# Patient Record
Sex: Male | Born: 1995 | Race: White | Hispanic: No | Marital: Single | Smoking: Never smoker
Health system: Southern US, Community
[De-identification: ages and names within clinical notes are randomized; demographics above are authoritative.]

## PROBLEM LIST (undated history)

## (undated) DIAGNOSIS — F419 Anxiety disorder, unspecified: Secondary | ICD-10-CM

## (undated) DIAGNOSIS — F209 Schizophrenia, unspecified: Secondary | ICD-10-CM

## (undated) DIAGNOSIS — R7989 Other specified abnormal findings of blood chemistry: Secondary | ICD-10-CM

## (undated) DIAGNOSIS — F200 Paranoid schizophrenia: Secondary | ICD-10-CM

## (undated) HISTORY — DX: Schizophrenia, unspecified: F20.9

---

## 2016-10-05 HISTORY — PX: LASIK: SHX215

## 2020-08-22 ENCOUNTER — Ambulatory Visit: Payer: 59 | Admitting: Adult Health

## 2021-01-14 ENCOUNTER — Other Ambulatory Visit: Payer: Self-pay

## 2021-01-14 ENCOUNTER — Encounter: Payer: Self-pay | Admitting: Internal Medicine

## 2021-01-14 ENCOUNTER — Ambulatory Visit (INDEPENDENT_AMBULATORY_CARE_PROVIDER_SITE_OTHER): Payer: 59 | Admitting: Internal Medicine

## 2021-01-14 VITALS — BP 144/94 | HR 91 | Ht 75.0 in | Wt 362.1 lb

## 2021-01-14 DIAGNOSIS — E291 Testicular hypofunction: Secondary | ICD-10-CM

## 2021-01-14 NOTE — Progress Notes (Signed)
Name: Matthew Padilla  MRN/ DOB: [id number], January 09, 1996    Age/ Sex: 25 y.o., male    PCP: Pcp, No   Reason for Endocrinology Evaluation: Hypogonadism      Date of Initial Endocrinology Evaluation: 01/14/2021     HPI: [name] is a 25 y.o. male with a past medical history of Depression . The patient presented for initial endocrinology clinic visit on 01/14/2021 for consultative assistance with his hypogonadism .    He presented to his PCP in 10/2020  For evaluation of severe depression, lack of motivation and mood changes. Mother asked for evaluation for klinefelter syndrome  Which prompted lab work showing normal TSH 1.98 uIU/mL , low total testosterone at 204 ng/dL ( reference  [id number])  but normal free testosterone 14.4 pg/dL ( reference 9.4-70.9) normal LH and FSH at 6.7 and 1.5 respectively.    Of note the pt was first noted with depression when he entered [place] [employer] at age 51 which resulted in expelling him due to the fact that he stopped going to classes. He was not treated for depression until a couple years later when he was admitted for in-patient facility, pt was non compliant with anti depressants after discharge.     Moved from [place] ~ 6 months ago  No learning difficulties  He went through puberty at age 67  He has never been sexually Denies erections No masturbation in 6 months, due to low libido    He does not grow as much facial hair , stable  Has a questionable decrease in testicular size   Weight gain has been consistent through childhood around 4-5th grade  No illicit drugs  Has tobacco use or marijuana use    Has noted decrease in eye sight and headaches   Mother had to go through infertility   Has two sisters   HISTORY:  Past Medical History: No past medical history on file. Past Surgical History:   Social History:  reports that he has never smoked. He has never used smokeless tobacco. He reports previous alcohol use. Family  History: family history is not on file.   HOME MEDICATIONS: Allergies as of 01/14/2021   No Known Allergies     Medication List    as of January 14, 2021  2:20 PM   You have not been prescribed any medications.       REVIEW OF SYSTEMS: A comprehensive ROS was conducted with the patient and is negative except as per HPI     OBJECTIVE:  VS: BP (!) 144/94   Pulse 91   Ht 6\' 3"  (1.905 m)   Wt (!) 362 lb 2 oz (164.3 kg)   SpO2 98%   BMI 45.26 kg/m     Wt Readings from Last 3 Encounters:  01/14/21 (!) 362 lb 2 oz (164.3 kg)     EXAM: General: Pt appears well and is in NAD  Neck: General: Supple without adenopathy. Thyroid: Thyroid size normal.  No goiter or nodules appreciated.  Chest :  B/L glandular breast tissue ~ 2 cm in diameter   Lungs: Clear with good BS bilat with no rales, rhonchi, or wheezes  Heart: Auscultation: RRR.  Abdomen: Normoactive bowel sounds, soft, nontender, without masses or organomegaly palpable  Genital : Testicular size 20 mL bilaterall  Extremities:  BL LE: No pretibial edema normal ROM and strength.  Skin: Hair: Texture and amount normal with gender appropriate distribution Skin Inspection: No rashes,but has pale violet lateral abdominal  striae  Skin Palpation: Skin temperature, texture, and thickness normal to palpation  Neuro:  DTRs: 2+ and symmetric in UE without delay in relaxation phase  Mental Status: Judgment, insight: Intact Orientation: Oriented to time, place, and person Memory: Intact for recent and remote events Mood and affect: No depression, anxiety, or agitation     DATA REVIEWED: Labs pending    ASSESSMENT/PLAN/RECOMMENDATIONS:   Low total serum testosterone:  -Patient with low total serum testosterone at PCPs office but with normal free testosterone.  One of the explanations of low total testosterone is obesity, that would cause low sex hormone binding globulin which ultimately causes a low total serum  testosterone. -I will proceed with checking free testosterone as this is the best way of testing testosterone levels and obesity -I have encouraged the patient to lose weight as that by itself will improve  hormonal imbalances -I am also going to check prolactin level as well as TFTs, LH -They had a concern about Klinefelter syndrome, I did explain to them that the patient does not meet criteria for Klinefelter syndrome as his labs do not show primary hypogonadism and his testicular exam is normal without evidence of fibrosis. -He will come back for fasting 8 AM lab     2. Depression :   -This is a major issue that has been ongoing for years, I have strongly encouraged the patient and mother to seek professional help with depression.   Follow-up in 3 months  Signed electronically by: Lyndle Herrlich, MD  [facility] Endocrinology  [facility] [address] [place], [place] [address] Phone: [phone] FAX: [phone]   CC: Pcp, No No address on file Phone: None Fax: None   Return to Endocrinology clinic as below: No future appointments.

## 2021-01-14 NOTE — Patient Instructions (Signed)
-   Please stop by the lab at 8 AM ( fasting )

## 2021-01-15 DIAGNOSIS — E291 Testicular hypofunction: Secondary | ICD-10-CM | POA: Insufficient documentation

## 2021-01-16 ENCOUNTER — Other Ambulatory Visit: Payer: 59

## 2021-04-22 ENCOUNTER — Ambulatory Visit: Payer: 59 | Admitting: Internal Medicine

## 2021-04-22 NOTE — Progress Notes (Deleted)
   Name: Matthew Padilla  MRN/ DOB: [id number], 31-May-1996    Age/ Sex: 25 y.o., male     PCP: Pcp, No   Reason for Endocrinology Evaluation: ***     Initial Endocrinology Clinic Visit: ***    PATIENT IDENTIFIER: [name] is a 25 y.o., male with a past medical history of ***. He has followed with [facility] since *** for consultative assistance with management of his ***.   HISTORICAL SUMMARY: The patient was first diagnosed with *** at ***, in the setting of ***. Since that time, ***.    SUBJECTIVE:   During last visit (***): ***  Today (04/22/2021):  Mr. Hopes is here for ****   ROS:  As per HPI.   HISTORY:  Past Medical History: No past medical history on file. Past Surgical History: *** The histories are not reviewed yet. Please review them in the "History" navigator section and refresh this SmartLink. Social History:  reports that he has never smoked. He has never used smokeless tobacco. He reports previous alcohol use. No history on file for drug use. Family History: No family history on file.   HOME MEDICATIONS: Allergies as of 04/22/2021   No Known Allergies      Medication List    as of April 22, 2021 11:33 AM   You have not been prescribed any medications.       OBJECTIVE:   PHYSICAL EXAM: VS: There were no vitals taken for this visit.   EXAM: General: Pt appears well and is in NAD  Hydration: Well-hydrated with moist mucous membranes and good skin turgor  Eyes: External eye exam normal without stare, lid lag or exophthalmos.  EOM intact.  PERRL.  Ears, Nose, Throat: Hearing: Grossly intact bilaterally Dental: Good dentition  Throat: Clear without mass, erythema or exudate  Neck: General: Supple without adenopathy. Thyroid: Thyroid size normal.  No goiter or nodules appreciated. No thyroid bruit.  Lungs: Clear with good BS bilat with no rales, rhonchi, or wheezes  Heart: Auscultation: RRR.  Abdomen: Normoactive  bowel sounds, soft, nontender, without masses or organomegaly palpable  Extremities: Gait and station: Normal gait  Digits and nails: No clubbing, cyanosis, petechiae, or nodes Head and neck: Normal alignment and mobility BL UE: Normal ROM and strength. BL LE: No pretibial edema normal ROM and strength.  Skin: Hair: Texture and amount normal with gender appropriate distribution Skin Inspection: No rashes, acanthosis nigricans/skin tags. No lipohypertrophy Skin Palpation: Skin temperature, texture, and thickness normal to palpation  Neuro: Cranial nerves: II - XII grossly intact  Cerebellar: Normal coordination and movement; no tremor Motor: Normal strength throughout DTRs: 2+ and symmetric in UE without delay in relaxation phase  Mental Status: Judgment, insight: Intact Orientation: Oriented to time, place, and person Memory: Intact for recent and remote events Mood and affect: No depression, anxiety, or agitation     DATA REVIEWED: ***    ASSESSMENT / PLAN / RECOMMENDATIONS:   ***  Plan: ***    Medications   ***   Signed electronically by: Lyndle Herrlich, MD  [facility] Endocrinology  [facility] [address] [place] [place], [place] [address] Phone: [phone] FAX: [phone]      CC: Pcp, No No address on file Phone: None  Fax: None   Return to Endocrinology clinic as below: Future Appointments  Date Time Provider Department Center  04/22/2021  1:40 PM Aevah Stansbery, Konrad Dolores, MD [facility]

## 2021-07-08 ENCOUNTER — Emergency Department (HOSPITAL_COMMUNITY): Payer: 59

## 2021-07-08 ENCOUNTER — Emergency Department (HOSPITAL_COMMUNITY)
Admission: EM | Admit: 2021-07-08 | Discharge: 2021-07-09 | Disposition: A | Discharge status: Other Acute Inpatient Hospital | Payer: 59 | Attending: Emergency Medicine | Admitting: Emergency Medicine

## 2021-07-08 ENCOUNTER — Other Ambulatory Visit: Payer: Self-pay

## 2021-07-08 DIAGNOSIS — F209 Schizophrenia, unspecified: Secondary | ICD-10-CM | POA: Diagnosis not present

## 2021-07-08 DIAGNOSIS — R4182 Altered mental status, unspecified: Secondary | ICD-10-CM | POA: Insufficient documentation

## 2021-07-08 DIAGNOSIS — Z046 Encounter for general psychiatric examination, requested by authority: Secondary | ICD-10-CM | POA: Diagnosis present

## 2021-07-08 DIAGNOSIS — Y900 Blood alcohol level of less than 20 mg/100 ml: Secondary | ICD-10-CM | POA: Insufficient documentation

## 2021-07-08 DIAGNOSIS — Z20822 Contact with and (suspected) exposure to covid-19: Secondary | ICD-10-CM | POA: Insufficient documentation

## 2021-07-08 DIAGNOSIS — Z008 Encounter for other general examination: Secondary | ICD-10-CM

## 2021-07-08 LAB — RESP PANEL BY RT-PCR (FLU A&B, COVID) ARPGX2
Influenza A by PCR: NEGATIVE
Influenza B by PCR: NEGATIVE
SARS Coronavirus 2 by RT PCR: NEGATIVE

## 2021-07-08 LAB — COMPREHENSIVE METABOLIC PANEL
ALT: 54 U/L — ABNORMAL HIGH (ref 0–44)
AST: 50 U/L — ABNORMAL HIGH (ref 15–41)
Albumin: 4.8 g/dL (ref 3.5–5.0)
Alkaline Phosphatase: 53 U/L (ref 38–126)
Anion gap: 11 (ref 5–15)
BUN: 14 mg/dL (ref 6–20)
CO2: 25 mmol/L (ref 22–32)
Calcium: 9.5 mg/dL (ref 8.9–10.3)
Chloride: 105 mmol/L (ref 98–111)
Creatinine, Ser: 0.87 mg/dL (ref 0.61–1.24)
GFR, Estimated: >60 mL/min (ref >60)
Glucose, Bld: 80 mg/dL (ref 70–99)
Potassium: 3.6 mmol/L (ref 3.5–5.1)
Sodium: 141 mmol/L (ref 135–145)
Total Bilirubin: 1.7 mg/dL — ABNORMAL HIGH (ref 0.3–1.2)
Total Protein: 7.7 g/dL (ref 6.5–8.1)

## 2021-07-08 LAB — CBC WITH DIFFERENTIAL/PLATELET
Abs Immature Granulocytes: 0.04 10*3/uL (ref 0.00–0.07)
Basophils Absolute: 0.1 10*3/uL (ref 0.0–0.1)
Basophils Relative: 0 %
Eosinophils Absolute: 0 10*3/uL (ref 0.0–0.5)
Eosinophils Relative: 0 %
HCT: 44.9 % (ref 39.0–52.0)
Hemoglobin: 14.3 g/dL (ref 13.0–17.0)
Immature Granulocytes: 0 %
Lymphocytes Relative: 18 %
Lymphs Abs: 2 10*3/uL (ref 0.7–4.0)
MCH: 24.9 pg — ABNORMAL LOW (ref 26.0–34.0)
MCHC: 31.8 g/dL (ref 30.0–36.0)
MCV: 78.1 fL — ABNORMAL LOW (ref 80.0–100.0)
Monocytes Absolute: 0.7 10*3/uL (ref 0.1–1.0)
Monocytes Relative: 6 %
Neutro Abs: 8.6 10*3/uL — ABNORMAL HIGH (ref 1.7–7.7)
Neutrophils Relative %: 76 %
Platelets: 280 10*3/uL (ref 150–400)
RBC: 5.75 MIL/uL (ref 4.22–5.81)
RDW: 15.4 % (ref 11.5–15.5)
WBC: 11.4 10*3/uL — ABNORMAL HIGH (ref 4.0–10.5)
nRBC: 0 % (ref 0.0–0.2)

## 2021-07-08 LAB — ETHANOL: Alcohol, Ethyl (B): <10 mg/dL (ref <10)

## 2021-07-08 MED ORDER — LORAZEPAM 2 MG/ML IJ SOLN
2.0000 mg | Freq: Once | INTRAMUSCULAR | Status: AC
  Administered 2021-07-08: 2 mg via INTRAMUSCULAR
  Filled 2021-07-08: qty 1

## 2021-07-08 MED ORDER — OLANZAPINE 10 MG IM SOLR
10.0000 mg | Freq: Once | INTRAMUSCULAR | Status: AC
  Administered 2021-07-08: 10 mg via INTRAMUSCULAR
  Filled 2021-07-08: qty 10

## 2021-07-08 NOTE — BH Assessment (Addendum)
Comprehensive Clinical Assessment (CCA) Note  07/08/2021 Matthew Padilla [id number]  Disposition: TTS completed. Per Pecolia Ades, NP, patient meets criteria for inpatient psychiatric treatment. Disposition Counselor/LCSW will seek appropriate bed placement. [place]-SUICIDE SEVERITY RATING SCALE (C-SSRS) completed and patient scored, "No Risk".   [facility] ED from 07/08/2021 in [facility] DEPT  C-SSRS RISK CATEGORY No Risk      The patient demonstrates the following risk factors for suicide: Chronic risk factors for suicide include: psychiatric disorder of Schizophrenia . Acute risk factors for suicide include:  unknown due to mental state; patient unable to provide appropriate answers . Protective factors for this patient include:  none reported . Considering these factors, the overall suicide risk at this point appears to be "No Risk". Patient is not appropriate for outpatient follow up.   Chief Complaint:  Chief Complaint  Patient presents with   Medical Clearance   Psychiatric Evaluation   Visit Diagnosis: Schizophrenia    Matthew Padilla is a 25 y/o male that presents to [facility]. Tele assessment order was placed. However, prior to assessing the patient clinician reviewed ED notes which indicated:   "Matthew Padilla is a 25 y.o. male.  With previous diagnosis of schizophrenia in 2019 who presents to the emergency department for medical clearance. Unable to obtain any information from the patient.  Per officers at bedside patient was found roaming around the neighborhood and pulling on neighbors doors.  Apparently a neighbor came home with her daughter and found the patient inside of the house standing in the living room.  She called 911.  Apparently the patient lives down the street and the mother was home at the time. Per Felicita Gage, please officer, he spoke with the mother who states that he was diagnosed with schizophrenia in 2019.  He is not  currently on any medication.  He apparently has not been aggressive recently, but has been aggressive once in [place] where they are from.  He is only ever taken antidepressants before previously.  Officer states that while they were on scene he continue to try and open other police car doors and saying "voices were talking to him". On my exam the patient is pacing in the room and is laughing.  He does not answer most of my questions.  He only shakes his head at me.  He does state that he is not in any pain at this time.  He states that he is not suicidal or homicidal.  He does not answer when I ask him about auditory or visual hallucinations however he does appear to be responding to internal stimuli in the room.  He continues to open and close the door throughout the exam.  He is otherwise not in any acute distress."  Clinician met with patient via tele assessment. Initially patient refused to speak only responding to this Clinician with shoulder shrugs. He was encouraged to participate in the assessment several times and eventually engaged in conversation. Patient responded to questions with inappropriate responses, "Yes", and "No's". His presentation is bizzare. He appears restless as he observed pacing the room.   Patient asked about his current living arrangements and states: "I would never believe but me 2 sisters".  Also, that he has a "Fake orange doctor I never trusted". Patient asked about current suicidal ideations and indicates, "If I did I would have failed".  Current stress, "My train leaving [place] and ending up in [place]". Denies self-mutilating behaviors. He says that his auditory hallucinations sound like  a "motel". Clinician was unable to make sense of patients responses in relation to the questions asked.    Patient did not confirm SI, HI, and/or AVH's. Also, did not confirm any related alcohol and/or drug use.   CCA Screening, Triage and Referral (STR)  Patient Reported  Information How did you hear about [place]? Family/Friend  What Is the Reason for Your Visit/Call Today? Matthew Padilla is a 25 y.o. male.  With previous diagnosis of schizophrenia in 2019 who presents to the emergency department for medical clearance.   Unable to obtain any information from the patient.  Per officers at bedside patient was found roaming around the neighborhood and pulling on neighbors' doors.  Apparently, a neighbor came home with her daughter and found the patient inside of the house standing in the living room.  She called 911.  Apparently, the patient lives down the street and the mother were home at the time.  Per Felicita Gage, please officer, he spoke with the mother who states that he was diagnosed with schizophrenia in 2019.  He is not currently on any medication.  He apparently has not been aggressive recently, but has been aggressive once in [place] where they are from.  He is only ever taken antidepressants before previously.  Officer states that while they were on scene he continues to try and open other police car doors and saying "voices were talking to him".   On my exam the patient is pacing in the room and is laughing.  He does not answer any my questions.  He only shakes his head at me.  He does state that he is not in any pain currently.  He states that he is not suicidal or homicidal.  He does not answer when I ask him about auditory or visual hallucinations however he does appear to be responding to internal stimuli in the room.  He continues to open and close the door throughout the exam.  He is otherwise not in any acute distress.  I would never believe it and 2 sisters.  Fake orange doctor I never trusted. If I did I would have failed. Stress: train leaving [place] and ending up in [place]. Denies self-mutilating behaviors. I send them to [place] to know they were to early when asked about family hx. Auditory- the motel. Alcohol and drug use only when the elevation is to high.  Hobby drive down to [place], [place] not when I'm there.   Highest level of education-just walking up the stairs; support system - left side.   The [place], [place]. August in [place], wedding, progressed worse. Not communicating, Not answering questions, looking for work, "rewire his brain". 2019, more depression and suicidal. No hx of any suicide attempts and/gestures.    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 Ordered gummies last week from the internet.  Moved September 11.  How Long Has This Been Causing You Problems? > than 6 months  What Do You Feel Would Help You the Most Today? Treatment for Depression or other mood problem   Have You Recently Had Any Thoughts About Hurting Yourself? No  Are You Planning to Commit Suicide/Harm Yourself At This time? No   Have you Recently Had Thoughts About [place]? No  Are You Planning to Harm Someone at This Time? No  Explanation: No data recorded  Have You Used Any Alcohol or Drugs in the Past 24 Hours? No  How Long Ago Did You Use Drugs or Alcohol? No data recorded  What Did You Use and How Much? No data recorded  Do You Currently Have a Therapist/Psychiatrist? No  Name of Therapist/Psychiatrist: No data recorded  Have You Been Recently Discharged From Any Office Practice or Programs? No  Explanation of Discharge From Practice/Program: No data recorded    CCA Screening Triage Referral Assessment Type of Contact: Tele-Assessment  Telemedicine Service Delivery:   Is this Initial or Reassessment? Initial Assessment  Date Telepsych consult ordered in [facility]:  07/08/21  Time Telepsych consult ordered in [facility]:  No data recorded Location of Assessment: [facility] ED  Provider Location: [facility] Assessment Services   Collateral Involvement: No data recorded  Does Patient Have a Court Appointed Legal Guardian? No data recorded Name and Contact of Legal Guardian: No data recorded If Minor and Not Living with Parent(s), Who has Custody? No  data recorded Is CPS involved or ever been involved? Never  Is APS involved or ever been involved? Never   Patient Determined To Be At Risk for Harm To Self or Others Based on Review of Patient Reported Information or Presenting Complaint? No  Method: No data recorded Availability of Means: No data recorded Intent: No data recorded Notification Required: No data recorded Additional Information for Danger to Others Potential: No data recorded Additional Comments for Danger to Others Potential: No data recorded Are There Guns or Other Weapons in Your Home? No data recorded Types of Guns/Weapons: No data recorded Are These Weapons Safely Secured?                            No data recorded Who Could Verify You Are Able To Have These Secured: No data recorded Do You Have any Outstanding Charges, Pending Court Dates, Parole/Probation? No data recorded Contacted To Inform of Risk of Harm To Self or Others: No data recorded   Does Patient Present under Involuntary Commitment? No  IVC Papers Initial File Date: No data recorded  [place] of Residence: [place]   Patient Currently Receiving the Following Services: -- (Patient has no services in place at this time.)   Determination of Need: Emergent (2 hours)   Options For Referral: Medication Management; Inpatient Hospitalization; Other: Comment (ACTT services)     CCA Biopsychosocial Patient Reported Schizophrenia/Schizoaffective Diagnosis in Past: No   Strengths: unknown   Mental Health Symptoms Depression:   Change in energy/activity; Irritability   Duration of Depressive symptoms:  Duration of Depressive Symptoms: Greater than two weeks   Mania:   None   Anxiety:    Difficulty concentrating; Worrying; Irritability   Psychosis:   Grossly disorganized or catatonic behavior; Delusions; Affective flattening/alogia/avolition; Grossly disorganized speech; Hallucinations; Other negative symptoms   Duration of Psychotic  symptoms:  Duration of Psychotic Symptoms: N/A   Trauma:   N/A   Obsessions:   N/A   Compulsions:   N/A   Inattention:   N/A   Hyperactivity/Impulsivity:   N/A   Oppositional/Defiant Behaviors:   N/A   Emotional Irregularity:   N/A   Other Mood/Personality Symptoms:  No data recorded   Mental Status Exam Appearance and self-care  Stature:   Average   Weight:   Average weight   Clothing:   Neat/clean   Grooming:   Normal   Cosmetic use:   None   Posture/gait:   Normal   Motor activity:   Not Remarkable   Sensorium  Attention:   Normal   Concentration:   Normal   Orientation:  Time; Situation; Place; Person; Object   Recall/memory:   Normal   Affect and Mood  Affect:   Depressed; Flat   Mood:   Depressed   Relating  Eye contact:   None   Facial expression:   Depressed   Attitude toward examiner:   Cooperative   Thought and Language  Speech flow:  Clear and Coherent   Thought content:   Appropriate to Mood and Circumstances   Preoccupation:   None   Hallucinations:   None   Organization:  No data recorded  [employer] of Knowledge:  No data recorded  Intelligence:   Average   Abstraction:   Normal   Judgement:   Normal   Reality Testing:   Adequate   Insight:   Poor; Lacking   Decision Making:   Impulsive; Paralyzed; Confused   Social Functioning  Social Maturity:   Impulsive   Social Judgement:   Heedless   Stress  Stressors:   Other (Comment) (Stress: train leaving [place] and ending up in [place].)   Coping Ability:   Normal; Overwhelmed; Exhausted   Skill Deficits:   Communication; Decision making   Supports:   Church     Religion: Religion/Spirituality Are You A Religious Person?: No  Leisure/Recreation: Leisure / Recreation Do You Have Hobbies?: Yes Leisure and Hobbies: "Going  Exercise/Diet: Exercise/Diet Do You Exercise?: No Have You Gained or Lost A  Significant Amount of Weight in the Past Six Months?: No Do You Follow a Special Diet?: No (unknown) Do You Have Any Trouble Sleeping?:  (unknown)   CCA Employment/Education Employment/Work Situation: Employment / Work Situation Employment Situation: Unemployed Patient's Job has Been Impacted by Current Illness: No Has Patient ever Been in Passenger transport manager?: No  Education: Education Is Patient Currently Attending School?: No Did Physicist, medical?: No Did You Have An Individualized Education Program (IIEP): No Did You Have Any Difficulty At [employer]?: No Patient's Education Has Been Impacted by Current Illness: No   CCA Family/Childhood History Family and Relationship History: Family history Marital status: Single Does patient have children?: No  Childhood History:  Childhood History By whom was/is the patient raised?: Both parents Did patient suffer any verbal/emotional/physical/sexual abuse as a child?: No Did patient suffer from severe childhood neglect?: No Has patient ever been sexually abused/assaulted/raped as an adolescent or adult?: No Was the patient ever a victim of a crime or a disaster?: No Witnessed domestic violence?: No Has patient been affected by domestic violence as an adult?: No  Child/Adolescent Assessment:     CCA Substance Use Alcohol/Drug Use: Alcohol / Drug Use Pain Medications: SEE MAR Prescriptions: SEE MAR Over the Counter: SEE MAR History of alcohol / drug use?: Yes Substance #1 Name of Substance 1: Per collateral atient has a hx of THC use 1 - Age of First Use: unk 1 - Amount (size/oz): unk 1 - Frequency: unk 1 - Duration: unk 1 - Last Use / Amount: unk 1 - Method of Aquiring: unk 1- Route of Use: unk                       ASAM's:  Six Dimensions of Multidimensional Assessment  Dimension 1:  Acute Intoxication and/or Withdrawal Potential:      Dimension 2:  Biomedical Conditions and Complications:      Dimension 3:   Emotional, Behavioral, or Cognitive Conditions and Complications:     Dimension 4:  Readiness to Change:     Dimension  5:  Relapse, Continued use, or Continued Problem Potential:     Dimension 6:  Recovery/Living Environment:     ASAM Severity Score:    ASAM Recommended Level of Treatment:     Substance use Disorder (SUD) Substance Use Disorder (SUD)  Checklist Symptoms of Substance Use:  (none reported)  Recommendations for Services/Supports/Treatments: Recommendations for Services/Supports/Treatments Recommendations For Services/Supports/Treatments: ACCTT (Assertive Community Treatment), Inpatient Hospitalization, Medication Management  Discharge Disposition:    DSM5 Diagnoses: Patient Active Problem List   Diagnosis Date Noted   Hypogonadism in male 01/15/2021     Referrals to Alternative Service(s): Referred to Alternative Service(s):   Place:   Date:   Time:    Referred to Alternative Service(s):   Place:   Date:   Time:    Referred to Alternative Service(s):   Place:   Date:   Time:    Referred to Alternative Service(s):   Place:   Date:   Time:     Waldon Merl, Counselor

## 2021-07-08 NOTE — Consult Note (Signed)
Matthew Padilla is 26 year old male seen by TTS counselor for psychiatric evaluation. Counselor unable to obtain meaningful information from patient, and with patient's consent spoke with patients mother, Matthew Padilla.   This provider was present during this conversation (with mother;s knowledge) to ask about patient's symptoms.  Mother reports this is the first episode that patient has behaved in this manner. Explained to mother, it is protocol to order CT Head to rule out physical cause for new onset psychosis.  Mother agrees with plan.    CT Head ordered - new onset psychosis

## 2021-07-08 NOTE — ED Notes (Signed)
Unable to do patient EKG RN aware pt refused

## 2021-07-08 NOTE — ED Triage Notes (Signed)
Patient not answering questions 

## 2021-07-08 NOTE — BH Assessment (Addendum)
@  1425, requested Addison Naegeli, RN) to place the TTS machine in patient's room.

## 2021-07-08 NOTE — ED Provider Notes (Signed)
[facility]-EMERGENCY DEPT Provider Note   CSN: [id number] Arrival date & time: 07/08/21  1322     History Chief Complaint  Patient presents with   Medical Clearance   Level 5 caveat: Altered mental status Matthew Padilla is a 25 y.o. male.  With previous diagnosis of schizophrenia in 2019 who presents to the emergency department for medical clearance.  Unable to obtain any information from the patient.  Per officers at bedside patient was found roaming around the neighborhood and pulling on neighbors doors.  Apparently a neighbor came home with her daughter and found the patient inside of the house standing in the living room.  She called 911.  Apparently the patient lives down the street and the mother was home at the time.  Per Donney Dice, please officer, he spoke with the mother who states that he was diagnosed with schizophrenia in 2019.  He is not currently on any medication.  He apparently has not been aggressive recently, but has been aggressive once in [place] where they are from.  He is only ever taken antidepressants before previously.  Officer states that while they were on scene he continue to try and open other police car doors and saying "voices were talking to him".  On my exam the patient is pacing in the room and is laughing.  He does not answer most of my questions.  He only shakes his head at me.  He does state that he is not in any pain at this time.  He states that he is not suicidal or homicidal.  He does not answer when I ask him about auditory or visual hallucinations however he does appear to be responding to internal stimuli in the room.  He continues to open and close the door throughout the exam.  He is otherwise not in any acute distress.  HPI     No past medical history on file.  Patient Active Problem List   Diagnosis Date Noted   Hypogonadism in male 01/15/2021   No family history on file.  Social History   Tobacco Use    Smoking status: Never   Smokeless tobacco: Never  Substance Use Topics   Alcohol use: Not Currently    Home Medications Prior to Admission medications   Not on File    Allergies    Patient has no known allergies.  Review of Systems   Review of Systems  Psychiatric/Behavioral:  Positive for behavioral problems.   All other systems reviewed and are negative.  Physical Exam Updated Vital Signs BP (!) 145/70   Pulse (!) 131   Temp 98.8 F (37.1 C) (Oral)   Resp 20   Ht 6\' 3"  (1.905 m)   Wt (!) 164.2 kg   SpO2 95%   BMI 45.25 kg/m   Physical Exam Vitals and nursing note reviewed. Exam conducted with a chaperone present.  Constitutional:      General: He is not in acute distress. HENT:     Head: Normocephalic and atraumatic.  Eyes:     General: No scleral icterus. Cardiovascular:     Pulses: Normal pulses.  Pulmonary:     Effort: Pulmonary effort is normal. No respiratory distress.  Musculoskeletal:        General: Normal range of motion.     Cervical back: Normal range of motion.  Skin:    Capillary Refill: Capillary refill takes less than 2 seconds.     Findings: No rash.  Neurological:  General: No focal deficit present.     Mental Status: He is alert. He is disoriented.  Psychiatric:        Attention and Perception: He is inattentive.        Mood and Affect: Mood is elated. Affect is inappropriate.        Speech: Speech is tangential.        Behavior: Behavior is hyperactive. Behavior is not aggressive. Behavior is cooperative.        Thought Content: Thought content normal. Thought content does not include homicidal or suicidal ideation.        Cognition and Memory: Cognition is impaired.        Judgment: Judgment is impulsive and inappropriate.    ED Results / Procedures / Treatments   Labs (all labs ordered are listed, but only abnormal results are displayed) Labs Reviewed  RESP PANEL BY RT-PCR (FLU A&B, COVID) ARPGX2  COMPREHENSIVE METABOLIC  PANEL  ETHANOL  RAPID URINE DRUG SCREEN, HOSP PERFORMED  CBC WITH DIFFERENTIAL/PLATELET   EKG None  Radiology No results found.  Procedures Procedures   Medications Ordered in ED Medications  LORazepam (ATIVAN) injection 2 mg (2 mg Intramuscular Given 07/08/21 1452)    ED Course  I have reviewed the triage vital signs and the nursing notes.  Pertinent labs & imaging results that were available during my care of the patient were reviewed by me and considered in my medical decision making (see chart for details).  1500: IVC first exam filled out and signed by Dr. Rhunette Croft.  She arrived via [place] [employer] under IVC petition. MDM Rules/Calculators/A&P At time of handoff patient is still pending medical clearance. His IVC first exam has been completed and signed by Dr. Rhunette Croft. He does have some psychotic behavior when trying to assess him.  He became agitated with staff when they attempted to draw lab work.  He was given 2 mg IM Ativan to attempt to calm him down in order to finish his medical clearance work-up.  Handed off to [place], PA-C Final Clinical Impression(s) / ED Diagnoses Final diagnoses:  Medical clearance for psychiatric admission   Rx / DC Orders ED Discharge Orders     None        Cristopher Peru, PA-C 07/08/21 1541    Derwood Kaplan, MD 07/18/21 0715

## 2021-07-08 NOTE — ED Notes (Signed)
Due to previous behaviors vitals will be taken when pt wakes up. Pt is resting with eyes closed. Respirations even and unlabored.

## 2021-07-08 NOTE — ED Provider Notes (Signed)
Patient taken at shift handoff.  He has a history of schizophrenia, exhibiting bizarre behavior today, IVC by his mother.  Patient is currently responding to internal stimuli.  Mild agitation earlier treated with Ativan.  Currently awaiting medical clearance.   Patient labs reviewed, Medically clear.   Arthor Captain, PA-C 07/09/21 1609    Milagros Loll, MD 07/10/21 1145

## 2021-07-09 LAB — HIV ANTIBODY (ROUTINE TESTING W REFLEX): HIV Screen 4th Generation wRfx: NONREACTIVE

## 2021-07-09 LAB — RAPID URINE DRUG SCREEN, HOSP PERFORMED
Amphetamines: NOT DETECTED
Barbiturates: NOT DETECTED
Benzodiazepines: POSITIVE — AB
Cocaine: NOT DETECTED
Opiates: NOT DETECTED
Tetrahydrocannabinol: POSITIVE — AB

## 2021-07-09 LAB — FOLATE: Folate: 17.6 ng/mL (ref >5.9)

## 2021-07-09 LAB — VITAMIN B12: Vitamin B-12: 741 pg/mL (ref 180–914)

## 2021-07-09 LAB — TSH: TSH: 2.016 u[IU]/mL (ref 0.350–4.500)

## 2021-07-09 MED ORDER — DIPHENHYDRAMINE HCL 25 MG PO CAPS
50.0000 mg | ORAL_CAPSULE | Freq: Four times a day (QID) | ORAL | Status: DC | PRN
  Administered 2021-07-09: 50 mg via ORAL
  Filled 2021-07-09: qty 2

## 2021-07-09 MED ORDER — HALOPERIDOL 5 MG PO TABS
10.0000 mg | ORAL_TABLET | Freq: Two times a day (BID) | ORAL | Status: DC
  Administered 2021-07-09: 10 mg via ORAL
  Filled 2021-07-09: qty 2

## 2021-07-09 MED ORDER — ZIPRASIDONE MESYLATE 20 MG IM SOLR
20.0000 mg | Freq: Once | INTRAMUSCULAR | Status: AC
  Administered 2021-07-09: 20 mg via INTRAMUSCULAR
  Filled 2021-07-09: qty 20

## 2021-07-09 MED ORDER — LORAZEPAM 1 MG PO TABS
2.0000 mg | ORAL_TABLET | Freq: Two times a day (BID) | ORAL | Status: DC
  Administered 2021-07-09: 2 mg via ORAL
  Filled 2021-07-09: qty 2

## 2021-07-09 MED ORDER — LORAZEPAM 2 MG/ML IJ SOLN: 2.0000 mg | Freq: Two times a day (BID) | INTRAMUSCULAR | Status: DC

## 2021-07-09 MED ORDER — DIPHENHYDRAMINE HCL 50 MG/ML IJ SOLN: 50.0000 mg | Freq: Four times a day (QID) | INTRAMUSCULAR | Status: DC | PRN

## 2021-07-09 MED ORDER — HALOPERIDOL LACTATE 5 MG/ML IJ SOLN: 10.0000 mg | Freq: Two times a day (BID) | INTRAMUSCULAR | Status: DC

## 2021-07-09 NOTE — ED Notes (Signed)
Pt continues to walk around room. Pt rambling, nonsensical at times. Limited insight.  Continues to ask about leaving. Pt redirected and reassured.

## 2021-07-09 NOTE — ED Notes (Signed)
Pt off unit to facility per provider. Pt alert, cooperative, no s/s of distress. DC d information and belongings given to sheriff for transport. . Pt ambulatory off unit , escorted and transported by sheriff.
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
## 2021-07-09 NOTE — ED Notes (Signed)
Pt is continuously pacing room and trying to come out of his room agitated holding his head.

## 2021-07-09 NOTE — Progress Notes (Signed)
Inpatient Behavioral Health Placement  Pt meets inpatient criteria per Dorena Bodo, NP.  Per [facility] Fransico Michael, RN there are no appropriate beds. Referral was sent to the following out of network facilities:   [facility] Provider Address Phone Fax  [facility]  [address] [place] [address] [phone]  [facility]  [address] [place] [address] [phone]  [facility]  [address] [place] [address] [phone]  [facility]  [address] [place] [address] [phone]  [facility]  [address] [place] [address] [phone]  [facility]  [address] [place] [address] [phone]  [facility]  [address], [place] [address] [phone]  [facility]  [address] [place] [address] [phone]  [facility]  [address], [place] [address] [phone]  [facility]  [address] [place] [address] [phone]  [facility]  [address], [place] [address] [phone]  [facility]  [address], [place] [address] [phone]  [facility]  [address] [place] [address] [phone]  [facility]  [phone] [place] [address] [phone]      Situation ongoing,  CSW will follow up.   Maryjean Ka, MSW, LCSWA 07/09/2021  @ 12:24 AM

## 2021-07-09 NOTE — ED Provider Notes (Signed)
Emergency Medicine Observation Re-evaluation Note  Matthew Padilla is a 25 y.o. male, seen on rounds today.  Pt initially presented to the ED for complaints of Medical Clearance and Psychiatric Evaluation Currently, the patient is ivc  Physical Exam  BP 122/72 (BP Location: Right Arm)   Pulse 85   Temp (!) 97.3 F (36.3 C) (Oral)   Resp 20   Ht 6\' 3"  (1.905 m)   Wt (!) 164.2 kg   SpO2 98%   BMI 45.25 kg/m  Physical Exam Awake alert  ED Course / MDM  EKG:   I have reviewed the labs performed to date as well as medications administered while in observation.  Plan  Current plan is for placement [name] is under involuntary commitment.      Matthew Belling, MD 07/09/21 [id number]

## 2021-07-09 NOTE — ED Notes (Signed)
PRN meds given per [facility] for agitation

## 2021-07-09 NOTE — ED Notes (Signed)
Pt passing in the room . Trying to walk out.  Pt needs redirection. Pt disorganized.

## 2021-07-09 NOTE — BH Assessment (Signed)
[facility] Assessment Progress Note   Per Caryn Bee, NP, this pt requires psychiatric hospitalization at this time.  Pt presents under IVC initiated by law enforcement and upheld by EDP Marianna Fuss, MD.  At 14:55 Gaynelle Adu calls from [facility] to report that pt has been accepted to their facility by Dr Forrestine Him to the [facility] unit.  EDP Bethann Berkshire, MD and pt's nurse, Waynetta Sandy, have been notified, and Whitney Post agrees to call report to [phone].  Pt is to be transported via [facility].   Doylene Canning Behavioral Health Coordinator [phone]

## 2021-07-10 LAB — GC/CHLAMYDIA PROBE AMP (~~LOC~~) NOT AT ARMC
Chlamydia: NEGATIVE
Comment: NEGATIVE
Comment: NORMAL
Neisseria Gonorrhea: NEGATIVE

## 2021-07-10 LAB — RPR: RPR Ser Ql: NONREACTIVE

## 2021-07-23 ENCOUNTER — Ambulatory Visit (HOSPITAL_COMMUNITY)
Admission: EM | Admit: 2021-07-23 | Discharge: 2021-07-23 | Disposition: A | Discharge status: Home / Self Care | Payer: 59 | Attending: Family | Admitting: Family

## 2021-07-23 DIAGNOSIS — F209 Schizophrenia, unspecified: Secondary | ICD-10-CM | POA: Diagnosis not present

## 2021-07-23 MED ORDER — OLANZAPINE 5 MG PO TABS: 5.0000 mg | ORAL_TABLET | Freq: Two times a day (BID) | ORAL | 0 refills | Status: DC

## 2021-07-23 NOTE — ED Provider Notes (Signed)
Behavioral Health Urgent Care Medical Screening Exam  Patient Name: Matthew Padilla MRN: [id number] Date of Evaluation: 07/23/21 Chief Complaint:   Diagnosis:  Final diagnoses:  Schizophrenia, unspecified type (HCC)    History of Present illness: Matthew Padilla is a 25 y.o. male.  Patient presents voluntarily to [facility] behavioral health for walk-in assessment.  He is accompanied by his mother, Raynelle Fanning, who remains present during assessment with permission of patient.  Patient reports he is currently seeking to establish outpatient psychiatric care [place] area.  He was discharged from old [facility] on yesterday after a 1 week admission.  During this admission he has been diagnosed with schizophrenia and prescribed Haldol 10 mg twice daily and sertraline 50 mg nightly.  He reports he began taking Haldol about 10 days ago.  Patient noted to be actively drooling, reports drooling began 4 days ago. Traveon also has been diagnosed with mood disorder in the distant past.  He reports 1 previous inpatient psychiatric hospitalization in 2019.  This hospitalization stems from suicidal ideations.  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  He has not been linked with outpatient psychiatry since relocating to [place] 1 year ago.  Patient denies shortness of breath, denies chest pain, denies dizziness.  Heart rate elevated at 102.  EKG completed, QTC measures 449.  Patient is assessed face-to-face by nurse practitioner.  He is seated in assessment area, no acute distress.  He is alert and oriented, pleasant and cooperative during assessment.  He presents with euthymic mood. He denies suicidal and homicidal ideations.  He denies history of self-harm. He contracts verbally for safety with this Clinical research associate.   He has slowed speech and normal behavior.  He denies both auditory and visual hallucinations.  Patient is able to converse coherently with  goal-directed thoughts and no distractibility or preoccupation.  He denies paranoia.  Objectively there is no evidence of psychosis/mania or delusional thinking.  Patient resides in [place] with his mother, denies access to weapons.  He endorses average sleep and decreased appetite.  He endorses history of CBD gummy use, last use approximately 10 days ago.  He is currently not employed but looking forward to attending [employer] beginning in January 2023.  Patient offered support and encouragement.  Patient and his mother agree with plan to discontinue haloperidol and begin using olanzapine 5 mg twice daily.  Patient and mother aware of side effects and directed to return to emergent care area immediately for continued concerns or symptoms of psychosis.   Psychiatric Specialty Exam  Presentation  General Appearance:Appropriate for Environment; Casual  Eye Contact:Good  Speech:Clear and Coherent; Normal Rate  Speech Volume:Normal  Handedness:Right   Mood and Affect  Mood:Euthymic  Affect:Appropriate; Congruent   Thought Process  Thought Processes:Coherent; Goal Directed; Linear  Descriptions of Associations:Intact  Orientation:Full (Time, Place and Person)  Thought Content:Logical; WDL  Diagnosis of Schizophrenia or Schizoaffective disorder in past: Yes  Duration of Psychotic Symptoms: Less than six months  Hallucinations:None  Ideas of Reference:None  Suicidal Thoughts:No  Homicidal Thoughts:No   Sensorium  Memory:Immediate Good; Recent Good; Remote Good  Judgment:Good  Insight:Fair   Executive Functions  Concentration:Good  Attention Span:Good  Recall:Good  Fund of Knowledge:Good  Language:Good   Psychomotor Activity  Psychomotor Activity:Decreased   Assets  Assets:Communication Skills; Desire for Improvement; Financial Resources/Insurance; Housing; Intimacy; Leisure Time; Physical Health; Resilience; Transportation; Talents/Skills; Social  Support   Sleep  Sleep:Good  Number of hours:  No data recorded  No data recorded  Physical Exam: Physical Exam Vitals and nursing note reviewed.  Constitutional:      Appearance: Normal appearance. He is well-developed. He is obese.  HENT:     Head: Normocephalic and atraumatic.     Nose: Nose normal.     Mouth/Throat:     Comments: Drooling noted, patient aware- began approx 4 days ago Cardiovascular:     Rate and Rhythm: Normal rate.  Pulmonary:     Effort: Pulmonary effort is normal.  Musculoskeletal:        General: Normal range of motion.     Cervical back: Normal range of motion.  Skin:    General: Skin is warm and dry.  Neurological:     Mental Status: He is alert and oriented to person, place, and time.  Psychiatric:        Attention and Perception: Attention and perception normal.        Mood and Affect: Mood normal. Affect is flat.        Speech: Speech normal.        Behavior: Behavior normal. Behavior is cooperative.        Thought Content: Thought content normal.        Cognition and Memory: Cognition and memory normal.        Judgment: Judgment normal.   Review of Systems  Constitutional: Negative.   HENT: Negative.    Eyes: Negative.   Respiratory: Negative.    Cardiovascular: Negative.   Gastrointestinal: Negative.   Genitourinary: Negative.   Musculoskeletal: Negative.   Skin: Negative.   Neurological: Negative.   Endo/Heme/Allergies: Negative.   Psychiatric/Behavioral: Negative.    Blood pressure (!) 135/96, pulse (!) 102, temperature 98.3 F (36.8 C), temperature source Oral, resp. rate 16, SpO2 98 %. There is no height or weight on file to calculate BMI.  Musculoskeletal: Strength & Muscle Tone: within normal limits Gait & Station: normal Patient leans: N/A   [facility] MSE Discharge Disposition for Follow up and Recommendations: Based on my evaluation the patient does not appear to have an emergency medical condition and can be  discharged with resources and follow up care in outpatient services for Medication Management and Individual Therapy Patient reviewed with Dr. Bronwen Betters. Haloperidol discontinued. Follow-up with outpatient psychiatry at [facility] behavioral health.  Current medications: -Olanzapine 5 mg twice daily -Sertraline 50 mg nightly   Lenard Lance, FNP 07/23/2021, 9:44 AM

## 2021-07-23 NOTE — ED Notes (Signed)
Pt discharged with resources in hand to include follow up recommendations and RX's. Safety maintained.

## 2021-07-23 NOTE — Discharge Instructions (Signed)

## 2021-07-23 NOTE — Progress Notes (Signed)
   07/23/21 0841  [facility] Triage Screening (Walk-ins at [facility] - In[facility] only)  How Did You Hear About [place]? Family/Friend  What Is the Reason for Your Visit/Call Today? Pt presented voluntarily and accompanied by his mother, Daegen Berrocal, due to unwanted side effects of medications pt was prescribed at [facility] recently. Pt was at [facility] from 10/4-10/18/22 having been discharged yesterday.Pt gave verbal permission fhis mother to participate in this session. Per mother, pt is not at his baseline as he is having disorganized thought and speech, slower speech and movement, excessive swaeting and drooling. Pt denied SI, HI, AVH, NSSH and any drug/alcohol use. Pt stated he does at times believe he is dicating some paranoia. Pt and mom are requesting a medication adjustment. Pt with mother's assistance are initiaing services to see providers at [facility] OP. Mother and pt stated that he has been taking his prescribed medications as they are prescribed.  How Long Has This Been Causing You Problems? 1 wk - 1 month  Have You Recently Had Any Thoughts About Hurting Yourself? No  Are You Planning to Commit Suicide/Harm Yourself At This time? No  Have you Recently Had Thoughts About Hurting Someone Karolee Ohs? No  Are You Planning To Harm Someone At This Time? No  Are you currently experiencing any auditory, visual or other hallucinations? No (Denies)  Have You Used Any Alcohol or Drugs in the Past 24 Hours? No  Do you have any current medical co-morbidities that require immediate attention? No  Clinician description of patient physical appearance/behavior: Pt was dressed casually and appeared adequately groomed. Pt's speech much slower than his baseline and mother stated that his speech and thought content were disorganized. Pt was very slow to answer questions and his movements, although few, were very slow and pronounced. Mother stated that his behavior was well off his baseline.  What Do You Feel Would Help You the Most Today?  Medication(s)  If access to [facility] was not available, would you have sought care in the Emergency Department? Yes  Determination of Need Routine (7 days) (Requesting medication adjustment.)  Options For Referral Outpatient Therapy;Medication Management  Alvan Culpepper T. Jimmye Norman, MS, [facility], [facility] Triage Specialist [facility]

## 2021-07-24 ENCOUNTER — Telehealth (HOSPITAL_COMMUNITY): Payer: Self-pay | Admitting: Internal Medicine

## 2021-07-24 NOTE — BH Assessment (Signed)
Care Management - Follow Up [facility] Discharges   Writer attempted to make contact with patient today and was unsuccessful.  Writer left a HIPPA compliant voice message.   Per chart review, Follow-up with outpatient psychiatry at [facility] was given upon discharge.

## 2021-07-26 ENCOUNTER — Ambulatory Visit (HOSPITAL_COMMUNITY): Payer: 59 | Admitting: Psychiatry

## 2021-07-29 ENCOUNTER — Other Ambulatory Visit: Payer: Self-pay

## 2021-07-29 ENCOUNTER — Telehealth (HOSPITAL_COMMUNITY): Payer: Self-pay | Admitting: *Deleted

## 2021-07-29 ENCOUNTER — Encounter (HOSPITAL_COMMUNITY): Payer: Self-pay | Admitting: Psychiatry

## 2021-07-29 ENCOUNTER — Telehealth (HOSPITAL_BASED_OUTPATIENT_CLINIC_OR_DEPARTMENT_OTHER): Payer: 59 | Admitting: Psychiatry

## 2021-07-29 VITALS — Wt 333.0 lb

## 2021-07-29 DIAGNOSIS — F419 Anxiety disorder, unspecified: Secondary | ICD-10-CM | POA: Diagnosis not present

## 2021-07-29 DIAGNOSIS — F1210 Cannabis abuse, uncomplicated: Secondary | ICD-10-CM

## 2021-07-29 DIAGNOSIS — F200 Paranoid schizophrenia: Secondary | ICD-10-CM

## 2021-07-29 MED ORDER — OLANZAPINE 10 MG PO TABS: 10.0000 mg | ORAL_TABLET | Freq: Every day | ORAL | 0 refills | Status: DC

## 2021-07-29 MED ORDER — SERTRALINE HCL 50 MG PO TABS: 50.0000 mg | ORAL_TABLET | Freq: Every day | ORAL | 0 refills | Status: DC

## 2021-07-29 NOTE — Telephone Encounter (Signed)
Referral to R/O, R/I, Asperger's faxed to [facility]. Writer confirmed that practice is accepting referrals.

## 2021-07-29 NOTE — Progress Notes (Signed)
Virtual Visit via Video Note  I connected with Matthew Padilla on 07/29/21 at 11:00 AM EDT by a video enabled telemedicine application and verified that I am speaking with the correct person using two identifiers.  Location: Patient: Home Provider: Home Office   I discussed the limitations of evaluation and management by telemedicine and the availability of in person appointments. The patient expressed understanding and agreed to proceed.     [facility] Initial Assessment Note  Matthew Padilla [id number] 25 y.o.  07/29/2021 11:06 AM  Chief Complaint:  I need medications.  History of Present Illness:  Patient is a 25 year old Caucasian, unemployed single man who is referred from old [facility] for the management of his psychiatric illness.  Patient was admitted from October 5 to October 18 at old [facility] due to psychosis.  He was initially seen in the behavioral health emergency room because of bizarre behavior.  He was walking in the neighborhood and talking to himself.  He was not aggressive but at that time reported having hallucinations, self talking and self laughing.  He was not making eye contact and appeared confused.  He was started on Haldol which was continued at [facility] and discharged on 10 mg twice a day but started to have a lot of drooling, weight loss, poor appetite and [facility] where medicines were changed to olanzapine.  Currently he is taking olanzapine 5 mg twice a day and Zoloft 50 mg daily.  Most of the information was obtained from her mother who was also present in the session.  Patient has been doing better with the olanzapine and mother reported he is sleeping good and close to reality.  He denies any recent hallucination, confusion, paranoia or any suicidal thoughts.  He had lost more than 20 pounds in few weeks when he was on Haldol.  Patient has been overweight and last year he had  gained more than 50 pounds.  Patient denies any nightmares, flashback, panic attack, OCD symptoms, suicidal thoughts, mania.  He admitted history of impulsive eating and low testosterone.  He is seeing endocrinologist however have not seen in more than 6 months.  Patient feels olanzapine helping and he does not have any issues taking the medication.  He sleeps good.  He lives with his parents and he has no friends, social network.  He is hoping to start [facility] [employer] in January to start in a plane manufacturing program which he is interested.  Patient is single and currently not in any relationship.  He admitted smoking marijuana to calm himself but stopped since he discharged from the hospital.  He has no tremors, shakes or any EPS.    Past Psychiatric History: History of schizophrenia.  Admitted in 2019 at [facility] after having suicidal thoughts to crash his car but no intent.  He was discharged on REXULTI and Wellbutrin which he took for 4 weeks but stopped after not feeling any change.  History of inpatient in October 2022 at [facility] due to bizarre behavior and psychosis.  Given Haldol but stopped due to excessive drooling, weight loss.  Family History: Aunt and uncle has bipolar disorder.  No past medical history on file.   Traumatic brain injury: Denies any history of traumatic brain injury.  Work History; Patient worked in [employer] when he was living in [place] but currently unemployed.  Psychosocial History; Patient born and raised in [place].  He finished high school  but never went to college.  He admitted he has limited friends and social network in the school.  He recalled being isolated withdrawn and started to have depressive symptoms in the school.  He never had any relationship.  Currently he is living with his parents and siblings.  Family moved from [place] after father got the job in [place].  Family is living for more than a year in [place].  Legal History; Patient has outstanding speeding tickets in [place].  No history of arrest or jail time.  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  History of abuse; Denies any history of physical sexual or verbal abuse.  Neurologic: Headache: No Seizure: No Paresthesias: No   Outpatient Encounter Medications as of 07/29/2021  Medication Sig   OLANZapine (ZYPREXA) 5 MG tablet Take 1 tablet (5 mg total) by mouth 2 (two) times daily.   No facility-administered encounter medications on file as of 07/29/2021.    Recent Results (from the past 2160 hour(s))  Resp Panel by RT-PCR (Flu A&B, Covid) Nasopharyngeal Swab     Status: None   Collection Time: 07/08/21  3:15 PM   Specimen: Nasopharyngeal Swab; Nasopharyngeal(NP) swabs in vial transport medium  Result Value Ref Range   SARS Coronavirus 2 by RT PCR NEGATIVE NEGATIVE    Comment: (NOTE) SARS-CoV-2 target nucleic acids are NOT DETECTED.  The SARS-CoV-2 RNA is generally detectable in upper respiratory specimens during the acute phase of infection. The lowest concentration of SARS-CoV-2 viral copies this assay can detect is 138 copies/mL. A negative result does not preclude SARS-Cov-2 infection and should not be used as the sole basis for treatment or other patient management decisions. A negative result may occur with  improper specimen collection/handling, submission of specimen other than nasopharyngeal swab, presence of viral mutation(s) within the areas targeted by this assay, and inadequate number of viral copies(<138 copies/mL). A negative result must be combined with clinical observations, patient history, and epidemiological information. The expected result is Negative.  Fact Sheet for Patients:  BloggerCourse.com  Fact Sheet for Healthcare Providers:  SeriousBroker.it  This test is  no t yet approved or cleared by the [place] FDA and  has been authorized for detection and/or diagnosis of SARS-CoV-2 by FDA under an Emergency Use Authorization (EUA). This EUA will remain  in effect (meaning this test can be used) for the duration of the COVID-19 declaration under Section 564(b)(1) of the Act, 21 U.S.C.section 360bbb-3(b)(1), unless the authorization is terminated  or revoked sooner.       Influenza A by PCR NEGATIVE NEGATIVE   Influenza B by PCR NEGATIVE NEGATIVE    Comment: (NOTE) The Xpert Xpress SARS-CoV-2/FLU/RSV plus assay is intended as an aid in the diagnosis of influenza from Nasopharyngeal swab specimens and should not be used as a sole basis for treatment. Nasal washings and aspirates are unacceptable for Xpert Xpress SARS-CoV-2/FLU/RSV testing.  Fact Sheet for Patients: BloggerCourse.com  Fact Sheet for Healthcare Providers: SeriousBroker.it  This test is not yet approved or cleared by the [place] FDA and has been authorized for detection and/or diagnosis of SARS-CoV-2 by FDA under an Emergency Use Authorization (EUA). This EUA will remain in effect (meaning this test can be used) for the duration of the COVID-19 declaration under Section 564(b)(1) of the Act, 21 U.S.C. section 360bbb-3(b)(1), unless the authorization is terminated or revoked.  Performed at [facility], [address] [place], [place] [address]   Comprehensive metabolic panel     Status: Abnormal   Collection Time: 07/08/21  5:24 PM  Result Value Ref Range   Sodium 141 135 - 145 mmol/L   Potassium 3.6 3.5 - 5.1 mmol/L   Chloride 105 98 - 111 mmol/L   CO2 25 22 - 32 mmol/L   Glucose, Bld 80 70 - 99 mg/dL    Comment: Glucose reference range applies only to samples taken after fasting for at least 8 hours.   BUN 14 6 - 20 mg/dL   Creatinine, Ser 5.63 0.61 - 1.24 mg/dL   Calcium 9.5 8.9 - 87.5  mg/dL   Total Protein 7.7 6.5 - 8.1 g/dL   Albumin 4.8 3.5 - 5.0 g/dL   AST 50 (H) 15 - 41 U/L   ALT 54 (H) 0 - 44 U/L   Alkaline Phosphatase 53 38 - 126 U/L   Total Bilirubin 1.7 (H) 0.3 - 1.2 mg/dL   GFR, Estimated >64 >33 mL/min    Comment: (NOTE) Calculated using the CKD-EPI Creatinine Equation (2021)    Anion gap 11 5 - 15    Comment: Performed at [facility], [address], [place], [place] [address]  Ethanol     Status: None   Collection Time: 07/08/21  5:24 PM  Result Value Ref Range   Alcohol, Ethyl (B) <10 <10 mg/dL    Comment: (NOTE) Lowest detectable limit for serum alcohol is 10 mg/dL.  For medical purposes only. Performed at [facility], [address] [place], [place] [address]   CBC with Diff     Status: Abnormal   Collection Time: 07/08/21  5:24 PM  Result Value Ref Range   WBC 11.4 (H) 4.0 - 10.5 K/uL   RBC 5.75 4.22 - 5.81 MIL/uL   Hemoglobin 14.3 13.0 - 17.0 g/dL   HCT 06.3 01.6 - 01.0 %   MCV 78.1 (L) 80.0 - 100.0 fL   MCH 24.9 (L) 26.0 - 34.0 pg   MCHC 31.8 30.0 - 36.0 g/dL   RDW 93.2 35.5 - 73.2 %   Platelets 280 150 - 400 K/uL   nRBC 0.0 0.0 - 0.2 %   Neutrophils Relative % 76 %   Neutro Abs 8.6 (H) 1.7 - 7.7 K/uL   Lymphocytes Relative 18 %   Lymphs Abs 2.0 0.7 - 4.0 K/uL   Monocytes Relative 6 %   Monocytes Absolute 0.7 0.1 - 1.0 K/uL   Eosinophils Relative 0 %   Eosinophils Absolute 0.0 0.0 - 0.5 K/uL   Basophils Relative 0 %   Basophils Absolute 0.1 0.0 - 0.1 K/uL   Immature Granulocytes 0 %   Abs Immature Granulocytes 0.04 0.00 - 0.07 K/uL    Comment: Performed at [facility], [address] [place], [place] [address]  GC/Chlamydia probe amp ([place])not at [facility]     Status: None   Collection Time: 07/09/21 11:55 AM  Result Value Ref Range   Neisseria Gonorrhea Negative    Chlamydia Negative    Comment Normal Reference Ranger Chlamydia - Negative    Comment       Normal Reference Range Neisseria Gonorrhea - Negative  Urine rapid drug screen (hosp performed)     Status: Abnormal   Collection Time: 07/09/21 12:02 PM  Result Value Ref Range   Opiates NONE DETECTED NONE DETECTED   Cocaine NONE DETECTED NONE DETECTED   Benzodiazepines POSITIVE (A)  NONE DETECTED   Amphetamines NONE DETECTED NONE DETECTED   Tetrahydrocannabinol POSITIVE (A) NONE DETECTED   Barbiturates NONE DETECTED NONE DETECTED    Comment: (NOTE) DRUG SCREEN FOR MEDICAL PURPOSES ONLY.  IF CONFIRMATION IS NEEDED FOR ANY PURPOSE, NOTIFY LAB WITHIN 5 DAYS.  LOWEST DETECTABLE LIMITS FOR URINE DRUG SCREEN Drug Class                     Cutoff (ng/mL) Amphetamine and metabolites    1000 Barbiturate and metabolites    200 Benzodiazepine                 200 Tricyclics and metabolites     300 Opiates and metabolites        300 Cocaine and metabolites        300 THC                            50 Performed at [facility], [address], [place], [place] [address]   TSH     Status: None   Collection Time: 07/09/21 12:30 PM  Result Value Ref Range   TSH 2.016 0.350 - 4.500 uIU/mL    Comment: Performed by a 3rd Generation assay with a functional sensitivity of <=0.01 uIU/mL. Performed at [facility], [address] [place], [place] [address]   Vitamin B12     Status: None   Collection Time: 07/09/21 12:30 PM  Result Value Ref Range   Vitamin B-12 741 180 - 914 pg/mL    Comment: (NOTE) This assay is not validated for testing neonatal or myeloproliferative syndrome specimens for Vitamin B12 levels. Performed at [facility], [address], [place], [place] [address]   Folate, serum, performed at [facility] lab     Status: None   Collection Time: 07/09/21 12:30 PM  Result Value Ref Range   Folate 17.6 >5.9 ng/mL    Comment: Performed at [facility], [address] [place], [place] [address]  RPR      Status: None   Collection Time: 07/09/21 12:30 PM  Result Value Ref Range   RPR Ser Ql NON REACTIVE NON REACTIVE    Comment: Performed at [facility], [address] [place], [place] [address]  HIV Antibody (routine testing w rflx)     Status: None   Collection Time: 07/09/21 12:30 PM  Result Value Ref Range   HIV Screen 4th Generation wRfx Non Reactive Non Reactive    Comment: Performed at [facility], [address] [place], [place] [address]      Constitutional:  There were no vitals taken for this visit.   Musculoskeletal: Strength & Muscle Tone: within normal limits Gait & Station: normal Patient leans: N/A  Psychiatric Specialty Exam: Physical Exam  ROS  Weight (!) 333 lb (151 kg).There is no height or weight on file to calculate BMI.  General Appearance: Fairly Groomed  Eye Contact:  Fair  Speech:  Slow  Volume:  Decreased  Mood:  Anxious  Affect:  Flat  Thought Process:  Descriptions of Associations: Intact  Orientation:  Full (Time, Place, and Person)  Thought Content:  Rumination  Suicidal Thoughts:  No  Homicidal Thoughts:  No  Memory:  Immediate;   Fair Recent;   Fair Remote;   Fair  Judgement:  Intact  Insight:  Fair  Psychomotor Activity:  Decreased  Concentration:  Concentration: Fair and Attention Span: Fair  Recall:  [employer] of Knowledge:  Fair  Language:  Fair  Akathisia:  No  Handed:  Right  AIMS (if indicated):     Assets:  Communication Skills Desire for Improvement Housing Social Support  ADL's:  Intact  Cognition:  WNL  Sleep:   better     Assessment/Plan:  Patient is 25 year old with history of psychosis with recent inpatient treatment at [facility].  Initially discharged from Haldol but due to side effects switched to olanzapine.  Patient doing better on current dose of olanzapine 5 mg twice a day and Zoloft 50 mg daily.  I review history, current medication, blood work results.  Patient never had  psychological testing and there is a concern he may have underlying Asperger as patient do not have any social skills and network.  Currently he is not seeing any therapist.  We talked about switching olanzapine to a total dose of 10 mg at bedtime rather than 5 mg twice a day.  Continue Zoloft 50 mg daily.  We also talked about sending a referral for psychological testing and to see a therapist for coping skills.  He is overweight and also he had a history of low testosterone.  I encouraged to see endocrinologist follow-up.  We talk about medication side effects that olanzapine can cause metabolic syndrome and increased appetite and they need to watch carefully his weight.  Patient and his mother agreed.  Discussed safety concerns and anytime having active suicidal thoughts or homicidal thought and he need to call 911 or go to local emergency room.  Follow-up in 4 weeks.    Follow Up Instructions:    I discussed the assessment and treatment plan with the patient. The patient was provided an opportunity to ask questions and all were answered. The patient agreed with the plan and demonstrated an understanding of the instructions.  Cleotis Nipper, MD 07/29/2021

## 2021-08-04 ENCOUNTER — Telehealth (HOSPITAL_COMMUNITY): Payer: Self-pay | Admitting: *Deleted

## 2021-08-04 NOTE — Telephone Encounter (Signed)
Referral for psychological testing faxed to Dr.Steven Altabet @ [facility]

## 2021-08-20 ENCOUNTER — Encounter: Payer: Self-pay | Admitting: Psychology

## 2021-08-21 ENCOUNTER — Ambulatory Visit (HOSPITAL_COMMUNITY): Payer: 59 | Admitting: Psychiatry

## 2021-08-21 ENCOUNTER — Other Ambulatory Visit (HOSPITAL_COMMUNITY): Payer: Self-pay | Admitting: Psychiatry

## 2021-08-21 DIAGNOSIS — F200 Paranoid schizophrenia: Secondary | ICD-10-CM

## 2021-08-21 DIAGNOSIS — F419 Anxiety disorder, unspecified: Secondary | ICD-10-CM

## 2021-09-02 ENCOUNTER — Encounter (HOSPITAL_COMMUNITY): Payer: Self-pay | Admitting: Psychiatry

## 2021-09-02 ENCOUNTER — Other Ambulatory Visit: Payer: Self-pay

## 2021-09-02 ENCOUNTER — Telehealth (HOSPITAL_BASED_OUTPATIENT_CLINIC_OR_DEPARTMENT_OTHER): Payer: 59 | Admitting: Psychiatry

## 2021-09-02 DIAGNOSIS — F419 Anxiety disorder, unspecified: Secondary | ICD-10-CM

## 2021-09-02 DIAGNOSIS — F200 Paranoid schizophrenia: Secondary | ICD-10-CM

## 2021-09-02 MED ORDER — SERTRALINE HCL 50 MG PO TABS: ORAL_TABLET | ORAL | 1 refills | Status: DC

## 2021-09-02 MED ORDER — OLANZAPINE 7.5 MG PO TABS: ORAL_TABLET | ORAL | 1 refills | Status: DC

## 2021-09-02 NOTE — Progress Notes (Signed)
Virtual Visit via Video Note  I connected with Matthew Padilla on 09/02/21 at  2:20 PM EST by a video enabled telemedicine application and verified that I am speaking with the correct person using two identifiers.  Location: Patient: Home Provider: Home Office   I discussed the limitations of evaluation and management by telemedicine and the availability of in person appointments. The patient expressed understanding and agreed to proceed.  History of Present Illness: Patient is evaluated by video session.  His mother was also present.  Patient is a 25 year old Caucasian unemployed single man who was seen first time after he discharged from old [facility] month ago.  He is taking olanzapine 10 mg and Zoloft 50 mg daily.  Patient told he is sleeping too much and sometime he feels fatigued but denies any paranoia, hallucination or any suicidal thoughts.  His mother endorsed that his symptoms are stable and recently he had a visit to [place] to visit his father's side and that was a big accomplishment.  However he still stays to himself and withdrawn.  He requires a lot of encouragement to go outside.  His weight is unchanged from the past.  He has no tremor or shakes or any EPS.  He has not seen endocrinologist and he has appointment to see a psychologist on 21 December for psychological testing to rule out Asperger.  He is hoping to start [facility] in January as he is interested in Primary school teacher.  Patient has limited social network.  He is not smoking marijuana which he used to smoke in the past.  Past Psychiatric History: H/O schizophrenia.  Admitted in 2019 at [facility] after having suicidal thoughts to crash his car but no intent.  D/C on REXULTI and Wellbutrin but stopped after 4 weeks as no changed. H/O inpatient in October 2022 at [facility] for bizarre behavior and psychosis.  Given Haldol but stopped due to excessive drooling and sedation.   Psychiatric  Specialty Exam: Physical Exam  Review of Systems  Weight (!) 330 lb (149.7 kg).There is no height or weight on file to calculate BMI.  General Appearance: Fairly Groomed  Eye Contact:  Fair  Speech:  Slow  Volume:  Decreased  Mood:  Euthymic  Affect:  Congruent  Thought Process:  Goal Directed  Orientation:  Full (Time, Place, and Person)  Thought Content:  WDL  Suicidal Thoughts:  No  Homicidal Thoughts:  No  Memory:  Immediate;   Fair Recent;   Fair Remote;   Fair  Judgement:  Fair  Insight:  Present  Psychomotor Activity:  Decreased  Concentration:  Concentration: Fair and Attention Span: Fair  Recall:  [employer] of Knowledge:  Fair  Language:  Fair  Akathisia:  No  Handed:  Right  AIMS (if indicated):     Assets:  Communication Skills Desire for Improvement Housing Social Support  ADL's:  Intact  Cognition:  WNL  Sleep:   too much      Assessment and Plan: Schizophrenia chronic paranoid type.  Anxiety.  Rule out Asperger syndrome.  Discuss current medication.  Patient is still feels sedated with fatigue and lack of energy.  Recommended to cut down olanzapine from 10mg  to 7.5 mg to help his energy level.  However patient and his mother understand if symptoms started to come back and he will take 10 mg at bedtime.  Continue Zoloft 50 mg daily.  We talk about therapy to help his coping skills and  patient agree to give her a try.  He also like to keep his appointment for psychological testing to rule out Asperger syndrome.  Recommended to call us back if is any question or any concern.  Follow-up in 2 months.  Follow Up Instructions:    I discussed the assessment and treatment plan with the patient. The patient was provided an opportunity to ask questions and all were answered. The patient agreed with the plan and demonstrated an understanding of the instructions.   The patient was advised to call back or seek an in-person evaluation if the symptoms worsen or if the  condition fails to improve as anticipated.  I provided 23 minutes of non-face-to-face time during this encounter.   Kathlee Nations, MD

## 2021-09-08 ENCOUNTER — Ambulatory Visit (INDEPENDENT_AMBULATORY_CARE_PROVIDER_SITE_OTHER): Payer: 59 | Admitting: Clinical

## 2021-09-08 ENCOUNTER — Other Ambulatory Visit: Payer: Self-pay

## 2021-09-08 DIAGNOSIS — F200 Paranoid schizophrenia: Secondary | ICD-10-CM | POA: Diagnosis not present

## 2021-09-08 DIAGNOSIS — F419 Anxiety disorder, unspecified: Secondary | ICD-10-CM

## 2021-09-08 NOTE — Progress Notes (Signed)
Comprehensive Clinical Assessment (CCA) Note  09/08/2021 Matthew Padilla [id number] Virtual Visit via Video Note  I connected with Matthew Padilla on 09/08/21 at  1:00 PM EST by a video enabled telemedicine application and verified that I am speaking with the correct person using two identifiers.  Location: Patient: home Provider: office   I discussed the limitations of evaluation and management by telemedicine and the availability of in person appointments. The patient expressed understanding and agreed to proceed.   I discussed the assessment and treatment plan with the patient. The patient was provided an opportunity to ask questions and all were answered. The patient agreed with the plan and demonstrated an understanding of the instructions.   The patient was advised to call back or seek an in-person evaluation if the symptoms worsen or if the condition fails to improve as anticipated.  I provided 45 minutes of non-face-to-face time during this encounter.  Chief Complaint: Schizophrenia Visit Diagnosis: Schizophrenia, paranoid    Anxiety   CCA Screening, Triage and Referral (STR)  Patient Reported Information How did you hear about [place]?  Referral name: No data recorded Referral phone number: No data recorded  Whom do you see for routine medical problems? No data recorded Practice/Facility Name: No data recorded Practice/Facility Phone Number: No data recorded Name of Contact: No data recorded Contact Number: No data recorded Contact Fax Number: No data recorded Prescriber Name: No data recorded Prescriber Address (if known): No data recorded  What Is the Reason for Your Visit/Call Today? Per Chart Review-"Pt presented voluntarily and accompanied by his mother, Aydien Sudbeck, due to unwanted side effects of medications pt was prescribed at [facility] recently. Pt was at [place] from 10/4-10/18/22 having been discharged yesterday.Pt gave verbal permission for his mother to  participate in this session. Per mother, pt is not at his baseline as he is having disorganized thought and speech, slower speech and movement, excessive swaeting and drooling. Pt denied SI, HI, AVH, NSSH and any drug/alcohol use. Pt stated he does at times believe he is dicating some paranoia. Pt and mom are requesting a medication adjustment. Pt with mother's assistance are initiaing services to see providers at [facility]. Mother and pt stated that he has been taking his prescribed medications as they are prescribed."  How Long Has This Been Causing You Problems? 1 wk - 1 month  What Do You Feel Would Help You the Most Today? Individual therapy   Have You Recently Been in Any Inpatient Treatment (Hospital/Detox/Crisis Center/28-Day Program)? Yes, pt reports inpatient hospitalization in October for 2 weeks due to experiencing bizarre behavior. Pt states "I went into someone's home unannounced" Per pt report, he was also hospitalized in [place] in October 2019 due to depression and SI. Name/Location of Program/Hospital:No data recorded How Long Were You There? 2 weeks When Were You Discharged? No data recorded  Have You Ever Received Services From [facility] Before? No data recorded Who Do You See at [facility]? No data recorded  Have You Recently Had Any Thoughts About Hurting Yourself? No  Are You Planning to Commit Suicide/Harm Yourself At This time? No   Have you Recently Had Thoughts About [place]? No  Explanation: No data recorded  Have You Used Any Alcohol or Drugs in the Past 24 Hours? No  How Long Ago Did You Use Drugs or Alcohol? No data recorded What Did You Use and How Much? No data recorded  Do You Currently Have a Therapist/Psychiatrist? Yes, but  unable to recall name.  Name of Therapist/Psychiatrist: No data recorded  Have You Been Recently Discharged From Any Office Practice or Programs? No  Explanation of Discharge From Practice/Program: No  data recorded    CCA Screening Triage Referral Assessment Type of Contact:  Is this Initial or Reassessment?  Date Telepsych consult ordered in [facility]:   Time Telepsych consult ordered in [facility]:  No data recorded  Patient Reported Information Reviewed? No data recorded Patient Left Without Being Seen? No data recorded Reason for Not Completing Assessment: No data recorded  Collateral Involvement: No data recorded  Does Patient Have a Traer? No data recorded Name and Contact of Legal Guardian: No data recorded If Minor and Not Living with Parent(s), Who has Custody? No data recorded Is CPS involved or ever been involved? Never  Is APS involved or ever been involved? Never   Patient Determined To Be At Risk for Harm To Self or Others Based on Review of Patient Reported Information or Presenting Complaint? No  Method: No data recorded Availability of Means: No data recorded Intent: No data recorded Notification Required: No data recorded Additional Information for Danger to Others Potential: No data recorded Additional Comments for Danger to Others Potential: No data recorded Are There Guns or Other Weapons in Your Home? No, pt denies access Types of Guns/Weapons: No data recorded Are These Weapons Safely Secured?                            No data recorded Who Could Verify You Are Able To Have These Secured: No data recorded Do You Have any Outstanding Charges, Pending Court Dates, Parole/Probation? No data recorded Contacted To Inform of Risk of Harm To Self or Others: No data recorded  Location of Assessment:    Does Patient Present under Involuntary Commitment? No  IVC Papers Initial File Date: No data recorded  [place] of Residence: [place]   Patient Currently Receiving the Following Services: Medication management  Determination of Need: Routine (7 days)   Options For Referral: Outpatient Therapy; Medication Management   CCA  Biopsychosocial Intake/Chief Complaint:  No data recorded Current Symptoms/Problems: Pt reports "difficulty processing informaiton and communicating with others."   Patient Reported Schizophrenia/Schizoaffective Diagnosis in Past: Yes   Strengths: Pt unable to recall  Preferences: No data recorded Abilities: No data recorded  Type of Services Patient Feels are Needed: individual therapy  Initial Clinical Notes/Concerns: No data recorded  Mental Health Symptoms Depression:   Change in energy/activity; Difficulty Concentrating   Duration of Depressive symptoms:  Greater than two weeks   Mania:   None   Anxiety:    Difficulty concentrating; Worrying (Pt says he has concerns about his future)   Psychosis:    disorganized ; avolition  Duration of Psychotic symptoms:  Less than six months   Trauma:   N/A   Obsessions:   N/A   Compulsions:   N/A   Inattention:   N/A   Hyperactivity/Impulsivity:   N/A   Oppositional/Defiant Behaviors:   N/A   Emotional Irregularity:   N/A   Other Mood/Personality Symptoms:  No data recorded   Mental Status Exam Appearance and self-care  Stature:   Average   Weight:   Average weight   Clothing:   Neat/clean   Grooming:   Normal   Cosmetic use:   None   Posture/gait:   Normal   Motor activity:   Not Remarkable  Sensorium  Attention:   Normal   Concentration:   Normal   Orientation:   Time; Situation; Place; Person; Object   Recall/memory:   Normal   Affect and Mood  Affect:   normal   Mood:   Other (Comment) ("Tense but pretty good")   Relating  Eye contact:   None   Facial expression:   Responsive   Attitude toward examiner:   Cooperative   Thought and Language  Speech flow:  Clear and Coherent   Thought content:   Appropriate to Mood and Circumstances   Preoccupation:   None   Hallucinations:   None   Organization:  No data recorded  [employer] of  Knowledge:  No data recorded  Intelligence:   Average   Abstraction:   Normal   Judgement:   Normal   Reality Testing:   Adequate   Insight:   fair   Decision Making:   Vacilates   Social Functioning  Social Maturity:   Impulsive   Social Judgement:   isolates   Stress  Stressors:   Other (Comment) ("My future")   Coping Ability:   Overwhelmed; Exhausted   Skill Deficits:   Communication; Decision making; Activities of daily living   Supports:   Family     Religion: Religion/Spirituality Are You A Religious Person?: Yes What is Your Religious Affiliation?: Catholic  Leisure/Recreation: Leisure / Recreation Do You Have Hobbies?: No  Exercise/Diet: Exercise/Diet Do You Exercise?: Yes What Type of Exercise Do You Do?: Run/Walk How Many Times a Week Do You Exercise?: Daily Have You Gained or Lost A Significant Amount of Weight in the Past Six Months?: No (Pt reports fluctuating weight) Do You Follow a Special Diet?: Yes (unknown) Do You Have Any Trouble Sleeping?: No (unknown)   CCA Employment/Education Employment/Work Situation: Employment / Work Situation Employment Situation: Unemployed Has Patient ever Been in Passenger transport manager?: No  Education: Education Is Patient Currently Attending School?: No Last Grade Completed: 12 Did Teacher, adult education From [employer]?: Yes Did Physicist, medical?: Yes What Type of College Degree Do you Have?: Did not complete program, one semester at [employer]. Pt says he became "reclusive" and stopped taking care of ADL's Did You Have An Individualized Education Program (IIEP): No Did You Have Any Difficulty At School?: No Patient's Education Has Been Impacted by Current Illness: Yes How Does Current Illness Impact Education?: Pt says he has been accepted at [employer] but unsure if he will take classes   CCA Family/Childhood History Family and Relationship History: Family history Marital status: Single Does  patient have children?: No  Childhood History:  Childhood History By whom was/is the patient raised?: Both parents Additional childhood history information: Pt lives with parents. Moved to [place] from [place] in 2021. Description of patient's relationship with caregiver when they were a child: Pretty good Patient's description of current relationship with people who raised him/her: Pt reports relationship has improved Does patient have siblings?: Yes Number of Siblings: 2 Description of patient's current relationship with siblings: 2 sisters; Close relationship with older sister Did patient suffer any verbal/emotional/physical/sexual abuse as a child?: No Did patient suffer from severe childhood neglect?: No Has patient ever been sexually abused/assaulted/raped as an adolescent or adult?: No Witnessed domestic violence?: No Has patient been affected by domestic violence as an adult?: No  Child/Adolescent Assessment:     CCA Substance Use Alcohol/Drug Use: Alcohol / Drug Use Pain Medications: SEE MAR Prescriptions: SEE MAR Over the Counter:  SEE MAR History of alcohol / drug use?: No history of alcohol / drug abuse                         ASAM's:  Six Dimensions of Multidimensional Assessment  Dimension 1:  Acute Intoxication and/or Withdrawal Potential:      Dimension 2:  Biomedical Conditions and Complications:      Dimension 3:  Emotional, Behavioral, or Cognitive Conditions and Complications:     Dimension 4:  Readiness to Change:     Dimension 5:  Relapse, Continued use, or Continued Problem Potential:     Dimension 6:  Recovery/Living Environment:     ASAM Severity Score:    ASAM Recommended Level of Treatment:     Substance use Disorder (SUD) Substance Use Disorder (SUD)  Checklist Symptoms of Substance Use:  (none reported)  Recommendations for Services/Supports/Treatments: Recommendations for Services/Supports/Treatments Recommendations For  Services/Supports/Treatments: Medication Management, Individual Therapy  DSM5 Diagnoses: Patient Active Problem List   Diagnosis Date Noted   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

## 2021-09-08 NOTE — Plan of Care (Signed)
Pt will work toward improving ADL's as evidenced by making his bed and showering 5 out of 7 days per week. Pt participated in completion of treatment plan.

## 2021-09-19 ENCOUNTER — Other Ambulatory Visit (HOSPITAL_COMMUNITY): Payer: Self-pay | Admitting: Psychiatry

## 2021-09-19 DIAGNOSIS — F200 Paranoid schizophrenia: Secondary | ICD-10-CM

## 2021-09-22 ENCOUNTER — Ambulatory Visit (INDEPENDENT_AMBULATORY_CARE_PROVIDER_SITE_OTHER): Payer: 59 | Admitting: Clinical

## 2021-09-22 DIAGNOSIS — F419 Anxiety disorder, unspecified: Secondary | ICD-10-CM

## 2021-09-22 DIAGNOSIS — F200 Paranoid schizophrenia: Secondary | ICD-10-CM

## 2021-09-22 NOTE — Progress Notes (Signed)
° °  THERAPIST PROGRESS NOTE  Session Time: 2pm  Participation Level: Active  Behavioral Response: CasualAlertEuthymic  Type of Therapy: Individual Therapy  Treatment Goals addressed: Coping  Interventions: Supportive Virtual Visit via Video Note  I connected with Maryjane Hurter on 09/22/21 at  2:00 PM EST by a video enabled telemedicine application and verified that I am speaking with the correct person using two identifiers.  Location: Patient: home Provider: office   I discussed the limitations of evaluation and management by telemedicine and the availability of in person appointments. The patient expressed understanding and agreed to proceed.   I discussed the assessment and treatment plan with the patient. The patient was provided an opportunity to ask questions and all were answered. The patient agreed with the plan and demonstrated an understanding of the instructions.   The patient was advised to call back or seek an in-person evaluation if the symptoms worsen or if the condition fails to improve as anticipated.  I provided 40 minutes of non-face-to-face time during this encounter.  Summary: Pt presents in a euthymic manner. Pt describes his mood as "pretty good" Pt reports he  showered 5/7 days each week since last session. Pt also states he was able to make his bed 3-4 days since last session. In the past, pt states he would take showers sporadically. Per pt report, he has found it helpful to set daily reminders and alarms on his phone each to prompt him to complete task. According to pt, showering each night at 9pm is more convenient for him. Pt says he would like to work on completing his laundry more routinely and has designated Monday and Thursday as laundry days.  Suicidal/Homicidal: Pt denies SI/HI, no reports of AH/VH.  Therapist Response: Todays session consisted of assisting pt with creating a daily/weekly schedule of ADL's that he would like to complete on a  consistent basis. Processed with pt ways in which to avoid distractions that may deter him from achieving his goals.  Plan: Return again in 2 weeks.  Diagnosis: Axis I: Schizophrenia, paranoid    Anxiety    Axis II: No diagnosis    Suzan Slick, LCSW 09/22/2021

## 2021-09-24 ENCOUNTER — Encounter: Payer: 59 | Attending: Psychology | Admitting: Psychology

## 2021-10-07 ENCOUNTER — Ambulatory Visit (INDEPENDENT_AMBULATORY_CARE_PROVIDER_SITE_OTHER): Payer: 59 | Admitting: Clinical

## 2021-10-07 ENCOUNTER — Other Ambulatory Visit: Payer: Self-pay

## 2021-10-07 DIAGNOSIS — F200 Paranoid schizophrenia: Secondary | ICD-10-CM

## 2021-10-07 NOTE — Progress Notes (Signed)
° °  THERAPIST PROGRESS NOTE  Session Time: 3pm  Participation Level: Active  Behavioral Response: CasualAlert"neutral"  Type of Therapy: Individual Therapy  Treatment Goals addressed: Coping  Interventions: Supportive Virtual Visit via Video Note  I connected with Matthew Padilla on 10/07/21 at  3:00 PM EST by a video enabled telemedicine application and verified that I am speaking with the correct person using two identifiers.  Location: Patient: home Provider: office   I discussed the limitations of evaluation and management by telemedicine and the availability of in person appointments. The patient expressed understanding and agreed to proceed.   I discussed the assessment and treatment plan with the patient. The patient was provided an opportunity to ask questions and all were answered. The patient agreed with the plan and demonstrated an understanding of the instructions.   The patient was advised to call back or seek an in-person evaluation if the symptoms worsen or if the condition fails to improve as anticipated.  I provided 35 minutes of non-face-to-face time during this encounter.  Summary: Pt describes his mood as "neutral" but says he has been experiencing some anxiety. Per pt report he is doing well with completing daily hygiene and is making his bed 3/5 days. Pt states he has been unmotivated to do his laundry but says he plans to do so this week. Pt reports his parents would like for him to get active and states he is walking 3x per week. According to pt setting reminders and alarms in his phone has been helpful with creating a schedule. Pt reports decrease in agitation.  Suicidal/Homicidal: Pt denies SI/HI, no reports of AH/VH.  Therapist Response: CSW assessed for changes in mood, behavior and daily functioning. CSW probed for feedback on pt daily routine(progress and improvement) CSW discussed with pt ways to create healthy habits and assisted him in identifying  things he can incorporate into creating self care routine.  Plan: Return again in 2 weeks.  Diagnosis: Axis I: Schizophrenia, paranoid    Axis II: No diagnosis    Suzan Slick, LCSW 10/07/2021

## 2021-10-20 ENCOUNTER — Other Ambulatory Visit: Payer: Self-pay

## 2021-10-20 ENCOUNTER — Ambulatory Visit (INDEPENDENT_AMBULATORY_CARE_PROVIDER_SITE_OTHER): Payer: 59 | Admitting: Clinical

## 2021-10-20 DIAGNOSIS — F200 Paranoid schizophrenia: Secondary | ICD-10-CM | POA: Diagnosis not present

## 2021-10-20 NOTE — Progress Notes (Signed)
° °  THERAPIST PROGRESS NOTE  Session Time: 2pm  Participation Level: Active  Behavioral Response: CasualAlertpleasant  Type of Therapy: Individual Therapy  Treatment Goals addressed: Coping  Interventions: CBT  Virtual Visit via Video Note  I connected with Matthew Padilla on 10/20/21 at  2:00 PM EST by a video enabled telemedicine application and verified that I am speaking with the correct person using two identifiers.  Location: Patient: home Provider: office   I discussed the limitations of evaluation and management by telemedicine and the availability of in person appointments. The patient expressed understanding and agreed to proceed.   I discussed the assessment and treatment plan with the patient. The patient was provided an opportunity to ask questions and all were answered. The patient agreed with the plan and demonstrated an understanding of the instructions.   The patient was advised to call back or seek an in-person evaluation if the symptoms worsen or if the condition fails to improve as anticipated.  I provided 38 minutes of non-face-to-face time during this encounter.  Summary: Pt presents in pleasant mood. Pt reports he is walking 1.5 mi per day and is finding it helpful to have his father as accountability partner. Pt reports improvement with ADL's and says having a scheduled helps him stay on track. Pt discussed how stress causes increase in paranoia and irritability. Pt identifies paranoia when in public settings as his source of stress. Pt says he feels more comfortable when his father is present. According to pt, before he reacts he waits at least 30 seconds to see if the thought passes and to challenge irrational thought.  Suicidal/Homicidal: Pt denies SI/HI, no reports of AH/VH.  Therapist Response: Reviewed and provided pt with information on stress management techniques. Assisted pt in identifying examples of facts vs feelings. Prompted pt to identify  responses to stress(restlessness, increased heart rate, sweating, etc). Processed using social support, emotional management and life balance to deal with emotions in healthy way.  Plan: Return again in 2 weeks.  Diagnosis: Axis I: Schizophrenia, paranoid type    Axis II: No diagnosis    Suzan Slick, LCSW 10/20/2021

## 2021-11-03 ENCOUNTER — Encounter (HOSPITAL_COMMUNITY): Payer: 59 | Admitting: Psychiatry

## 2021-11-03 ENCOUNTER — Other Ambulatory Visit: Payer: Self-pay

## 2021-11-03 ENCOUNTER — Encounter (HOSPITAL_COMMUNITY): Payer: Self-pay

## 2021-11-04 ENCOUNTER — Telehealth (HOSPITAL_COMMUNITY): Payer: 59 | Admitting: Psychiatry

## 2021-11-04 ENCOUNTER — Telehealth (HOSPITAL_COMMUNITY): Payer: Self-pay | Admitting: Clinical

## 2021-11-04 ENCOUNTER — Ambulatory Visit (HOSPITAL_COMMUNITY): Payer: 59 | Admitting: Clinical

## 2021-11-04 NOTE — Progress Notes (Signed)
No show

## 2021-11-06 ENCOUNTER — Other Ambulatory Visit (HOSPITAL_COMMUNITY): Payer: Self-pay | Admitting: Psychiatry

## 2021-11-06 DIAGNOSIS — F200 Paranoid schizophrenia: Secondary | ICD-10-CM

## 2021-11-28 ENCOUNTER — Other Ambulatory Visit (HOSPITAL_COMMUNITY): Payer: Self-pay | Admitting: *Deleted

## 2021-11-28 ENCOUNTER — Telehealth (HOSPITAL_COMMUNITY): Payer: Self-pay | Admitting: *Deleted

## 2021-11-28 DIAGNOSIS — F200 Paranoid schizophrenia: Secondary | ICD-10-CM

## 2021-11-28 MED ORDER — OLANZAPINE 7.5 MG PO TABS: ORAL_TABLET | ORAL | 0 refills | Status: DC

## 2021-11-28 NOTE — Telephone Encounter (Signed)
Pt's mother, [name], called requesting refill of the Zyprexa 7.5 mg. Pt only completed appointment with you was on 09/02/21. Rx sent at that time #30 with one fill. Pt no showed 11/03/21 and then had appointment with Dr. Donell Beers on 11/04/21 which was cancelled. Pt also has multiple no shows and cancellations with [facility]. Also pt was referred to Dr. Kieth Brightly and was no show. FYI. Do you want to make appointment, Or bridge and letter? Please review and advise. Thanks.

## 2021-11-28 NOTE — Telephone Encounter (Signed)
Please give him 30 days supply with recommendation to see provider. If another no show or cancellation than will send letter to close him out.

## 2021-12-04 ENCOUNTER — Encounter (HOSPITAL_COMMUNITY): Payer: Self-pay | Admitting: Psychiatry

## 2021-12-04 ENCOUNTER — Other Ambulatory Visit: Payer: Self-pay

## 2021-12-04 ENCOUNTER — Other Ambulatory Visit (HOSPITAL_COMMUNITY): Payer: Self-pay | Admitting: *Deleted

## 2021-12-04 ENCOUNTER — Telehealth (HOSPITAL_BASED_OUTPATIENT_CLINIC_OR_DEPARTMENT_OTHER): Payer: 59 | Admitting: Psychiatry

## 2021-12-04 DIAGNOSIS — F419 Anxiety disorder, unspecified: Secondary | ICD-10-CM

## 2021-12-04 DIAGNOSIS — F200 Paranoid schizophrenia: Secondary | ICD-10-CM

## 2021-12-04 DIAGNOSIS — Z79899 Other long term (current) drug therapy: Secondary | ICD-10-CM

## 2021-12-04 MED ORDER — SERTRALINE HCL 50 MG PO TABS: ORAL_TABLET | ORAL | 1 refills | Status: DC

## 2021-12-04 MED ORDER — OLANZAPINE 7.5 MG PO TABS: ORAL_TABLET | ORAL | 1 refills | Status: DC

## 2021-12-04 NOTE — Progress Notes (Signed)
Virtual Visit via Video Note ? ?I connected with Matthew Padilla on 12/04/21 at 11:20 AM EST by a video enabled telemedicine application and verified that I am speaking with the correct person using two identifiers. ? ?Location: ?Patient: Home ?Provider: Home Office ?  ?I discussed the limitations of evaluation and management by telemedicine and the availability of in person appointments. The patient expressed understanding and agreed to proceed. ? ?History of Present Illness: ?Patient is evaluated by video session.  He had missed last appointment but able to get the refills.  We have cut down his olanzapine and now he is taking 7.5 mg.  He admitted not as sedated and able to get sleep 8 to 9 hours.  He had a visit with Lowella Dandy but he need to reschedule more visits and he has not done yet.  Patient also reschedule his appointment with the psychologist and appointment is in June.  He is supposed to start [employer] in January but he admitted he had a cold feet and did not enroll but now he has plan to enroll and summer.  He reported his symptoms are stable and denies any recent paranoia, hallucination, anger, irritability.  He does go outside for a walk and sometimes with his mother.  He admitted few pounds weight gain but otherwise no major concern.  He is supposed to see PCP but he has not made an appointment.  He is not smoking marijuana.  He denies any tremors, shakes or any EPS.  Denies any panic attack.  He like to have a therapist appointment to be scheduled.   ? ?Past Psychiatric History: ?H/O schizophrenia.  Admitted in 2019 at [facility] after having suicidal thoughts to crash his car but no intent.  D/C on REXULTI and Wellbutrin but stopped after 4 weeks as no changed. H/O inpatient in October 2022 at [facility] for bizarre behavior and psychosis.  Given Haldol but stopped due to excessive drooling and sedation.  ? ?Psychiatric Specialty Exam: ?Physical  Exam  ?Review of Systems  ?Weight (!) 335 lb (152 kg).There is no height or weight on file to calculate BMI.  ?General Appearance: Fairly Groomed  ?Eye Contact:  Fair  ?Speech:  Slow  ?Volume:  Decreased  ?Mood:  Euthymic  ?Affect:  Congruent  ?Thought Process:  Descriptions of Associations: Intact  ?Orientation:  Full (Time, Place, and Person)  ?Thought Content:  WDL  ?Suicidal Thoughts:  No  ?Homicidal Thoughts:  No  ?Memory:  Immediate;   Fair ?Recent;   Fair ?Remote;   Fair  ?Judgement:  Fair  ?Insight:  Shallow  ?Psychomotor Activity:  Decreased  ?Concentration:  Concentration: Fair and Attention Span: Fair  ?Recall:  Fair  ?Fund of Knowledge:  Fair  ?Language:  Fair  ?Akathisia:  No  ?Handed:  Right  ?AIMS (if indicated):     ?Assets:  Communication Skills ?Housing ?Social Support  ?ADL's:  Intact  ?Cognition:  WNL  ?Sleep:   8-9 hrs  ? ? ? ? ?Assessment and Plan: ?Schizophrenia chronic paranoid type.  Anxiety.  Rule out Asperger syndrome. ? ?Patient requires a lot of encouragement to keep appointments.  He gets very anxious.  He did not started [employer], he did not keep the appointment with psychologist and PCP.  However he promised he is going to do it all these things very soon.  He feels better with decreased olanzapine dose.  We discussed to have a blood work specially hemoglobin  A1c since olanzapine can cause metabolic syndrome.  We will do hemoglobin A1c level.  Encourage regular walking exercise and watch his calorie intake.  Patient like to schedule follow-up appointment with his therapist Lowella Dandy.  I also reminded the keep the appointment with Dr. Kieth Brightly for psychological testing to rule out Asperger syndrome.  Patient has appointment in June.  Recommended to call us back with any question or any concern.  Follow-up in 2 months. ? ?Follow Up Instructions: ? ?  ?I discussed the assessment and treatment plan with the patient. The patient was provided an  opportunity to ask questions and all were answered. The patient agreed with the plan and demonstrated an understanding of the instructions. ?  ?The patient was advised to call back or seek an in-person evaluation if the symptoms worsen or if the condition fails to improve as anticipated. ? ?I provided 22 minutes of non-face-to-face time during this encounter. ? ? ?Cleotis Nipper, MD  ?

## 2021-12-25 ENCOUNTER — Other Ambulatory Visit (HOSPITAL_COMMUNITY): Payer: Self-pay | Admitting: Psychiatry

## 2021-12-25 DIAGNOSIS — F200 Paranoid schizophrenia: Secondary | ICD-10-CM

## 2021-12-30 ENCOUNTER — Ambulatory Visit (INDEPENDENT_AMBULATORY_CARE_PROVIDER_SITE_OTHER): Payer: 59 | Admitting: Clinical

## 2021-12-30 ENCOUNTER — Other Ambulatory Visit: Payer: Self-pay

## 2021-12-30 DIAGNOSIS — F200 Paranoid schizophrenia: Secondary | ICD-10-CM | POA: Diagnosis not present

## 2021-12-30 NOTE — Progress Notes (Signed)
? ?  THERAPIST PROGRESS NOTE ? ?Session Time: 1pm ? ?Participation Level: Active ? ?Behavioral Response: AlertEuthymic ? ?Type of Therapy: Individual Therapy ? ?ProgressTowards Goals: Progressing Pt says him and his family walk 2 miles per day and is making his bed daily and doing laundry on assigned days. ? ?Interventions: Supportive ?Virtual Visit via Telephone Note ? ?I connected with Rhea Bleacher on 12/30/21 at  1:00 PM EDT by telephone and verified that I am speaking with the correct person using two identifiers. ? ?Location: ?Patient: home ?Provider: office ?  ?I discussed the limitations, risks, security and privacy concerns of performing an evaluation and management service by telephone and the availability of in person appointments. I also discussed with the patient that there may be a patient responsible charge related to this service. The patient expressed understanding and agreed to proceed. ?  ?I discussed the assessment and treatment plan with the patient. The patient was provided an opportunity to ask questions and all were answered. The patient agreed with the plan and demonstrated an understanding of the instructions. ?  ?The patient was advised to call back or seek an in-person evaluation if the symptoms worsen or if the condition fails to improve as anticipated. ? ?I provided 35 minutes of non-face-to-face time during this encounter.  ?Summary: Writer informed pt last day of employment will be 4/21. Pt says he will let writer know during follow up session if he would like to be referred to a new provider. Pt presents in euthymic manner. Pt had connection issue so phone session completed. Pt says he is looking forward to attending his cousins wedding in [place] in May. Pt says he will also be attending a baseball game and voice concern. Pt says being around a large group of people is concerning for him. CSW explained grounding techniques and provided him with examples to assist him with staying  present in the moment. Discussed the counting and categories methods to divert his mind away from uncomfortable thoughts and feelings. ?Suicidal/Homicidal: Pt denies SI/HI no plan intent or attempt to harm self or others reported. No psychotic sxs reported. ? ?Plan: Return again in 2 weeks. ? ?Diagnosis: Schizophrenia, paranoid ? ?Collaboration of Care: Other none requested ? ?Patient/Guardian was advised Release of Information must be obtained prior to any record release in order to collaborate their care with an outside provider. Patient/Guardian was advised if they have not already done so to contact the registration department to sign all necessary forms in order for [place] to release information regarding their care.  ? ?Consent: Patient/Guardian gives verbal consent for treatment and assignment of benefits for services provided during this visit. Patient/Guardian expressed understanding and agreed to proceed.  ? ?Maxum Cassarino Lynelle Smoke, LCSW ?12/30/2021 ? ?

## 2022-01-22 ENCOUNTER — Telehealth (HOSPITAL_COMMUNITY): Payer: Self-pay | Admitting: Clinical

## 2022-01-22 ENCOUNTER — Ambulatory Visit (HOSPITAL_COMMUNITY): Payer: 59 | Admitting: Clinical

## 2022-02-04 ENCOUNTER — Encounter (HOSPITAL_COMMUNITY): Payer: Self-pay | Admitting: Psychiatry

## 2022-02-04 ENCOUNTER — Telehealth (HOSPITAL_BASED_OUTPATIENT_CLINIC_OR_DEPARTMENT_OTHER): Payer: 59 | Admitting: Psychiatry

## 2022-02-04 DIAGNOSIS — F200 Paranoid schizophrenia: Secondary | ICD-10-CM | POA: Diagnosis not present

## 2022-02-04 DIAGNOSIS — F419 Anxiety disorder, unspecified: Secondary | ICD-10-CM | POA: Diagnosis not present

## 2022-02-04 MED ORDER — OLANZAPINE 7.5 MG PO TABS: ORAL_TABLET | ORAL | 1 refills | Status: DC

## 2022-02-04 MED ORDER — SERTRALINE HCL 50 MG PO TABS: ORAL_TABLET | ORAL | 1 refills | Status: DC

## 2022-02-04 NOTE — Progress Notes (Signed)
Virtual Visit via Video Note ? ?I connected with Matthew Padilla on 02/04/22 at 10:40 AM EDT by a video enabled telemedicine application and verified that I am speaking with the correct person using two identifiers. ? ?Location: ?Patient: Home ?Provider: Home Office ?  ?I discussed the limitations of evaluation and management by telemedicine and the availability of in person appointments. The patient expressed understanding and agreed to proceed. ? ?History of Present Illness: ?Patient is evaluated by video session.  He is taking olanzapine and Zoloft.  He was seeing Darren however since she left he has not scheduled appointment with a new provider.  He also has not done blood work which was recommended on the last visit.  He is taking olanzapine and I emphasized the need to do hemoglobin A1c.  He is hoping to start somewhat classes at [employer].  He is not sure about his major but like to take general classes.  He started walking every day around the neighborhood.  He is also had a plan to attend block party coming up this weekend.  He feels his anxiety is not as bad and he denies any recent paranoia, agitation, hallucination, voices.  He sleeps good.  Despite walking every day he does not see his weight has changed.  He denies any tremor or shakes or any EPS.  He lives with his mother.  Patient does not drive so he is dependent on his mother for a doctor's appointment and blood work.  He has not established care with his PCP.   ? ? ?Past Psychiatric History: ?H/O schizophrenia.  Admitted in 2019 at [facility] after having suicidal thoughts to crash his car but no intent.  D/C on REXULTI and Wellbutrin but stopped after 4 weeks as no changed. H/O inpatient in October 2022 at [facility] for bizarre behavior and psychosis.  Given Haldol but stopped due to excessive drooling and sedation.  ? ?Psychiatric Specialty Exam: ?Physical Exam  ?Review of Systems  ?Weight (!) 340 lb  (154.2 kg).There is no height or weight on file to calculate BMI.  ?General Appearance: Fairly Groomed  ?Eye Contact:  Fair  ?Speech:  Slow  ?Volume:  Decreased  ?Mood:  Euthymic  ?Affect:  Constricted  ?Thought Process:  Goal Directed  ?Orientation:  Full (Time, Place, and Person)  ?Thought Content:  Rumination  ?Suicidal Thoughts:  No  ?Homicidal Thoughts:  No  ?Memory:  Immediate;   Fair ?Recent;   Fair ?Remote;   Fair  ?Judgement:  Fair  ?Insight:  Shallow  ?Psychomotor Activity:  Decreased  ?Concentration:  Concentration: Fair and Attention Span: Fair  ?Recall:  Fair  ?Fund of Knowledge:  Fair  ?Language:  Fair  ?Akathisia:  No  ?Handed:  Right  ?AIMS (if indicated):     ?Assets:  Communication Skills ?Desire for Improvement ?Housing ?Social Support  ?ADL's:  Intact  ?Cognition:  WNL  ?Sleep:   ok  ? ? ? ? ?Assessment and Plan: ?Schizophrenia chronic paranoid type.  Anxiety.  Rule out Asperger syndrome. ? ?Patient is stable on his current medication but need a lot of reminders for his blood work and to establish care with his PCP.  Reemphasize for blood work which was recommended hemoglobin A1c as patient taking olanzapine that cause metabolic syndrome.  I also emphasized the need to call therapist to schedule appointment.  Discussed exercise, watching his calorie intake.  Encouraged to establish care with a PCP.  Patient has hypogonadism.  Recommended  to call us back if there is any question or any concern.  He has appointment on June 19 with Dr. Kieth Brightly to rule out Asperger syndrome.  I encourage to keep that appointment.  Follow-up in 3 months. ? ? ? ?Follow Up Instructions: ? ?  ?I discussed the assessment and treatment plan with the patient. The patient was provided an opportunity to ask questions and all were answered. The patient agreed with the plan and demonstrated an understanding of the instructions. ?  ?The patient was advised to call back or seek an in-person evaluation if the symptoms worsen or  if the condition fails to improve as anticipated. ? ?Collaboration of Care: Other provider involved in patient's care AEB notes are available in epic to review. ? ?Patient/Guardian was advised Release of Information must be obtained prior to any record release in order to collaborate their care with an outside provider. Patient/Guardian was advised if they have not already done so to contact the registration department to sign all necessary forms in order for [place] to release information regarding their care.  ? ?Consent: Patient/Guardian gives verbal consent for treatment and assignment of benefits for services provided during this visit. Patient/Guardian expressed understanding and agreed to proceed.   ? ?I provided 21.  Minutes of non-face-to-face time during this encounter. ? ? ?Cleotis Nipper, MD  ?

## 2022-03-23 ENCOUNTER — Encounter: Payer: 59 | Attending: Psychology | Admitting: Psychology

## 2022-03-23 DIAGNOSIS — F200 Paranoid schizophrenia: Secondary | ICD-10-CM | POA: Diagnosis present

## 2022-03-23 DIAGNOSIS — F411 Generalized anxiety disorder: Secondary | ICD-10-CM | POA: Diagnosis present

## 2022-03-23 NOTE — Progress Notes (Signed)
Neuropsychological Consultation   Patient:   Matthew Padilla   DOB:   11-30-1995  MR Number:  [id number]  Location:  [facility] FOR PAIN AND [facility] MEDICINE [facility] PHYSICAL MEDICINE AND REHABILITATION [address] [place], [address] [id number] [facility] [place] [address] Dept: [phone]           Date of Service:   03/23/2022  Start Time:   3 PM End Time:   5 PM  Today's visit was an in person visit that was conducted in my outpatient clinic office.  The patient myself were present for this interview.  1 hour and 15 minutes was spent in formal face-to-face clinical interview and the other 45 minutes was spent with records review, report writing and setting up testing protocols.  Provider/Observer:  Arley Phenix, Psy.D.       Clinical Neuropsychologist       Billing Code/Service: [phone]  Chief Complaint:    Matthew Padilla is a 26 year old male referred for psychological/neuropsychological evaluation to facilitate with differential diagnosis.  Considerations with differential diagnosis include paranoid schizophrenia and anxiety versus Asperger's/mild autistic spectrum disorder.  He was referred by his treating psychiatrist Kathryne Sharper, MD.  The patient has also been working with a therapist with behavioral health as well.  The patient does have a history of previous diagnosis of schizophrenia.  The patient was admitted for inpatient hospitalization in 2019 in [place] after developing suicidal thoughts related to crashing his car.  There was never any attempt to harm self.  Patient had a second inpatient hospitalization in October 2022 here in [place] at [facility] because of development of bizarre behavior and psychosis.  Patient was initially put on Haldol but stopped due to excessive drooling and sedation.  Current medications include Zyprexa and Zoloft and the patient reports that he is having a good response to this medication strategy and feels like he is  making progress.  The patient is planning to return back to college soon and is hoping to maintain some stability.  Reason for Service:  Matthew Padilla is a 26 year old male referred for psychological/neuropsychological evaluation to facilitate with differential diagnosis.  Considerations with differential diagnosis include paranoid schizophrenia and anxiety versus Asperger's/mild autistic spectrum disorder.  He was referred by his treating psychiatrist Kathryne Sharper, MD.  The patient has also been working with a therapist with behavioral health as well.  The patient does have a history of previous diagnosis of schizophrenia.  The patient was admitted for inpatient hospitalization in 2019 in [place] after developing suicidal thoughts related to crashing his car.  There was never any attempt to harm self.  Patient had a second inpatient hospitalization in October 2022 here in [place] at [facility] because of development of bizarre behavior and psychosis.  Patient was initially put on Haldol but stopped due to excessive drooling and sedation.  Current medications include Zyprexa and Zoloft and the patient reports that he is having a good response to this medication strategy and feels like he is making progress.  The patient is planning to return back to college soon and is hoping to maintain some stability.  The patient provides some detail around the development of his difficulties.  The patient reports that they really began to manifest in ways that caused significant impact on his life around the age of [age].  The patient had completed high school and started classes at the [employer] [place].  He is also participating in marching band.  While the patient struggled  with getting all of his class work done in high school did not make a particularly good grades he did make a 30 on the ACT is clearly a very bright and intelligent young man.  The patient continued to struggle with getting his work done.   In October 1 year he really started noticing changes and it became increasingly hard to navigate his [place] experience.  The patient reports that he was in the dorms and he did not get along very well with his roommate and they regularly clash.  He reports that he could not get any piece.  He left school halfway through the semester and did not pass any of his classes.  He was not allowed to return to school but was given some options for how to get his return.  He and his parents decided not to pursue his return to the [place] contact he at that time.  After leaving school he has worked several jobs since then.  He worked at a [employer] for a while and then [employer].  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  The patient reports that he did much better there due to not having to interact with many people.  The patient's family recently moved to [facility] this past September.  The patient is not gotten a job but is working on returning to school.  The patient reports that with medication changes that he is doing much better.  The patient reports that when he has had his difficulties that he had significant difficulty keeping his thoughts organized and clear.  He would misinterpret things and become very focused on certain aspects of situations and had difficulty with reality checking.  The patient would make connections and feels certain things were very important that others would not do is important.  He reports that he would get in an increasingly stressed state and have negative feelings.  The patient reports that as far as he knows there is no family history of schizophrenia or autism but has been told that his great uncle was diagnosed with schizophrenia.  The patient reports that he is sleeping well now and is doing better.  He has also recently started  becoming more physically active.  Behavioral Observation: Matthew Padilla  presents as a 26 y.o.-year-old Left handed Caucasian Male who appeared his stated age. his dress was Appropriate and he was Well Groomed and his manners were Appropriate to the situation.  his participation was indicative of Appropriate behaviors.  There were not physical disabilities noted.  he displayed an appropriate level of cooperation and motivation.     Interactions:    Active Appropriate  Attention:   within normal limits and attention span and concentration were age appropriate  Memory:   within normal limits; recent and remote memory intact  Visuo-spatial:  not examined  Speech (Volume):  normal  Speech:   normal; normal  Thought Process:  Coherent and Relevant  Though Content:  WNL; not suicidal and not homicidal  Orientation:   person, place, time/date, and situation  Judgment:   Good  Planning:   Fair  Affect:    Anxious  Mood:    Anxious  Insight:   Good  Intelligence:   high  Marital Status/Living: The patient was born in [place] along with 2 siblings.  He  is the middle child.  Developmental milestones were achieved at the appropriate time.  The patient continues to live with his parents and has lived with them his entire time except for a brief period of time where he lived in on campus housing at the [employer].  Current Employment: The patient is not currently working.  Past Employment:  The patient is worked various jobs since Designer, jewellery.  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  With at least the pizza delivery job he had difficulty being on time for work.  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  He had his best job performance that this last job.  However, that ended when he and his family moved to [place].  Substance Use:  No concerns of substance abuse are  reported.  Education:   The patient graduated from high school and was admitted into the [employer] [place] and had a ACT of 30.  He did not complete his first semester of college.  Medical History:  No past medical history on file.       Patient Active Problem List   Diagnosis Date Noted   Hypogonadism in male 01/15/2021     Psychiatric History:  The patient reports that while he had difficulty getting all of his work done through high school that he did fairly well although he may have had some attentional issues and difficulties with writing.  However, he did well enough to complete high school and get admitted to college.  His difficulties from a psychiatric perspective really started in his first semester of college between 44 and 45 years of age.  Family Med/Psych History: No family history on file.   Impression/DX:  Matthew Padilla is a 26 year old male referred for psychological/neuropsychological evaluation to facilitate with differential diagnosis.  Considerations with differential diagnosis include paranoid schizophrenia and anxiety versus Asperger's/mild autistic spectrum disorder.  He was referred by his treating psychiatrist Kathryne Sharper, MD.  The patient has also been working with a therapist with behavioral health as well.  The patient does have a history of previous diagnosis of schizophrenia.  The patient was admitted for inpatient hospitalization in 2019 in [place] after developing suicidal thoughts related to crashing his car.  There was never any attempt to harm self.  Patient had a second inpatient hospitalization in October 2022 here in [place] at [facility] because of development of bizarre behavior and psychosis.  Patient was initially put on Haldol but stopped due to excessive drooling and sedation.  Current medications include Zyprexa and Zoloft and the patient reports that he is having a good response to this medication strategy and feels like he is making  progress.  The patient is planning to return back to college soon and is hoping to maintain some stability.  Disposition/Plan:  The patient will complete the [place] multiphasic personality inventory and we also went through symptom checklist and behavioral patterns related to possible Asperger's/mild autism spectrum disorder type symptoms.  Once we get the results from the MMPI completed a formal report will be produced and made available to his referring psychiatrist Kathryne Sharper, MD.  We will also reserve a time to sit down with the patient and go over these results and talk about treatment recommendations but the diagnostic considerations highlight.  Diagnosis:    Schizophrenia, paranoid (HCC)  Generalized anxiety disorder         Electronically  Signed   _______________________ Arley Phenix, Psy.D. Clinical Neuropsychologist

## 2022-04-08 ENCOUNTER — Telehealth (HOSPITAL_BASED_OUTPATIENT_CLINIC_OR_DEPARTMENT_OTHER): Payer: 59 | Admitting: Psychiatry

## 2022-04-08 ENCOUNTER — Encounter (HOSPITAL_COMMUNITY): Payer: Self-pay | Admitting: Psychiatry

## 2022-04-08 ENCOUNTER — Telehealth (HOSPITAL_COMMUNITY): Payer: 59 | Admitting: Psychiatry

## 2022-04-08 DIAGNOSIS — F419 Anxiety disorder, unspecified: Secondary | ICD-10-CM

## 2022-04-08 DIAGNOSIS — F200 Paranoid schizophrenia: Secondary | ICD-10-CM | POA: Diagnosis not present

## 2022-04-08 MED ORDER — OLANZAPINE 7.5 MG PO TABS: ORAL_TABLET | ORAL | 0 refills | Status: DC

## 2022-04-08 MED ORDER — SERTRALINE HCL 50 MG PO TABS: ORAL_TABLET | ORAL | 1 refills | Status: DC

## 2022-04-08 NOTE — Progress Notes (Signed)
Virtual Visit via Telephone Note  I connected with Matthew Padilla on 04/08/22 at  8:20 AM EDT by telephone and verified that I am speaking with the correct person using two identifiers.  Location: Patient: Home Provider: Home Office   I discussed the limitations, risks, security and privacy concerns of performing an evaluation and management service by telephone and the availability of in person appointments. I also discussed with the patient that there may be a patient responsible charge related to this service. The patient expressed understanding and agreed to proceed.   History of Present Illness: Patient is evaluated by telephone session.  His MyChart video is not working.  He reported his paranoia, anger and anxiety is much better since taking the medication.  He is now thinking joining the [employer] in the fall.  He is considering either Administrator to take as a major.  He apologized not having blood work because he is dependent on his mother as he does not have a car.  Recently he had a visit with Dr. Kieth Brightly for psychological testing and he has assessment that he need to complete however he has not done it yet.  He is sleeping okay.  He started to walk at least twice a week by himself and also goes to grocery stores with his mother.  Denies any suicidal thoughts.  He lives with his mother.  Denies any tremors, shakes or any EPS.  Since Darren left he has not able to see a therapist but like to schedule and get appointment in our office.  He reported his weight is unchanged from the past.  He denies drinking or using any illegal substances.  He has a primary care physician [place] however has not seen in a while and has no blood work done recently.  Past Psychiatric History: H/O schizophrenia.  Admitted in 2019 at [facility] after having suicidal thoughts to crash his car but no intent.  D/C on REXULTI and Wellbutrin but stopped  after 4 weeks as no changed. H/O inpatient in October 2022 at [facility] for bizarre behavior and psychosis.  Given Haldol but stopped due to excessive drooling and sedation.   Psychiatric Specialty Exam: Physical Exam  Review of Systems  Weight (!) 340 lb (154.2 kg).Body mass index is 42.5 kg/m.  General Appearance: NA  Eye Contact:  NA  Speech:  Slow  Volume:  Decreased  Mood:  Euthymic  Affect:  NA  Thought Process:  Descriptions of Associations: Intact  Orientation:  Full (Time, Place, and Person)  Thought Content:  WDL  Suicidal Thoughts:  No  Homicidal Thoughts:  No  Memory:  Immediate;   Fair Recent;   Fair Remote;   Fair  Judgement:  Fair  Insight:  Present  Psychomotor Activity:  NA  Concentration:  Concentration: Fair and Attention Span: Fair  Recall:  [employer] of Knowledge:  Fair  Language:  Good  Akathisia:  No  Handed:  Right  AIMS (if indicated):     Assets:  Communication Skills Desire for Improvement Housing Social Support  ADL's:  Intact  Cognition:  WNL  Sleep:   ok      Assessment and Plan: Schizophrenia chronic paranoid type.  Anxiety.  Rule out Asperger syndrome.  I reviewed notes from Dr. Kieth Brightly.  His initial assessment more towards the diagnosis of schizophrenia paranoid but he will need more testing to rule out Asperger.  Patient is working on his assessment.  We talk again about his blood work which is very important since he is taking olanzapine and that required blood work especially hemoglobin A1c.  Patient promised to have it done very soon.  He has no concerns from the medication.  Patient also reminded him to schedule appointment with his PCP.  He has a history of hypogonadism.  Encouraged to continue to work with Dr. Kieth Brightly to establish the diagnosis.  We will refer him to see a therapist in our office.  We will continue olanzapine 7.5 mg at bedtime however we will provide only 30 days until we receive the blood work results.   Continue Zoloft 50 mg daily.  Recommended to call us back if is any question or any concern.  Follow-up in 2 months.    Follow Up Instructions:    I discussed the assessment and treatment plan with the patient. The patient was provided an opportunity to ask questions and all were answered. The patient agreed with the plan and demonstrated an understanding of the instructions.   The patient was advised to call back or seek an in-person evaluation if the symptoms worsen or if the condition fails to improve as anticipated.  Collaboration of Care: Primary Care Provider AEB recommend to establish care with his primary care physician for annual physical checkup.  Patient/Guardian was advised Release of Information must be obtained prior to any record release in order to collaborate their care with an outside provider. Patient/Guardian was advised if they have not already done so to contact the registration department to sign all necessary forms in order for [place] to release information regarding their care.   Consent: Patient/Guardian gives verbal consent for treatment and assignment of benefits for services provided during this visit. Patient/Guardian expressed understanding and agreed to proceed.    I provided 25 minutes of non-face-to-face time during this encounter.   Cleotis Nipper, MD

## 2022-05-20 ENCOUNTER — Telehealth (HOSPITAL_COMMUNITY): Payer: Self-pay | Admitting: Licensed Clinical Social Worker

## 2022-05-20 ENCOUNTER — Ambulatory Visit (INDEPENDENT_AMBULATORY_CARE_PROVIDER_SITE_OTHER): Payer: Self-pay | Admitting: Licensed Clinical Social Worker

## 2022-05-20 DIAGNOSIS — Z91199 Patient's noncompliance with other medical treatment and regimen due to unspecified reason: Secondary | ICD-10-CM

## 2022-05-20 NOTE — Telephone Encounter (Signed)
LCSW counselor attempted to connect with patient for scheduled appointment via MyChart video text request x 2 and email request with no response; also attempted to connect via phone without success. LCSW counselor left message for patient to call office number to reschedule OPT appointment.   Attempt 1: Text and email:2:03p  Attempt 2: Text and email:2:06p  Attempt 3: phone call: 2:07pm LMVM on mother's phone--listed as primary contact number

## 2022-05-20 NOTE — Progress Notes (Signed)
LCSW counselor attempted to connect with patient for scheduled appointment via MyChart video text request x 2 and email request with no response; also attempted to connect via phone without success. LCSW counselor left message for patient to call office number to reschedule OPT appointment.   Attempt 1: Text and email:2:03p  Attempt 2: Text and email:2:06p  Attempt 3: phone call: 2:07pm LMVM on mother's phone--listed as primary contact number 

## 2022-06-04 ENCOUNTER — Encounter: Payer: 59 | Attending: Psychology | Admitting: Psychology

## 2022-06-04 DIAGNOSIS — F200 Paranoid schizophrenia: Secondary | ICD-10-CM | POA: Diagnosis present

## 2022-06-04 DIAGNOSIS — F411 Generalized anxiety disorder: Secondary | ICD-10-CM | POA: Diagnosis not present

## 2022-06-04 NOTE — Progress Notes (Signed)
Neuropsychological Evaluation   Patient:  Matthew Padilla   DOB: 1996/08/16  MR Number: [id number]  Location: [facility]IC[facility] REHABILITATION [address] [place], [address] [id number] [facility] [place] [address] Dept: [phone]  Start: 3 PM End: 4 PM  Provider/Observer:     Hershal Coria PsyD  Chief Complaint:      Chief Complaint  Patient presents with   Agitation   Paranoid   Other    Attentional issues and coping difficulties    Reason For Service:     Matthew Padilla is a 26 year old male referred for psychological/neuropsychological evaluation to facilitate with differential diagnosis.  Considerations with differential diagnosis include paranoid schizophrenia and anxiety versus Asperger's/mild autistic spectrum disorder.  He was referred by his treating psychiatrist Kathryne Sharper, MD.  The patient has also been working with a therapist with behavioral health as well.  The patient does have a history of previous diagnosis of schizophrenia.  The patient was admitted for inpatient hospitalization in 2019 in [place] after developing suicidal thoughts related to crashing his car.  There was never any attempt to harm self.  Patient had a second inpatient hospitalization in October 2022 here in [place] at [facility] because of development of bizarre behavior and psychosis.  Patient was initially put on Haldol but stopped due to excessive drooling and sedation.  Current medications include Zyprexa and Zoloft and the patient reports that he is having a good response to this medication strategy and feels like he is making progress.  The patient is planning to return back to college soon and is hoping to maintain some stability.  The patient provides some detail around the development of his difficulties.  The patient reports that they really began to manifest in ways that caused significant impact on his life  around the age of [age].  The patient had completed high school and started classes at the [employer] [place].  He was also participating in marching band.  While the patient struggled with getting all of his class work done in high school and did not make a particularly good grades he did make a 30 on the ACT.  He is clearly a very bright and intelligent young man.  The patient continued to struggle with getting his work done.  In October of his first year of college he really started noticing changes and it became increasingly hard to navigate his [place] experience.  The patient reports that he was in the dorms and he did not get along very well with his roommate and they regularly clashed.  He reports that he could not get any piece.  He left school halfway through the semester and did not pass any of his classes.  He was not allowed to return to school but was given some options for how to get his return.  He and his parents decided not to pursue his return to the [employer] at that time.  After leaving school, he has worked several jobs since then.  He worked at a [employer] for a while and then [employer].  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  The patient reports that he did much better there due to not having to interact with many people.  The patient's family recently moved to  [place] this past September.  The patient is not gotten a job but is working on returning to school.  The patient reports that with medication changes that he is doing much better.  The patient reports that when he has had his difficulties that he had significant difficulty keeping his thoughts organized and clear.  He would misinterpret things and become very focused on certain aspects of situations and had difficulty with reality checking.  The patient would make connections and  feels certain things were very important that others would not see as important.  He reports that he would get in an increasingly stressed state and have negative feelings.  The patient reports that as far as he knows there is no family history of schizophrenia or autism but has been told that his great uncle was diagnosed with schizophrenia.  The patient reports that he is sleeping well now and is doing better.  He has also recently started becoming more physically active.  Testing Administered:  [place] multiphasic personality inventory-II  Participation Level:   Active  Participation Quality:  Appropriate      Behavioral Observation:  Well Groomed, Alert, and Appropriate.   Test Results:   To objectively assess a wide range of psychological/psychiatric relevant variables the patient completed the [place] multiphasic personality inventory.  During the formal clinical interview conducted on 03/23/2022 the patient reported that while he did have some difficulties getting work completed etc. in high school and earlier that is difficulties and issues really did not begin until he started college.  The patient did describe a great deal of stress particularly with regard to his living situation in the dorms attributed directly to his assigned roommate.  When asked about obsessive-compulsive type symptoms and difficulties with sensory input in elementary through high school the patient did not describe or report a great deal of difficulties in these domains.  With regard to the specific results of the MMPI-2 we initially evaluated the resulting validity scales.  While the patient tended to minimize and limit his description of particular negative traits on this inventory he did produce a valid assessment and this minimization of some traits is taking into account in its interpretation of clinical scales.  On the resulting primary clinical scales the patient did show elevations with regard to certain  depressive type symptoms as well as anxiety and difficulties modulating in managing his thoughts and feelings in general.  The patient denied any manic type symptoms but did acknowledge some degree of underlying anger and frustration which he tends to repressed to a certain degree.  However it is likely that when he engages in trying to manage and not externally expresses anger and frustration that it will likely boil over at less than ideal times.  Further interpretation looking at content and supplemental scales do show some relative increase in anxiety and stress related traits.  The patient has a subclinical elevation with regard to obsessive/intrusive thoughts.  The patient denies any current symptoms related to hallucinations or delusional type thinking and denies externalizing features of anger or aggressive type behaviors.  The patient most readily identifies difficulties with feeling very uncomfortable and disconnected in social situations but this does not carryover to family-based situations and he feels comfortable and at ease with family members themselves.  The patient tends to engage in over control of frustration and feelings of hostility and does not appear to have a particularly good or healthy capacity to express them an appropriate timely manner again raising the  opportunity for them to come out at inopportune times.  Patient clearly identifies elements related to social avoidance and stress and social situations.  He tends to be very shy and self-conscious and controlling of his external behaviors.  Depressive symptoms are primarily related to psychomotor slowing and complaints of physical nature rather than subjective feelings of sadness and anhedonia.  Also of importance the patient readily endorses symptoms and features consistent with difficulty maintaining mastery over his thoughts feelings and has times with difficulty understanding why he reacts and engages the way he does.  He feels  not only alienated from others but also alienated and confused by his own thoughts and feelings.   Impression/Diagnosis:   The results of the current neuropsychological evaluation including review of available medical records, and extended clinical interview and objective assessment utilizing the [place] multiphasic personality inventory do present a symptom picture and history much more consistent with a diagnosis of schizophrenia paranoid subtype and anxiety type symptoms rather than an autistic spectrum-like disorder.  Anxiety and sensory processing issues do not appear to be the primary type clinical features driving his symptoms but rather difficulty with mastery over his own thoughts and feelings and difficulty interpreting other people's intentions and behaviors.  The patient acknowledges not always interpreting other peoples intentions consistent with how he is described their intentions by others.  The patient denies symptoms consistent with significant depressive symptomatology.  Again, the totality of available information does align much more strongly with the development of symptoms consistent with schizophrenia and are most consistent with a subtype related to paranoid schizophrenia.  The patient has responded well to current medication strategies and reported that he feels like he is doing better and is looking forward to attempting to return to school this fall.  The patient very likely is going to do better if he can live at home rather than a dorm room type setting to minimize some of the external psychosocial stressors.  As of writing this report the patient has not gone back for lab work that will be needed to maintain his current medication regimen and therefore this is something that he is going to need to follow-up with.  The clinical features are not particularly consistent with an autistic spectrum type disorder even though the patient has difficulties understanding and interpreting  others intentions and actions.  However, this is more of a recent development rather than lifelong type of feature in the absence of some of the other symptoms associated with asked Buerger's disorder with further highlighting the likelihood that this is more of a condition related to schizophrenia.  I would recommend that the patient follow-up on connecting with a therapist to continue to build better coping skills and strategies.  It appears that his treating psychiatrist is doing a very good job as far as medication management although the patient will need to follow-up on lab/blood work with these particular medications.  I encouraged the patient to build better coping skills and follow psychiatric care so he can develop and maintain continued increasing effectiveness and life functioning going forward.  He is a very bright individual and has not managed his condition particularly well until recently.  Good sleep and behavioral patterns are important and managing stress will also be important for this condition.  Diagnosis:    Schizophrenia, paranoid (HCC)  Generalized anxiety disorder   _____________________ Arley Phenix, Psy.D. Clinical Neuropsychologist

## 2022-06-09 ENCOUNTER — Telehealth (HOSPITAL_COMMUNITY): Payer: 59 | Admitting: Psychiatry

## 2022-06-11 ENCOUNTER — Encounter: Payer: 59 | Attending: Psychology | Admitting: Psychology

## 2022-06-11 DIAGNOSIS — F200 Paranoid schizophrenia: Secondary | ICD-10-CM | POA: Insufficient documentation

## 2022-06-11 DIAGNOSIS — F411 Generalized anxiety disorder: Secondary | ICD-10-CM | POA: Diagnosis present

## 2022-06-11 NOTE — Progress Notes (Signed)
06/11/2022: 2 PM-3 PM:  Today's visit was an in person visit conducted in my outpatient clinic office.  Today I provided feedback regarding the recent psychological evaluation that was conducted with the patient to facilitate differential diagnosis with primary concerns of autistic spectrum disorder versus schizophrenia.  I have included a copy of the summary/results below for convenience and the entire psychological evaluation can be found in the patient's EMR dated 06/04/2022.  Today we went over these results and I answered any question I could for the patient and describes some of the current thinking around the condition of schizophrenia.  We talked about medication strategies that Dr. Lolly Mustache is already implemented and the patient reports that he is responding well to these medications and planning on returning to school.  We talked about strategies to reduce stress and address issues that may exacerbate his condition overall.  The patient is very shy and did not ask a great deal of questions so I tried to address questions that would be appropriate to answer at this feedback session.    Impression/Diagnosis:                     The results of the current neuropsychological evaluation including review of available medical records, and extended clinical interview and objective assessment utilizing the [place] multiphasic personality inventory do present a symptom picture and history much more consistent with a diagnosis of schizophrenia paranoid subtype and anxiety type symptoms rather than an autistic spectrum-like disorder.  Anxiety and sensory processing issues do not appear to be the primary type clinical features driving his symptoms but rather difficulty with mastery over his own thoughts and feelings and difficulty interpreting other people's intentions and behaviors.  The patient acknowledges not always interpreting other peoples intentions consistent with how he is described their intentions by  others.  The patient denies symptoms consistent with significant depressive symptomatology.  Again, the totality of available information does align much more strongly with the development of symptoms consistent with schizophrenia and are most consistent with a subtype related to paranoid schizophrenia.  The patient has responded well to current medication strategies and reported that he feels like he is doing better and is looking forward to attempting to return to school this fall.  The patient very likely is going to do better if he can live at home rather than a dorm room type setting to minimize some of the external psychosocial stressors.  As of writing this report the patient has not gone back for lab work that will be needed to maintain his current medication regimen and therefore this is something that he is going to need to follow-up with.  The clinical features are not particularly consistent with an autistic spectrum type disorder even though the patient has difficulties understanding and interpreting others intentions and actions.  However, this is more of a recent development rather than lifelong type of feature in the absence of some of the other symptoms associated with asked Buerger's disorder with further highlighting the likelihood that this is more of a condition related to schizophrenia.   I would recommend that the patient follow-up on connecting with a therapist to continue to build better coping skills and strategies.  It appears that his treating psychiatrist is doing a very good job as far as medication management although the patient will need to follow-up on lab/blood work with these particular medications.  I encouraged the patient to build better coping skills and follow psychiatric care so  he can develop and maintain continued increasing effectiveness and life functioning going forward.  He is a very bright individual and has not managed his condition particularly well until  recently.  Good sleep and behavioral patterns are important and managing stress will also be important for this condition.   Diagnosis:                                Schizophrenia, paranoid (HCC)   Generalized anxiety disorder     _____________________ Arley Phenix, Psy.D. Clinical Neuropsychologist

## 2022-06-12 ENCOUNTER — Telehealth (HOSPITAL_COMMUNITY): Payer: Self-pay | Admitting: *Deleted

## 2022-06-12 LAB — HEMOGLOBIN A1C
Est. average glucose Bld gHb Est-mCnc: 111 mg/dL
Hgb A1c MFr Bld: 5.5 % (ref 4.8–5.6)

## 2022-06-12 NOTE — Telephone Encounter (Signed)
Received results for pt's HgbA1C which was drawn on 06/12/22.  HgbA1C  5.5

## 2022-07-06 ENCOUNTER — Telehealth (HOSPITAL_COMMUNITY): Payer: Self-pay | Admitting: Psychiatry

## 2022-07-06 NOTE — Telephone Encounter (Signed)
Dr. Adele Schilder - the pt's mother called to schedule and she also stated that his A1C 5.4//sh

## 2022-07-07 ENCOUNTER — Telehealth (HOSPITAL_BASED_OUTPATIENT_CLINIC_OR_DEPARTMENT_OTHER): Payer: 59 | Admitting: Psychiatry

## 2022-07-07 ENCOUNTER — Encounter (HOSPITAL_COMMUNITY): Payer: Self-pay | Admitting: Psychiatry

## 2022-07-07 DIAGNOSIS — F200 Paranoid schizophrenia: Secondary | ICD-10-CM | POA: Diagnosis not present

## 2022-07-07 DIAGNOSIS — F419 Anxiety disorder, unspecified: Secondary | ICD-10-CM | POA: Diagnosis not present

## 2022-07-07 MED ORDER — SERTRALINE HCL 50 MG PO TABS: ORAL_TABLET | ORAL | 2 refills | Status: DC

## 2022-07-07 MED ORDER — OLANZAPINE 7.5 MG PO TABS: ORAL_TABLET | ORAL | 2 refills | Status: DC

## 2022-07-07 NOTE — Progress Notes (Signed)
Virtual Visit via Video Note  I connected with Matthew Padilla on 07/07/22 at  3:20 PM EDT by a video enabled telemedicine application and verified that I am speaking with the correct person using two identifiers.  Location: Patient: Home Provider: Home Office   I discussed the limitations of evaluation and management by telemedicine and the availability of in person appointments. The patient expressed understanding and agreed to proceed.  History of Present Illness: Patient is evaluated by video session.  He is taking olanzapine and Zoloft which is helping his paranoia, anxiety and he denies any recent hallucination.  He is compliant with the therapy with [facility].  He had psychological testing with Dr. Sima Matas who diagnosed with schizophrenia and anxiety.  He is happy because he is going to start taking in person and online school at [employer].  He will start taking classes in first week of January.  He lives with his mother who is very cooperative.  Denies any tremors, shakes or any EPS.  He admitted significant weight gain and hoping to address this issue with his upcoming appointment with his PCP [place].  We have recommended blood work and he had a blood work earlier this month and his hemoglobin A1c is 5.5.  He started walking every day and at least go to his mother to the grocery store 2 times a week.  He wants to keep the current medication.  Past Psychiatric History: H/O schizophrenia.  Admitted in 2019 at [facility] after having suicidal thoughts to crash his car but no intent.  D/C on REXULTI and Wellbutrin but stopped after 4 weeks as no changed. H/O inpatient in October 2022 at [facility] for bizarre behavior and psychosis.  Given Haldol but stopped due to excessive drooling and sedation.   Recent Results (from the past 2160 hour(s))  HgB A1c     Status: None   Collection Time: 06/11/22  2:47 PM  Result Value Ref Range   Hgb A1c  MFr Bld 5.5 4.8 - 5.6 %    Comment:          Prediabetes: 5.7 - 6.4          Diabetes: >6.4          Glycemic control for adults with diabetes: <7.0    Est. average glucose Bld gHb Est-mCnc 111 mg/dL     Psychiatric Specialty Exam: Physical Exam  Review of Systems  Weight (!) 360 lb (163.3 kg).There is no height or weight on file to calculate BMI.  General Appearance: Casual  Eye Contact:  Fair  Speech:  Slow  Volume:  Decreased  Mood:  Euthymic  Affect:  Congruent  Thought Process:  Goal Directed  Orientation:  Full (Time, Place, and Person)  Thought Content:  WDL  Suicidal Thoughts:  No  Homicidal Thoughts:  No  Memory:  Immediate;   Fair Recent;   Fair Remote;   Fair  Judgement:  Intact  Insight:  Present  Psychomotor Activity:  Normal  Concentration:  Concentration: Fair and Attention Span: Fair  Recall:  [employer] of Knowledge:  Fair  Language:  Good  Akathisia:  No  Handed:  Right  AIMS (if indicated):     Assets:  Communication Skills Desire for Improvement Housing Social Support  ADL's:  Intact  Cognition:  WNL  Sleep:   ok      Assessment and Plan: Schizophrenia chronic paranoid type.  Anxiety.  Patient is stable on his  current medication.  I reviewed blood work results.  His hemoglobin A1c is 5.4.  Continue olanzapine 7.5 mg at bedtime and Zoloft 50 mg daily.  I encouraged to keep appointment with PCP and consider weight loss program.  Patient has no issues with the medication.  Recommended to call us back if is any question or any concern.  Follow-up in 3 months.  Patient is going to start school in first week of January and he is excited about it.  Follow Up Instructions:    I discussed the assessment and treatment plan with the patient. The patient was provided an opportunity to ask questions and all were answered. The patient agreed with the plan and demonstrated an understanding of the instructions.   The patient was advised to call back or  seek an in-person evaluation if the symptoms worsen or if the condition fails to improve as anticipated.  Collaboration of Care: Primary Care Provider AEB notes are available in epic to review.  Patient/Guardian was advised Release of Information must be obtained prior to any record release in order to collaborate their care with an outside provider. Patient/Guardian was advised if they have not already done so to contact the registration department to sign all necessary forms in order for [place] to release information regarding their care.   Consent: Patient/Guardian gives verbal consent for treatment and assignment of benefits for services provided during this visit. Patient/Guardian expressed understanding and agreed to proceed.    I provided 22 minutes of non-face-to-face time during this encounter.   Cleotis Nipper, MD

## 2022-07-21 ENCOUNTER — Ambulatory Visit: Payer: 59 | Admitting: Psychology

## 2022-07-29 ENCOUNTER — Telehealth (HOSPITAL_COMMUNITY): Payer: Self-pay | Admitting: *Deleted

## 2022-07-29 NOTE — Telephone Encounter (Signed)
Pt's mother, Caryl Asp, called with c/o excessive weight gain r/t the Zyprexa. Mother says pt wants to try a different medication. Pt next scheduled appointment is on 10/07/22. Please review and advise.

## 2022-07-30 NOTE — Telephone Encounter (Signed)
He had tried REXULTI that did not help and Haldol because excessive drooling.  Not sure if his insurance will cover Arman Filter, [place] or Saphris.  He can check his insurance if any of these medicine cover if not then we can try Risperdal 1 mg at bedtime.

## 2022-07-31 ENCOUNTER — Other Ambulatory Visit (HOSPITAL_COMMUNITY): Payer: Self-pay | Admitting: *Deleted

## 2022-07-31 MED ORDER — RISPERIDONE 1 MG PO TABS: 1.0000 mg | ORAL_TABLET | Freq: Every day | ORAL | 0 refills | Status: DC

## 2022-08-12 ENCOUNTER — Encounter (HOSPITAL_COMMUNITY): Payer: Self-pay

## 2022-08-12 ENCOUNTER — Ambulatory Visit (INDEPENDENT_AMBULATORY_CARE_PROVIDER_SITE_OTHER): Payer: 59 | Admitting: Licensed Clinical Social Worker

## 2022-08-12 DIAGNOSIS — F200 Paranoid schizophrenia: Secondary | ICD-10-CM

## 2022-08-12 DIAGNOSIS — F419 Anxiety disorder, unspecified: Secondary | ICD-10-CM | POA: Diagnosis not present

## 2022-08-12 NOTE — Progress Notes (Unsigned)
Virtual Visit via Video Note  I connected with Matthew Padilla on 08/12/22 at  8:00 AM EST by a video enabled telemedicine application and verified that I am speaking with the correct person using two identifiers.  Location: Patient: HOME Provider: [facility]   I discussed the limitations of evaluation and management by telemedicine and the availability of in person appointments. The patient expressed understanding and agreed to proceed.   I discussed the assessment and treatment plan with the patient. The patient was provided an opportunity to ask questions and all were answered. The patient agreed with the plan and demonstrated an understanding of the instructions.   The patient was advised to call back or seek an in-person evaluation if the symptoms worsen or if the condition fails to improve as anticipated.  I provided 55 minutes of non-face-to-face time during this encounter.   Ernest Haber Mazell Aylesworth, LCSW Comprehensive Clinical Assessment (CCA) Note  08/12/2022 Matthew Padilla [id number]  Chief Complaint:  Chief Complaint  Patient presents with   Establish Care   Visit Diagnosis:  Encounter Diagnoses  Name Primary?   Schizophrenia, paranoid (HCC) Yes   Anxiety      CCA Screening, Triage and Referral (STR)  Patient Reported Information How did you hear about [place]? Other (Comment)  Referral name: Dr. Kathryne Sharper  Referral phone number: No data recorded  Whom do you see for routine medical problems? Primary Care  Practice/Facility Name: No data recorded Practice/Facility Phone Number: No data recorded Name of Contact: No data recorded Contact Number: No data recorded Contact Fax Number: No data recorded Prescriber Name: No data recorded Prescriber Address (if known): No data recorded  What Is the Reason for Your Visit/Call Today? Matthew Padilla is a 26 yo male reporting virtually to [facility] for establishment of outpatient psychotherapy  services. Pt reports that he is currently under the psychiatric care of Dr. Kathryne Sharper. Pt currently takes sertraline, resperidone for management of schizophrenia symptoms. Pt reports that he has had inpatient psychiatric hospitalizations x 2. Pt states that he was at [facility] in 2022 for 2 weeks. Pt reports that he was hospitalized in [place] 4 years ago for 2 weeks. Pt reports that he has had counseling in the past, and didn't find it very helpful. Pt currently resides with his parents and 60 yo sister. Pt reports that he has an older sister that resides in [place].  Pt reports that he has some minor family conflict involving his younger sister, but its rare. Pt denies any current SI, HI, or AVH.  Pt denies any substance use--states he used to vape nicotine 2 yrs ago but nothing since then. Pt denies any ongoing medical concerns, denies chronic pain, and denies any significant external stressors at this current point in time. Pt reports his goals for counseling include taking steps to find a job, losing weight/healthier behaviors, and preparing to start classes in the spring.  How Long Has This Been Causing You Problems? > than 6 months  What Do You Feel Would Help You the Most Today? Treatment for Depression or other mood problem   Have You Recently Been in Any Inpatient Treatment (Hospital/Detox/Crisis Center/28-Day Program)? No  Name/Location of Program/Hospital:No data recorded How Long Were You There? No data recorded When Were You Discharged? No data recorded  Have You Ever Received Services From [facility] Before? Yes  Who Do You See at [facility]? Dr. Kathryne Sharper   Have You Recently Had Any Thoughts About Hurting Yourself?  No  Are You Planning to Commit Suicide/Harm Yourself At This time? No   Have you Recently Had Thoughts About Hurting Someone Karolee Ohslse? No  Explanation: n/a   Have You Used Any Alcohol or Drugs in the Past 24 Hours? No  How Long Ago Did You Use  Drugs or Alcohol? No data recorded What Did You Use and How Much? n/a   Do You Currently Have a Therapist/Psychiatrist? Yes  Name of Therapist/Psychiatrist: Dr. Kathryne SharperSyed Arfeen   Have You Been Recently Discharged From Any Office Practice or Programs? No  Explanation of Discharge From Practice/Program: n/a     CCA Screening Triage Referral Assessment Type of Contact: Tele-Assessment  Is this Initial or Reassessment? Initial Assessment  Date Telepsych consult ordered in [facility]:  08/12/22  Time Telepsych consult ordered in [facility]:  0000   Patient Reported Information Reviewed? No data recorded Patient Left Without Being Seen? No data recorded Reason for Not Completing Assessment: No data recorded  Collateral Involvement: EPIC REVIEW   Does Patient Have a Automotive engineerCourt Appointed Legal Guardian? No data recorded Name and Contact of Legal Guardian: No data recorded If Minor and Not Living with Parent(s), Who has Custody? N/A--PT IS NOT A MINOR  Is CPS involved or ever been involved? Never  Is APS involved or ever been involved? Never   Patient Determined To Be At Risk for Harm To Self or Others Based on Review of Patient Reported Information or Presenting Complaint? No  Method: No Plan  Availability of Means: No access or NA  Intent: Vague intent or NA  Notification Required: No need or identified person  Additional Information for Danger to Others Potential: -- (NONE)  Additional Comments for Danger to Others Potential: N/A  Are There Guns or Other Weapons in Your Home? No  Types of Guns/Weapons: N/A  Are These Weapons Safely Secured?                            -- (N/A)  Who Could Verify You Are Able To Have These Secured: N/A  Do You Have any Outstanding Charges, Pending Court Dates, Parole/Probation? TRAFFIC TICKET IN [place] THAT IS UNRESOLVED  Contacted To Inform of Risk of Harm To Self or Others: Other: Comment (N/A)   Location of Assessment: Other (comment) ([facility]  [place])   Does Patient Present under Involuntary Commitment? No  IVC Papers Initial File Date: No data recorded  [place]   Patient Currently Receiving the Following Services: Medication Management   Determination of Need: Routine (7 days)   Options For Referral: Outpatient Therapy; Medication Management     CCA Biopsychosocial Intake/Chief Complaint:  establishment of outpatient psychotherapy  Current Symptoms/Problems: communication skills, social skills, social anxiety   Patient Reported Schizophrenia/Schizoaffective Diagnosis in Past: Yes   Strengths: pt has self awareness; pt has good family supports; pt has good physical health  Preferences: outpatient psychiatric supports  Abilities: pt enjoys reading; enjoys playing sudoku   Type of Services Patient Feels are Needed: medication management; outpatient psychotherapy   Initial Clinical Notes/Concerns: pt very pleasant and cooperative   Mental Health Symptoms Depression:   Weight gain/loss (pt reports weight gain)   Duration of Depressive symptoms:  Greater than two weeks   Mania:   None   Anxiety:    Worrying ("I worry about the future")   Psychosis:   Hallucinations (pt reports auditory hallucinations at times)   Duration of Psychotic symptoms:  Greater than  six months   Trauma:   Guilt/shame; Hypervigilance; Avoids reminders of event   Obsessions:   None   Compulsions:   None   Inattention:   None   Hyperactivity/Impulsivity:   None   Oppositional/Defiant Behaviors:   None   Emotional Irregularity:   None   Other Mood/Personality Symptoms:  No data recorded   Mental Status Exam Appearance and self-care  Stature:   -- (unable to assess--session completed by phone)   Weight:   -- (unable to assess--session completed by phone)   Clothing:   -- (unable to assess--session completed by phone)   Grooming:   -- (unable to assess--session completed by  phone)   Cosmetic use:   -- (unable to assess--session completed by phone)   Posture/gait:   -- (unable to assess--session completed by phone)   Motor activity:   -- (unable to assess--session completed by phone)   Sensorium  Attention:   Normal   Concentration:   Normal   Orientation:   X5   Recall/memory:   Normal   Affect and Mood  Affect:   Anxious   Mood:   Anxious; Dysphoric   Relating  Eye contact:   -- (unable to assess--session completed by phone)   Facial expression:   -- (unable to assess--session completed by phone)   Attitude toward examiner:   Cooperative   Thought and Language  Speech flow:  Clear and Coherent   Thought content:   Appropriate to Mood and Circumstances   Preoccupation:   None   Hallucinations:   Auditory   Organization:  No data recorded  [employer] of Knowledge:   Good   Intelligence:   Average   Abstraction:   Functional   Judgement:   Normal   Reality Testing:   Realistic   Insight:   Gaps   Decision Making:   Vacilates   Social Functioning  Social Maturity:   Impulsive   Social Judgement:   Normal   Stress  Stressors:   Family conflict; Work ("no real stressors--I guess finding a job. I fight with my sister sometimes but I think that's pretty normal")   Coping Ability:   Normal   Skill Deficits:   Decision making; Communication   Supports:   Family     Religion: Religion/Spirituality Are You A Religious Person?: No (my family is religious but i'm not) How Might This Affect Treatment?: n/a  Leisure/Recreation: Leisure / Recreation Do You Have Hobbies?: Yes Leisure and Hobbies: reading, sudoku, ps4 video games  Exercise/Diet: Exercise/Diet Do You Exercise?: Yes (pt reports he walks 1.5 miles per day both my himself and with his father on Fri and Mon) What Type of Exercise Do You Do?: Run/Walk How Many Times a Week Do You Exercise?: 6-7 times a week Have You  Gained or Lost A Significant Amount of Weight in the Past Six Months?: Yes-Gained (pt feels meds triggered weight gain) Number of Pounds Gained: 10 Do You Follow a Special Diet?: No Do You Have Any Trouble Sleeping?: No Explanation of Sleeping Difficulties: pt reports that he sleeps from 11pm -8am   CCA Employment/Education Employment/Work Situation: Employment / Work Situation Employment Situation: Unemployed Patient's Job has Been Impacted by Current Illness: No What is the Longest Time Patient has Held a Job?: pt is currently seeking work Has Patient ever Been in Equities trader?: No  Education: Education Is Patient Currently Attending School?: No Last Grade Completed: 12 Name of High School: n/a Did Garment/textile technologist  From High School?: Yes Did You Attend College?: Yes What Type of College Degree Do you Have?: Pt went to school one year at [employer] [place]--became reclusing and stopped taking care of himself Did You Attend Graduate School?: No What Was Your Major?: n/a Did You Have Any Special Interests In School?: n/a Did You Have An Individualized Education Program (IIEP): No Did You Have Any Difficulty At School?: Yes (difficulty with organizational skills/time management/self management) Were Any Medications Ever Prescribed For These Difficulties?: No Patient's Education Has Been Impacted by Current Illness: Yes How Does Current Illness Impact Education?: Pt wants to develop skills that will be helpful if he decides to take classes in the Spring   CCA Family/Childhood History Family and Relationship History: Family history Marital status: Single Does patient have children?: No  Childhood History:  Childhood History By whom was/is the patient raised?: Both parents Additional childhood history information: pt lives with parents. Pt lived in [place] and moved to [place] 4 years ago. Parents still together, sister in [place] area. One sister at home. Description of patient's  relationship with caregiver when they were a child: pt reports stable childhood with parents--no DV or exposure to abuse Patient's description of current relationship with people who raised him/her: good relationship w/ parents How were you disciplined when you got in trouble as a child/adolescent?: pt reports he was disciplined fairly Does patient have siblings?: Yes Number of Siblings: 2 Description of patient's current relationship with siblings: pt reports that he is close w/ older sister in [place]--some conflict w/ younger sister Did patient suffer any verbal/emotional/physical/sexual abuse as a child?: No Did patient suffer from severe childhood neglect?: No Has patient ever been sexually abused/assaulted/raped as an adolescent or adult?: No Was the patient ever a victim of a crime or a disaster?: No Witnessed domestic violence?: No Has patient been affected by domestic violence as an adult?: No  Child/Adolescent Assessment:     CCA Substance Use Alcohol/Drug Use: Alcohol / Drug Use Pain Medications: SEE MAR Prescriptions: SEE MAR Over the Counter: SEE MAR History of alcohol / drug use?: Yes Longest period of sobriety (when/how long): NO SUBSTANCE USE OTHER THAN NICOTINE Negative Consequences of Use:  (NONE) Withdrawal Symptoms: None Substance #1 Name of Substance 1: NICOTINE 1 - Age of First Use: 19 1 - Amount (size/oz): VARIABLE 1 - Frequency: VARIABLE 1 - Duration: VARIABLE 1 - Last Use / Amount: 2 YEARS AGO 1 - Method of Aquiring: LEGAL 1- Route of Use: ORAL SMOKE     ASAM's:  Six Dimensions of Multidimensional Assessment  Dimension 1:  Acute Intoxication and/or Withdrawal Potential:   Dimension 1:  Description of individual's past and current experiences of substance use and withdrawal: NO CURRENT SUBSTANCE USE  Dimension 2:  Biomedical Conditions and Complications:      Dimension 3:  Emotional, Behavioral, or Cognitive Conditions and Complications:     Dimension  4:  Readiness to Change:     Dimension 5:  Relapse, Continued use, or Continued Problem Potential:     Dimension 6:  Recovery/Living Environment:     ASAM Severity Score: ASAM's Severity Rating Score: 0  ASAM Recommended Level of Treatment: ASAM Recommended Level of Treatment: Level I Outpatient Treatment   Substance use Disorder (SUD) Substance Use Disorder (SUD)  Checklist Symptoms of Substance Use:  (NONE)  Recommendations for Services/Supports/Treatments: Recommendations for Services/Supports/Treatments Recommendations For Services/Supports/Treatments: Individual Therapy, Medication Management  DSM5 Diagnoses: Encounter Diagnoses  Name Primary?   Schizophrenia, paranoid (HCC)  Yes   Anxiety     Patient Centered Plan: Patient is on the following Treatment Plan(s):  Anxiety   Referrals to Alternative Service(s): Referred to Alternative Service(s):   Place:   Date:   Time:    Referred to Alternative Service(s):   Place:   Date:   Time:    Referred to Alternative Service(s):   Place:   Date:   Time:    Referred to Alternative Service(s):   Place:   Date:   Time:      Collaboration of Care: Other pt to continue care with psychiatrist of record, Dr. Kathryne Sharper.  Patient/Guardian was advised Release of Information must be obtained prior to any record release in order to collaborate their care with an outside provider. Patient/Guardian was advised if they have not already done so to contact the registration department to sign all necessary forms in order for [place] to release information regarding their care.   Consent: Patient/Guardian gives verbal consent for treatment and assignment of benefits for services provided during this visit. Patient/Guardian expressed understanding and agreed to proceed.   Marigrace Mccole R Breionna Punt, LCSW

## 2022-08-12 NOTE — Plan of Care (Signed)
  Problem: Anxiety  Goal: Reduce overall frequency, intensity, and duration of the anxiety so that daily functioning is not impaired per pt self report 3 out of 5 sessions documented.   Outcome: Initial Goal: Learn and implement coping skills that result in a reduction of anxiety and worry, and improve daily functioning per pt report 3 out of 5 sessions documented  Outcome: Initial   Developed/revised tx plan based on pt self reported input. Pt verbally agrees with treatment plan at time of session

## 2022-08-27 ENCOUNTER — Other Ambulatory Visit (HOSPITAL_COMMUNITY): Payer: Self-pay | Admitting: Psychiatry

## 2022-08-31 ENCOUNTER — Telehealth (HOSPITAL_COMMUNITY): Payer: Self-pay

## 2022-08-31 MED ORDER — RISPERIDONE 1 MG PO TABS: 1.0000 mg | ORAL_TABLET | Freq: Every day | ORAL | 0 refills | Status: DC

## 2022-08-31 NOTE — Telephone Encounter (Signed)
Received fax from [facility] requesting a refill.     Disp Refills Start End   risperiDONE (RISPERDAL) 1 MG tablet 30 tablet 0 07/31/2022 07/31/2023   Sig - Route: Take 1 tablet (1 mg total) by mouth at bedtime. - Oral   Sent to pharmacy as: risperiDONE (RISPERDAL) 1 MG tablet   E-Prescribing Status: Receipt confirmed by pharmacy (07/31/2022 11:58 AM EDT)

## 2022-08-31 NOTE — Telephone Encounter (Signed)
Done. Please inform patient to pick up.

## 2022-09-08 ENCOUNTER — Encounter: Payer: Self-pay | Admitting: Internal Medicine

## 2022-09-08 ENCOUNTER — Ambulatory Visit (INDEPENDENT_AMBULATORY_CARE_PROVIDER_SITE_OTHER): Payer: 59 | Admitting: Internal Medicine

## 2022-09-08 VITALS — BP 120/82 | HR 86 | Ht 75.0 in | Wt 393.4 lb

## 2022-09-08 DIAGNOSIS — R7989 Other specified abnormal findings of blood chemistry: Secondary | ICD-10-CM

## 2022-09-08 NOTE — Progress Notes (Signed)
Name: Matthew Padilla  MRN/ DOB: [id number], 1996/09/23    Age/ Sex: 26 y.o., male    PCP: Collene Mares, [place]   Reason for Endocrinology Evaluation: Hypogonadism      Date of Initial Endocrinology Evaluation: 01/14/2021    HPI: [name] is a 26 y.o. male with a past medical history of Depression . The patient presented for initial endocrinology clinic visit on 01/14/2021 for consultative assistance with his hypogonadism .    He presented to his PCP in 10/2020  For evaluation of severe depression, lack of motivation and mood changes. Mother asked for evaluation for klinefelter syndrome  Which prompted lab work showing normal TSH 1.98 uIU/mL , low total testosterone at 204 ng/dL ( reference  [id number])  but normal free testosterone 14.4 pg/dL ( reference 1.6-07.3) normal LH and FSH at 6.7 and 1.5 respectively.    Of note the pt was first noted with depression when he entered [place] [employer] at age 46 which resulted in expelling him due to the fact that he stopped going to classes. He was not treated for depression until a couple years later when he was admitted for in-patient facility, pt was non compliant with anti depressants after discharge.     Moved from [place] ~2021 No learning difficulties  He went through puberty at age 56   Has two sisters      SUBJECTIVE:    Today (09/08/22):  Matthew Padilla is here for further evaluation of hypogonadism.  He was last seen in April 2022, he was supposed to return for fasting labs but he was lost to follow-up.   Since his last visit here he has establish with psychiatry and have been diagnosed with paranoid schizophrenia.  He is accompanied by his mother today   Denies erections Denies spontaneous erections  Hair growth is slow, shaves on average once a week  Testicular size stable  Weight stable for the past few months  Has tobacco use or marijuana use  Denies headaches or changes in vision        HISTORY:  Past Medical History: No past medical history on file. Past Surgical History:   Social History:  reports that he has never smoked. He has never used smokeless tobacco. He reports that he does not currently use alcohol. Family History: family history is not on file.   HOME MEDICATIONS: Allergies as of 09/08/2022   No Known Allergies      Medication List        Accurate as of September 08, 2022  3:36 PM. If you have any questions, ask your nurse or doctor.          STOP taking these medications    OLANZapine 7.5 MG tablet Commonly known as: ZYPREXA Stopped by: Scarlette Shorts, MD       TAKE these medications    risperiDONE 1 MG tablet Commonly known as: RisperDAL Take 1 tablet (1 mg total) by mouth at bedtime.   sertraline 50 MG tablet Commonly known as: ZOLOFT TAKE 1 TABLET(50 MG) BY MOUTH DAILY          REVIEW OF SYSTEMS: A comprehensive ROS was conducted with the patient and is negative except as per HPI     OBJECTIVE:  VS: BP 120/82 (BP Location: Left Arm, Patient Position: Sitting, Cuff Size: Normal)   Pulse 86   Ht 6\' 3"  (1.905 m)   Wt (!) 393 lb 6.4 oz (178.4 kg)   SpO2 98%  BMI 49.17 kg/m     Wt Readings from Last 3 Encounters:  09/08/22 (!) 393 lb 6.4 oz (178.4 kg)  07/09/21 (!) 362 lb (164.2 kg)  01/14/21 (!) 362 lb 2 oz (164.3 kg)     EXAM: General: Pt appears well and is in NAD  Neck: General: Supple without adenopathy. Thyroid: Thyroid size normal.  No goiter or nodules appreciated.  Lungs: Clear with good BS bilat with no rales, rhonchi, or wheezes  Heart: Auscultation: RRR.  Abdomen:  soft, nontender, without masses or organomegaly palpable  Genital : Deferred, last exam was normal   Extremities:  BL LE: No pretibial edema normal ROM and strength.  Skin: Hair: Texture and amount normal with gender appropriate distribution Skin Inspection: No rashes,but has pale violet lateral abdominal striae  Skin  Palpation: Skin temperature, texture, and thickness normal to palpation  Mental Status: Judgment, insight: Intact Orientation: Oriented to time, place, and person Mood and affect: No depression, anxiety, or agitation     DATA REVIEWED: Labs pending    ASSESSMENT/PLAN/RECOMMENDATIONS:   Low total serum testosterone:  -Patient with low total serum testosterone at PCPs office >1.5 yr ago , he was lost to follow up  -Patient returns with similar symptoms of decreased libido, erectile dysfunction -We have again discussed the importance of having labs done 8 AM fasting, patient will return for this    2.  Cushingoid features:  -Given weight gain, fat deposits at the neck, and abdominal wall stria, we will proceed with 24-hour urinary cortisol     Follow-up pending lab results  Signed electronically by: Lyndle Herrlich, MD  [facility] Endocrin[facility]  [facility] [address] [place], [place] [address] Phone: [phone] FAX: [phone]   CC: Collene Mares, [place] [address] [place] [address] [place] [address] Phone: [phone] Fax: [phone]   Return to Endocrinology clinic as below: Future Appointments  Date Time Provider Department Center  09/24/2022  1:00 PM Hussami, Ernest Haber, LCSW [facility] None  10/07/2022  2:20 PM Arfeen, Phillips Grout, MD [facility] None

## 2022-09-08 NOTE — Patient Instructions (Signed)

## 2022-09-10 ENCOUNTER — Other Ambulatory Visit: Payer: 59

## 2022-09-10 DIAGNOSIS — R7989 Other specified abnormal findings of blood chemistry: Secondary | ICD-10-CM

## 2022-09-10 NOTE — Progress Notes (Unsigned)
Total volume 2,400.  Started 09-09-2022 at 9:30 a.m.  ended 09-10-2022 at 9:30 a.m.

## 2022-09-11 ENCOUNTER — Other Ambulatory Visit (INDEPENDENT_AMBULATORY_CARE_PROVIDER_SITE_OTHER): Payer: 59

## 2022-09-11 DIAGNOSIS — R7989 Other specified abnormal findings of blood chemistry: Secondary | ICD-10-CM | POA: Diagnosis not present

## 2022-09-11 LAB — TSH: TSH: 1.98 u[IU]/mL (ref 0.35–5.50)

## 2022-09-11 LAB — COMPREHENSIVE METABOLIC PANEL
ALT: 34 U/L (ref 0–53)
AST: 22 U/L (ref 0–37)
Albumin: 4.6 g/dL (ref 3.5–5.2)
Alkaline Phosphatase: 61 U/L (ref 39–117)
BUN: 14 mg/dL (ref 6–23)
CO2: 26 mEq/L (ref 19–32)
Calcium: 9.9 mg/dL (ref 8.4–10.5)
Chloride: 106 mEq/L (ref 96–112)
Creatinine, Ser: 0.86 mg/dL (ref 0.40–1.50)
GFR: 120.6 mL/min (ref >60.00)
Glucose, Bld: 94 mg/dL (ref 70–99)
Potassium: 4 mEq/L (ref 3.5–5.1)
Sodium: 140 mEq/L (ref 135–145)
Total Bilirubin: 0.9 mg/dL (ref 0.2–1.2)
Total Protein: 7.6 g/dL (ref 6.0–8.3)

## 2022-09-11 LAB — LUTEINIZING HORMONE: LH: 2.57 m[IU]/mL (ref 1.50–9.30)

## 2022-09-11 LAB — T4, FREE: Free T4: 1.16 ng/dL (ref 0.60–1.60)

## 2022-09-11 NOTE — Progress Notes (Unsigned)
Total volume 2,400.

## 2022-09-12 LAB — PROLACTIN: Prolactin: 22.3 ng/mL — ABNORMAL HIGH (ref 2.0–18.0)

## 2022-09-15 LAB — CORTISOL, URINE, 24 HOUR
24 Hour urine volume (VMAHVA): 2400 mL
CREATININE, URINE: 2.56 g/(24.h) — ABNORMAL HIGH (ref 0.50–2.15)
Cortisol (Ur), Free: 43.1 mcg/24 h (ref 4.0–50.0)

## 2022-09-19 LAB — TESTOSTERONE FREE MS/DIALYSIS
Free Testosterone, Serum: 36 pg/mL — ABNORMAL LOW
Testosterone, Serum (Total): 115 ng/dL — ABNORMAL LOW
Testosterone-% Free: 3.1 %

## 2022-09-24 ENCOUNTER — Ambulatory Visit (INDEPENDENT_AMBULATORY_CARE_PROVIDER_SITE_OTHER): Payer: 59 | Admitting: Licensed Clinical Social Worker

## 2022-09-24 ENCOUNTER — Ambulatory Visit: Payer: 59 | Admitting: Psychology

## 2022-09-24 DIAGNOSIS — F200 Paranoid schizophrenia: Secondary | ICD-10-CM | POA: Diagnosis not present

## 2022-09-24 NOTE — Progress Notes (Signed)
Virtual Visit via Video Note  I connected with Matthew Padilla on 09/24/22 at  1:00 PM EST by a video enabled telemedicine application and verified that I am speaking with the correct person using two identifiers.  Location: Patient: home Provider: remote office [place], [place])   I discussed the limitations of evaluation and management by telemedicine and the availability of in person appointments. The patient expressed understanding and agreed to proceed.  I discussed the assessment and treatment plan with the patient. The patient was provided an opportunity to ask questions and all were answered. The patient agreed with the plan and demonstrated an understanding of the instructions.   The patient was advised to call back or seek an in-person evaluation if the symptoms worsen or if the condition fails to improve as anticipated.  I provided 20 minutes of non-face-to-face time during this encounter.   Kairah Leoni R Nanea Jared, LCSW   THERAPIST PROGRESS NOTE  Session Time: 1-120p  Participation Level: Active  Behavioral Response: Neat and Well GroomedAlertAnxious  Type of Therapy: Individual Therapy  Treatment Goals addressed:   Learn and implement coping skills that result in a reduction of anxiety and worry, and improve daily functioning per pt report 3 out of 5 sessions documented  ProgressTowards Goals: Progressing  Interventions: Solution Focused and Supportive  Summary: Matthew Padilla is a 26 y.o. male who presents with improving symptoms related to anxiety and schizophrenia diagnoses. Pt reports that he is compliant with his medication and is managing anxiety symptoms well.Patient reports that he has recently achieved one of his personal goals, which was to register for classes in the spring. 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 Reviewed importance of time management, daily structure, and organizational skills.  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 Patient is worried that there could be some things going on with his pituitary gland or hyperthyroidism. Discussed the anticipation of awaiting medical assessment and evaluation, and how this often will trigger anxiety. Encourage patient to not research things online and to trust his medical team.  Patient reports that his mother and father our good support system, and he is looking forward to spending good quality time with family members around the holidays.  Continued recommendations are as follows: self care behaviors, positive social engagements, focusing on overall work/home/life balance, and focusing on positive physical and emotional wellness.   Suicidal/Homicidal: No  Therapist Response: Pt is continuing to apply interventions learned in session into daily life situations. Pt is currently on track to meet goals utilizing interventions mentioned above. Personal growth and progress noted. Treatment to continue as indicated.   Plan: Return again in 4 weeks.  Diagnosis: Schizophrenia, paranoid (HCC)  Collaboration of Care: Other pt encouraged to continue care with psychiatrist of record, Dr. Kathryne Sharper   Patient/Guardian was advised Release of Information must be obtained prior to any record release in order to collaborate their care with an outside provider. Patient/Guardian was advised if they have not already done so to contact the registration department to sign all necessary forms in order for [place] to release information regarding their care.   Consent: Patient/Guardian gives verbal consent for treatment and assignment of benefits for services provided during this visit. Patient/Guardian expressed understanding and agreed to proceed.   Ernest Haber Omar Gayden, LCSW 09/24/2022

## 2022-09-29 ENCOUNTER — Other Ambulatory Visit (HOSPITAL_COMMUNITY): Payer: Self-pay | Admitting: Psychiatry

## 2022-09-30 NOTE — Telephone Encounter (Signed)
Patient with appointment upcoming on 10/07/22. 30 day supply provided.

## 2022-10-02 IMAGING — CT CT HEAD W/O CM
3 series · 15 of 47 positions shown, 18 images · non-contrast
Comparison: None.

CLINICAL DATA: Delirium, new onset psychosis

EXAM:
CT HEAD WITHOUT CONTRAST
TECHNIQUE: Contiguous axial images were obtained from the base of the skull
through the vertex without intravenous contrast.

[Series 3: head wo · axial · 0.47mm/px · z∈[-46,+104]mm · 9 of 36 slices shown, 12 images]
[im 3/36  brain]
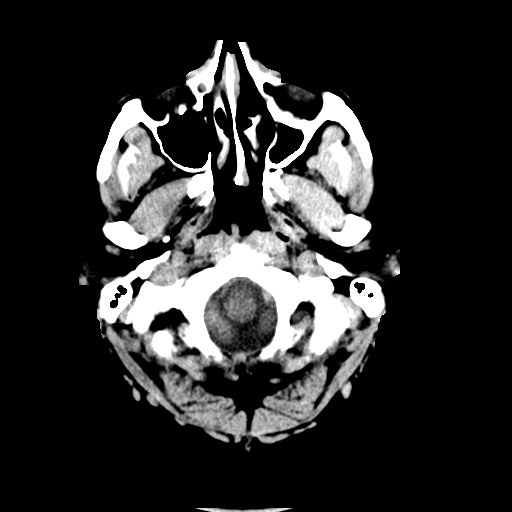
[im 3/36  bone]
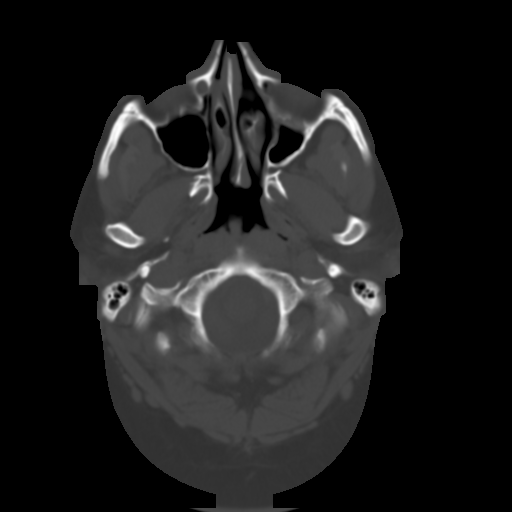
[im 7/36  brain]
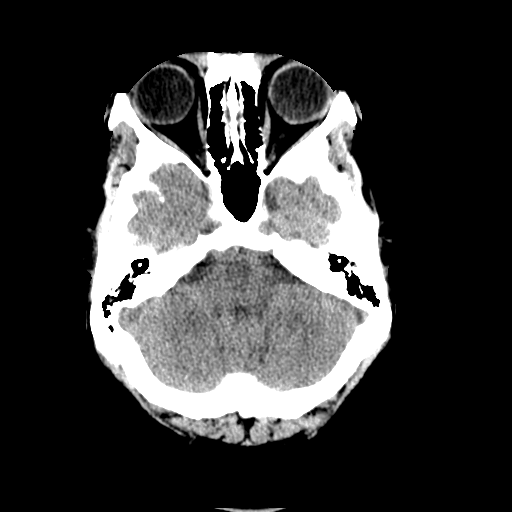
[im 10/36  brain]
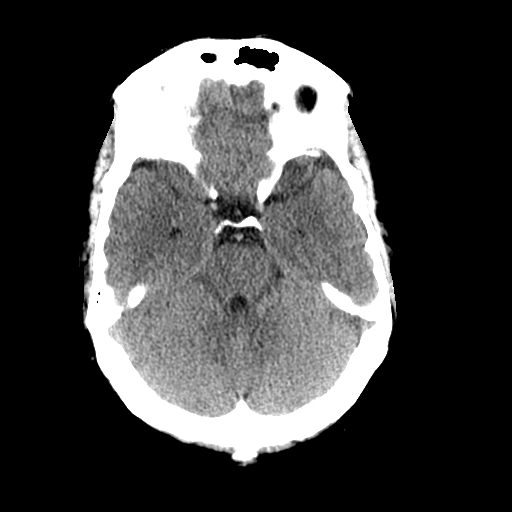
[im 14/36  brain]
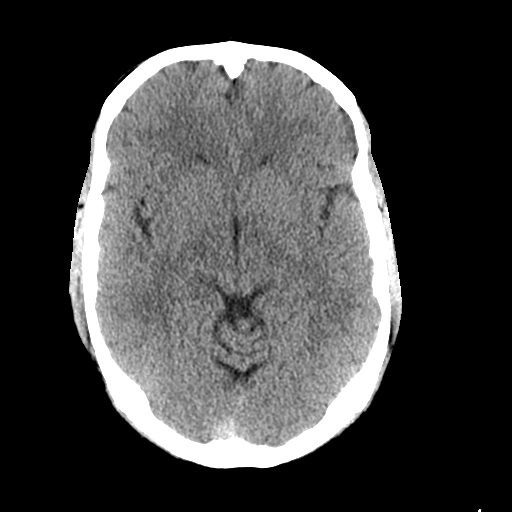
[im 19/36  brain]
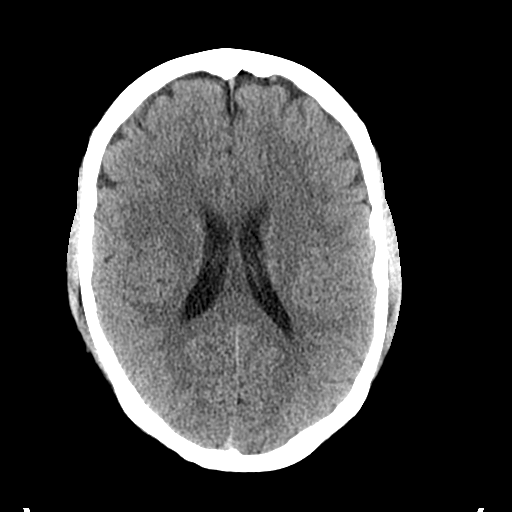
[im 19/36  bone]
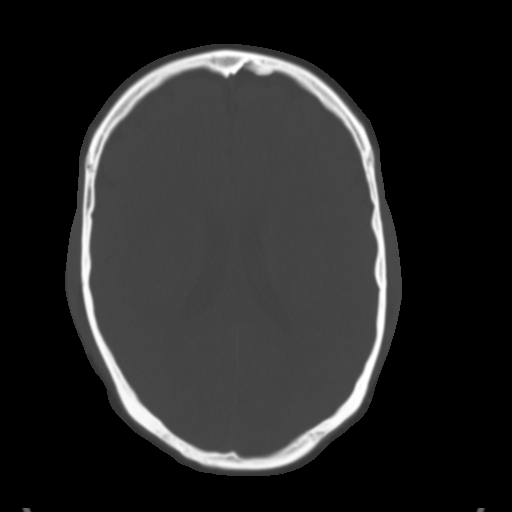
[im 22/36  brain]
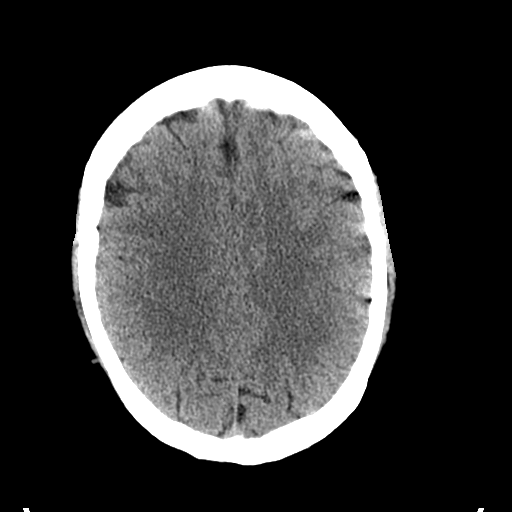
[im 26/36  brain]
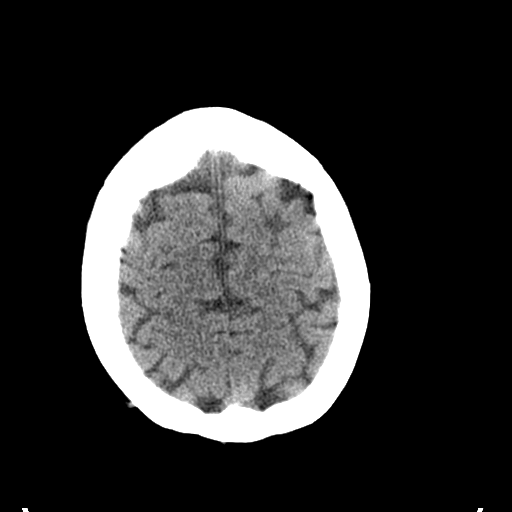
[im 29/36  brain]
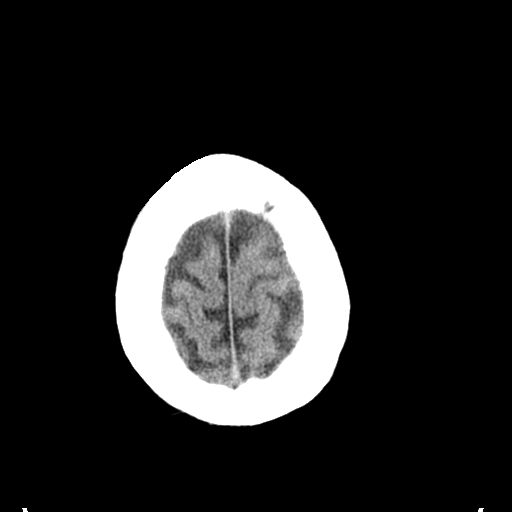
[im 33/36  brain]
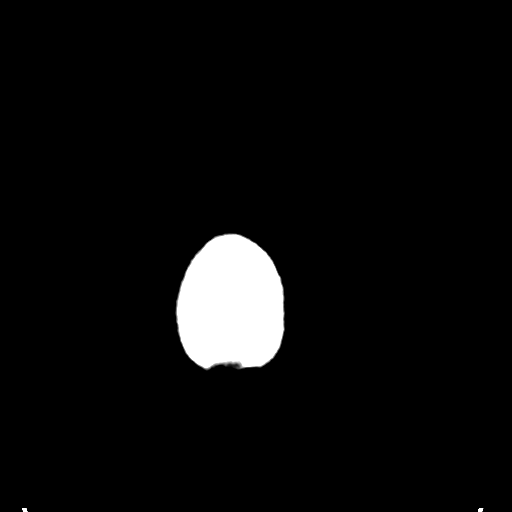
[im 33/36  bone]
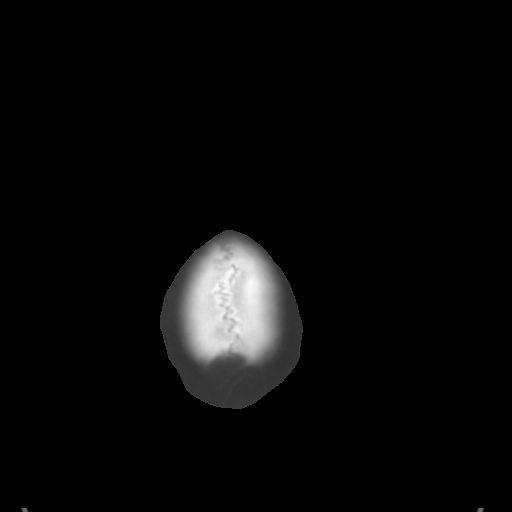

[Series 4: coronal soft tissue · coronal · 0.37mm/px · 3 of 84 slices shown]
[im 28/84  brain]
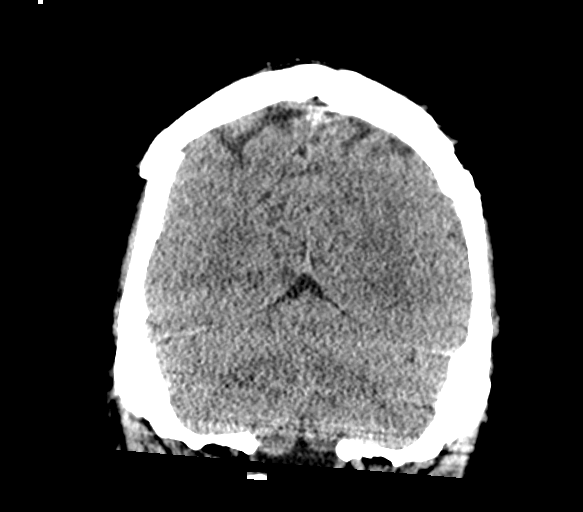
[im 37/84  brain]
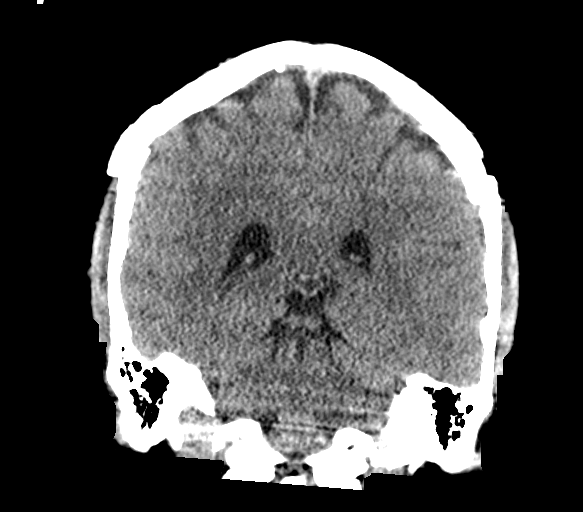
[im 47/84  brain]
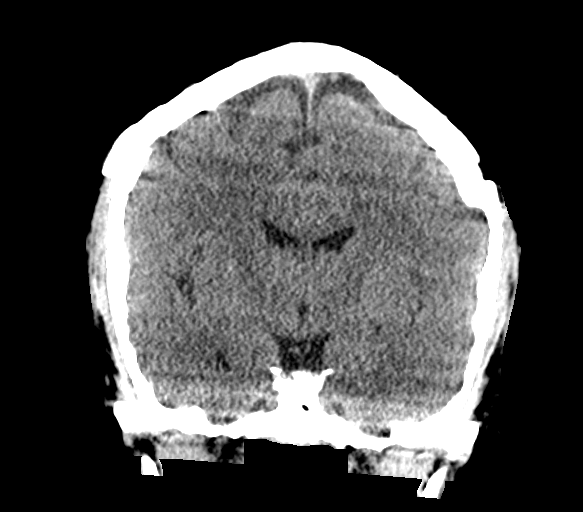

[Series 5: sagittal soft tissue · sagittal · 0.36mm/px · 3 of 73 slices shown]
[im 25/73  brain]
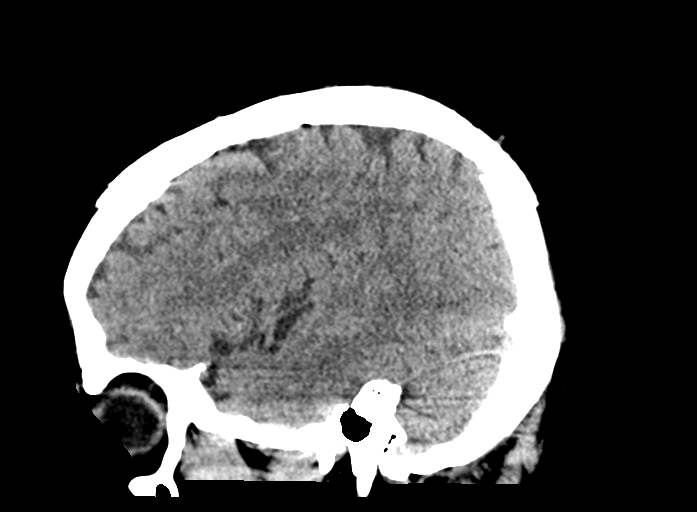
[im 37/73  brain]
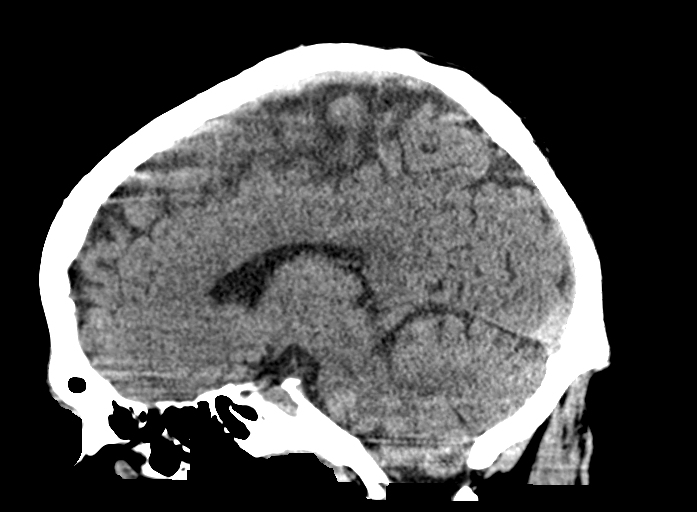
[im 49/73  brain]
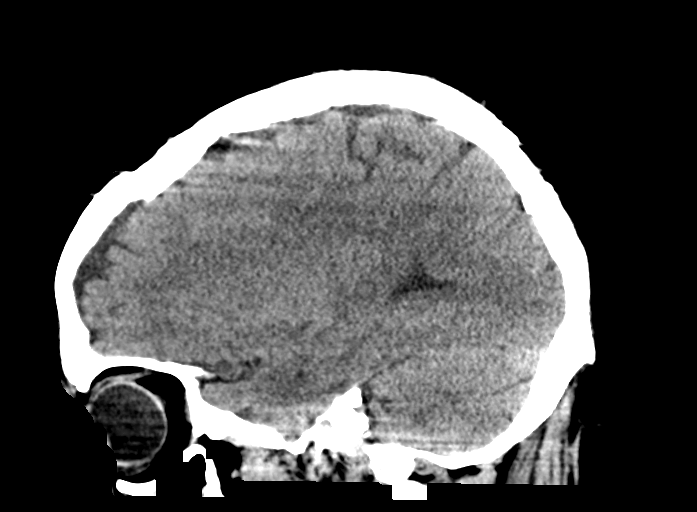

[15 of 47 positions shown; findings below may reference images not displayed]

FINDINGS: Brain: No evidence of acute infarction, hemorrhage, hydrocephalus,
extra-axial collection or mass lesion/mass effect.

Vascular: No hyperdense vessel or unexpected calcification.

Skull: Normal. Negative for fracture or focal lesion.

Sinuses/Orbits: Mucosal thickening in the left maxillary sinus.
Orbits are unremarkable.

Other: The mastoids are well aerated.
IMPRESSION: No acute intracranial process.

## 2022-10-07 ENCOUNTER — Telehealth (HOSPITAL_BASED_OUTPATIENT_CLINIC_OR_DEPARTMENT_OTHER): Payer: 59 | Admitting: Psychiatry

## 2022-10-07 ENCOUNTER — Encounter (HOSPITAL_COMMUNITY): Payer: Self-pay | Admitting: Psychiatry

## 2022-10-07 VITALS — Wt 385.0 lb

## 2022-10-07 DIAGNOSIS — F200 Paranoid schizophrenia: Secondary | ICD-10-CM | POA: Diagnosis not present

## 2022-10-07 DIAGNOSIS — F419 Anxiety disorder, unspecified: Secondary | ICD-10-CM | POA: Diagnosis not present

## 2022-10-07 MED ORDER — RISPERIDONE 1 MG PO TABS: ORAL_TABLET | ORAL | 2 refills | Status: DC

## 2022-10-07 MED ORDER — SERTRALINE HCL 50 MG PO TABS: ORAL_TABLET | ORAL | 2 refills | Status: DC

## 2022-10-07 NOTE — Progress Notes (Signed)
Virtual Visit via Video Note  I connected with Matthew Padilla on 10/07/22 at  2:20 PM EST by a video enabled telemedicine application and verified that I am speaking with the correct person using two identifiers.  Location: Patient: Home Provider: Home Office   I discussed the limitations of evaluation and management by telemedicine and the availability of in person appointments. The patient expressed understanding and agreed to proceed.  History of Present Illness: Patient is evaluated by video session for his follow-up appointment.  He is not taking Risperdal 1 mg as olanzapine causing weight gain and he was feeling tired.  He actually doing very well on the Risperdal.  He is more active and he had lost more than 10 pounds since switched to Risperdal.  He denies any panic attack or any crying spells.  He is in therapy with Christina.  Excited about going to start school on January 8 at [employer].  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  Denies any hallucination, paranoia or any suicidal thoughts.  He reported his paranoia is much stable and he has no longer any hallucination.  However he is still feels anxious around people but try to go to walk every day and go to grocery store with her mother at least 2 times a week.  He has no tremors, shakes or any EPS.  Recently had a blood work.  His liver enzymes are improved.  He wants to keep the Risperdal and Zoloft.  Past Psychiatric History: H/O schizophrenia.  Admitted in 2019 at [facility] after having suicidal thoughts to crash his car but no intent.  D/C on REXULTI and Wellbutrin but stopped after 4 weeks as no changed. H/O inpatient in October 2022 at [facility] for bizarre behavior and psychosis.  Given Haldol but stopped due to excessive drooling and sedation.  Olanzapine worked but weight gain   Recent Results (from the past 2160 hour(s))  Cortisol,free,24 hour urine w/creatinine     Status: Abnormal    Collection Time: 09/10/22 10:30 AM  Result Value Ref Range   24 Hour urine volume (VMAHVA) 2,400 mL   Cortisol (Ur), Free 43.1 4.0 - 50.0 mcg/24 h    Comment: . This test was developed and its analytical performance characteristics have been determined by [facility]. It has not been cleared or approved by FDA. This assay has been validated pursuant to the CLIA regulations and is used for clinical purposes.    CREATININE, URINE 2.56 (H) 0.50 - 2.15 g/24 h  Testosterone Free MS/Dialysis     Status: Abnormal   Collection Time: 09/11/22  7:54 AM  Result Value Ref Range   Testosterone, Serum (Total) 115 (L) ng/dL    Comment: This test was developed and its performance characteristics determined by [facility]. It has not been cleared or approved by the Food and Drug Administration. Reference Range: Adult Males >18 years    92 - 916 This [facility] LC/MS-MS method is currently certified by the [facility] Hormone Standardization Program (HoST).  Adult male reference interval is based on a population of healthy nonobese males (BMI <30) between 76 and 8 years old. Mardee Postin 0539,767;3419-3790 PMID: [id number].    Testosterone-% Free 3.1 %    Comment: This test was developed and its performance characteristics determined by [facility]. It has not been cleared or approved by the Food and Drug Administration. Reference Range: Adult Males: 1.5 - 3.2    Free Testosterone, Serum 36 (L) pg/mL  Comment: Reference Range: Adult Males: 52 - 280   Prolactin     Status: Abnormal   Collection Time: 09/11/22  7:54 AM  Result Value Ref Range   Prolactin 22.3 (H) 2.0 - 18.0 ng/mL  Luteinizing hormone     Status: None   Collection Time: 09/11/22  7:54 AM  Result Value Ref Range   LH 2.57 1.50 - 9.30 mIU/mL    Comment: Male Reference Range:20-70 yrs     1.5-9.3 mIU/mL>70 yrs       3.1-35.6 mIU/mLFemale Reference Range:Follicular Phase     0.2-72.5 mIU/mLMidcycle             8.7-76.3  mIU/mLLuteal Phase         0.5-16.9 mIU/mL  Post Menopausal      15.9-54.0  mIU/mLPregnant             <1.5 mIU/mLContraceptives       0.7-5.6 mIU/mL   T4, free     Status: None   Collection Time: 09/11/22  7:54 AM  Result Value Ref Range   Free T4 1.16 0.60 - 1.60 ng/dL    Comment: Specimens from patients who are undergoing biotin therapy and /or ingesting biotin supplements may contain high levels of biotin.  The higher biotin concentration in these specimens interferes with this Free T4 assay.  Specimens that contain high levels  of biotin may cause false high results for this Free T4 assay.  Please interpret results in light of the total clinical presentation of the patient.    TSH     Status: None   Collection Time: 09/11/22  7:54 AM  Result Value Ref Range   TSH 1.98 0.35 - 5.50 uIU/mL  Comprehensive metabolic panel     Status: None   Collection Time: 09/11/22  7:54 AM  Result Value Ref Range   Sodium 140 135 - 145 mEq/L   Potassium 4.0 3.5 - 5.1 mEq/L   Chloride 106 96 - 112 mEq/L   CO2 26 19 - 32 mEq/L   Glucose, Bld 94 70 - 99 mg/dL   BUN 14 6 - 23 mg/dL   Creatinine, Ser 0.86 0.40 - 1.50 mg/dL   Total Bilirubin 0.9 0.2 - 1.2 mg/dL   Alkaline Phosphatase 61 39 - 117 U/L   AST 22 0 - 37 U/L   ALT 34 0 - 53 U/L   Total Protein 7.6 6.0 - 8.3 g/dL   Albumin 4.6 3.5 - 5.2 g/dL   GFR 120.60 >60.00 mL/min    Comment: Calculated using the CKD-EPI Creatinine Equation (2021)   Calcium 9.9 8.4 - 10.5 mg/dL      Psychiatric Specialty Exam: Physical Exam  Review of Systems  Weight (!) 385 lb (174.6 kg).There is no height or weight on file to calculate BMI.  General Appearance: Casual  Eye Contact:  Good  Speech:  Slow  Volume:  Normal  Mood:  Euthymic  Affect:  Congruent  Thought Process:  Goal Directed  Orientation:  Full (Time, Place, and Person)  Thought Content:  Logical  Suicidal Thoughts:  No  Homicidal Thoughts:  No  Memory:  Immediate;   Good Recent;    Fair Remote;   Fair  Judgement:  Intact  Insight:  Present  Psychomotor Activity:  Normal  Concentration:  Concentration: Good and Attention Span: Good  Recall:  Good  Fund of Knowledge:  Good  Language:  Good  Akathisia:  No  Handed:  Right  AIMS (if indicated):  Assets:  Communication Skills Desire for Improvement Housing Social Support  ADL's:  Intact  Cognition:  WNL  Sleep:   ok      Assessment and Plan: Schizophrenia chronic paranoid type.  Anxiety.  Patient doing better on Risperdal 1 mg at bedtime.  I reviewed blood work results.  His liver enzymes are improved.  He lost weight and he feels more active.  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  Continue Zoloft 50 mg daily and Risperdal 1 mg at bedtime.  He is no longer taking olanzapine.  Recommended to call us back if is any question or any concern.  Follow-up in 3 months.  Follow Up Instructions:    I discussed the assessment and treatment plan with the patient. The patient was provided an opportunity to ask questions and all were answered. The patient agreed with the plan and demonstrated an understanding of the instructions.   The patient was advised to call back or seek an in-person evaluation if the symptoms worsen or if the condition fails to improve as anticipated.  Collaboration of Care: Other provider involved in patient's care AEB notes are available in epic to review.  Patient/Guardian was advised Release of Information must be obtained prior to any record release in order to collaborate their care with an outside provider. Patient/Guardian was advised if they have not already done so to contact the registration department to sign all necessary forms in order for [place] to release information regarding their care.   Consent: Patient/Guardian gives verbal consent for treatment and assignment of benefits for services provided during this visit.  Patient/Guardian expressed understanding and agreed to proceed.    I provided 21 minutes of non-face-to-face time during this encounter.   Kathlee Nations, MD

## 2022-10-22 ENCOUNTER — Ambulatory Visit
Admission: RE | Admit: 2022-10-22 | Discharge: 2022-10-22 | Disposition: A | Discharge status: Home / Self Care | Payer: 59 | Source: Ambulatory Visit | Attending: Internal Medicine | Admitting: Internal Medicine

## 2022-10-22 DIAGNOSIS — R7989 Other specified abnormal findings of blood chemistry: Secondary | ICD-10-CM

## 2022-10-26 ENCOUNTER — Telehealth: Payer: Self-pay | Admitting: Internal Medicine

## 2022-10-26 DIAGNOSIS — R7989 Other specified abnormal findings of blood chemistry: Secondary | ICD-10-CM

## 2022-10-26 MED ORDER — CLOMIPHENE CITRATE 50 MG PO TABS: 50.0000 mg | ORAL_TABLET | Freq: Every day | ORAL | 6 refills | Status: DC

## 2022-10-30 NOTE — Telephone Encounter (Signed)
error 

## 2022-11-03 ENCOUNTER — Ambulatory Visit (INDEPENDENT_AMBULATORY_CARE_PROVIDER_SITE_OTHER): Payer: 59 | Admitting: Licensed Clinical Social Worker

## 2022-11-03 DIAGNOSIS — F419 Anxiety disorder, unspecified: Secondary | ICD-10-CM

## 2022-11-03 DIAGNOSIS — F200 Paranoid schizophrenia: Secondary | ICD-10-CM

## 2022-11-03 NOTE — Progress Notes (Signed)
Virtual Visit via Video Note  I connected with Matthew Padilla on 11/03/22 at  9:00 AM EST by a video enabled telemedicine application and verified that I am speaking with the correct person using two identifiers.  Location: Patient: home Provider: remote office [place], [place])   I discussed the limitations of evaluation and management by telemedicine and the availability of in person appointments. The patient expressed understanding and agreed to proceed.  I discussed the assessment and treatment plan with the patient. The patient was provided an opportunity to ask questions and all were answered. The patient agreed with the plan and demonstrated an understanding of the instructions.   The patient was advised to call back or seek an in-person evaluation if the symptoms worsen or if the condition fails to improve as anticipated.  I provided 20 minutes of non-face-to-face time during this encounter.   [place], LCSW   THERAPIST PROGRESS NOTE  Session Time: 9-920a  Participation Level: Active  Behavioral Response: Neat and Well GroomedAlertAnxious  Type of Therapy: Individual Therapy  Treatment Goals addressed:   Learn and implement coping skills that result in a reduction of anxiety and worry, and improve daily functioning per pt report 3 out of 5 sessions documented  ProgressTowards Goals: Progressing  Interventions: Solution Focused and Supportive  Summary: Matthew Padilla is a 27 y.o. male who presents with improving symptoms related to anxiety and schizophrenia diagnoses. Pt reports that his overall mood has been stable and that pt is managing stress and anxiety well. Pt reports that he is compliant with current medications.   Allowed pt to explore and express thoughts and feelings associated with recent life situations and external stressors.Pt reports that he recently started taking classes--9 credit hours. Reviewed time management strategies, limiting  distractions while doing academic work, and organizing projects.   Pt reports that he spent quality time w/ sister over holidays and had good time with family members. Pt reports positive relationships with family members currently.  Pt did not report any specific incidents or concerns to discuss at today's session. Pt reports that he is getting good quality and quantity of sleep, appetite WNL, and is getting out of the house and engaging socially with others   Reviewed stress management and anxiety management strategies.   Continued recommendations are as follows: self care behaviors, positive social engagements, focusing on overall work/home/life balance, and focusing on positive physical and emotional wellness.   Suicidal/Homicidal: No  Therapist Response: Pt is continuing to apply interventions learned in session into daily life situations. Pt is currently on track to meet goals utilizing interventions mentioned above. Personal growth and progress noted. Treatment to continue as indicated.   Plan: Return again in 4 weeks.  Diagnosis:  Encounter Diagnoses  Name Primary?   Schizophrenia, paranoid ([place]) Yes   Anxiety    Collaboration of Care: Other pt encouraged to continue care with psychiatrist of record, Dr. Berniece Andreas   Patient/Guardian was advised Release of Information must be obtained prior to any record release in order to collaborate their care with an outside provider. Patient/Guardian was advised if they have not already done so to contact the registration department to sign all necessary forms in order for [place] to release information regarding their care.   Consent: Patient/Guardian gives verbal consent for treatment and assignment of benefits for services provided during this visit. Patient/Guardian expressed understanding and agreed to proceed.   [place], LCSW 11/03/2022

## 2022-12-29 ENCOUNTER — Ambulatory Visit (HOSPITAL_COMMUNITY): Payer: 59 | Admitting: Licensed Clinical Social Worker

## 2023-01-05 ENCOUNTER — Ambulatory Visit (INDEPENDENT_AMBULATORY_CARE_PROVIDER_SITE_OTHER): Payer: 59 | Admitting: Licensed Clinical Social Worker

## 2023-01-05 DIAGNOSIS — F419 Anxiety disorder, unspecified: Secondary | ICD-10-CM | POA: Diagnosis not present

## 2023-01-05 DIAGNOSIS — F200 Paranoid schizophrenia: Secondary | ICD-10-CM

## 2023-01-05 NOTE — Progress Notes (Signed)
Virtual Visit via Video Note  I connected with Yavin Lukowski on 01/05/23 at  9:00 AM EDT by a video enabled telemedicine application and verified that I am speaking with the correct person using two identifiers.  Location: Patient: home Provider: remote office [place], [place])   I discussed the limitations of evaluation and management by telemedicine and the availability of in person appointments. The patient expressed understanding and agreed to proceed.  I discussed the assessment and treatment plan with the patient. The patient was provided an opportunity to ask questions and all were answered. The patient agreed with the plan and demonstrated an understanding of the instructions.   The patient was advised to call back or seek an in-person evaluation if the symptoms worsen or if the condition fails to improve as anticipated.  I provided 20 minutes of non-face-to-face time during this encounter.   [place], LCSW   THERAPIST PROGRESS NOTE  Session Time: 9-920a  Participation Level: Active  Behavioral Response: Neat and Well GroomedAlertAnxious  Type of Therapy: Individual Therapy  Treatment Goals addressed:   Learn and implement coping skills that result in a reduction of anxiety and worry, and improve daily functioning per pt report 3 out of 5 sessions documented  ProgressTowards Goals: Progressing  Interventions: Solution Focused and Supportive  Summary: Mehki Cefaratti is a 27 y.o. male who presents with improving symptoms related to anxiety and schizophrenia diagnoses.  Allowed pt to explore and express thoughts and feelings associated with recent life situations and external stressors.  Patient reports a recent incident involving himself and his younger sister.  Patient reports that he was in the car with his sister, and she made a statement that triggered patient, and it escalated to the point of then yelling at one another.  Patient states that when he came  home he did discussed the incident with his mom, and allowed his mom to be the mediator between himself and his sister.  Patient states this situation was not resolved, but they are both taking time outs and giving each other personal space.  Patient states that he is not sure why he got so angry, but he feels that his sister was being demeaning to him, and calling him stupid after he was telling her to shut up.  Allow patient to identify ways in which our communication may trigger other people.  Patient states this has been 4-5 incidents with his younger sister this month.  Patient states that he is doing well with school this semester, and feels that he is managing his time well.  Patient is working hard to structure his day and be organized with due dates and assignments.  Patient states that he filled a personal goal recently-he got his driver's license.  Patient states that he can now drive with no restrictions, and he feels that his stress levels have decreased because he can get himself back and forth to school.  Reviewed coping skills for managing stress, managing anger, and managing anxiety.  Reviewed ways that patient helps himself if he feels down and depressed.  Continued recommendations are as follows: self care behaviors, positive social engagements, focusing on overall work/home/life balance, and focusing on positive physical and emotional wellness.   Suicidal/Homicidal: No  Therapist Response: Pt is continuing to apply interventions learned in session into daily life situations. Pt is currently on track to meet goals utilizing interventions mentioned above. Personal growth and progress noted. Treatment to continue as indicated.   Patient is demonstrating continued  development and capacity of improvement in family and relational functioning.  Developments continue in the areas of academic achievement and improvement of utilizing coping skills in the moment.    Plan: Return again in 4  weeks.  Diagnosis:  Encounter Diagnoses  Name Primary?   Schizophrenia, paranoid Yes   Anxiety    Collaboration of Care: Other pt encouraged to continue care with psychiatrist of record, Dr. Berniece Andreas   Patient/Guardian was advised Release of Information must be obtained prior to any record release in order to collaborate their care with an outside provider. Patient/Guardian was advised if they have not already done so to contact the registration department to sign all necessary forms in order for [place] to release information regarding their care.   Consent: Patient/Guardian gives verbal consent for treatment and assignment of benefits for services provided during this visit. Patient/Guardian expressed understanding and agreed to proceed.   [place], LCSW 01/05/2023

## 2023-01-06 ENCOUNTER — Telehealth (HOSPITAL_BASED_OUTPATIENT_CLINIC_OR_DEPARTMENT_OTHER): Payer: 59 | Admitting: Psychiatry

## 2023-01-06 ENCOUNTER — Encounter (HOSPITAL_COMMUNITY): Payer: Self-pay | Admitting: Psychiatry

## 2023-01-06 VITALS — Wt 380.0 lb

## 2023-01-06 DIAGNOSIS — F419 Anxiety disorder, unspecified: Secondary | ICD-10-CM | POA: Diagnosis not present

## 2023-01-06 DIAGNOSIS — F200 Paranoid schizophrenia: Secondary | ICD-10-CM

## 2023-01-06 MED ORDER — SERTRALINE HCL 50 MG PO TABS: ORAL_TABLET | ORAL | 2 refills | Status: DC

## 2023-01-06 MED ORDER — RISPERIDONE 1 MG PO TABS: ORAL_TABLET | ORAL | 2 refills | Status: DC

## 2023-01-06 NOTE — Progress Notes (Signed)
[facility] MD Virtual Progress Note   Patient Location: Home Provider Location: Office  I connect with patient by video and verified that I am speaking with correct person by using two identifiers. I discussed the limitations of evaluation and management by telemedicine and the availability of in person appointments. I also discussed with the patient that there may be a patient responsible charge related to this service. The patient expressed understanding and agreed to proceed.  Matthew Padilla [id number] 26 y.o.  01/06/2023 2:45 PM  History of Present Illness:  Patient is evaluated by video session.  He started school very well.  He is very happy and excited since started the [employer].  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  Denies any paranoia or hallucination.  He is getting along on him.  He is very happy sister came is still weak and.  Patient told he did well.  He is sleeping good.  He denies any mania or thoughts.  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  He has no tremors, shakes or any EPS.  He likes to keep the Risperdal and Zoloft.  Does not feel as anxious more frequently to grocery stores and public places very anxious.  He feels more active and denies drinking or using any illegal substances.    Past Psychiatric History: H/O schizophrenia.  Admitted in 2019 at [facility] after having suicidal thoughts to crash his car but no intent.  D/C on REXULTI and Wellbutrin but stopped after 4 weeks as no changed. H/O inpatient in October 2022 at [facility] for bizarre behavior and psychosis.  Given Haldol but stopped due to excessive drooling and sedation.  Olanzapine worked but weight gain.   Outpatient Encounter Medications as of 01/06/2023  Medication Sig   clomiPHENE (CLOMID) 50 MG tablet Take 1 tablet (50 mg total) by mouth daily.   risperiDONE  (RISPERDAL) 1 MG tablet TAKE 1 TABLET(1 MG) BY MOUTH AT BEDTIME   sertraline (ZOLOFT) 50 MG tablet TAKE 1 TABLET(50 MG) BY MOUTH DAILY   No facility-administered encounter medications on file as of 01/06/2023.    No results found for this or any previous visit (from the past 2160 hour(s)).   Psychiatric Specialty Exam: Physical Exam  Review of Systems  Weight (!) 380 lb (172.4 kg).There is no height or weight on file to calculate BMI.  General Appearance: Casual  Eye Contact:  Good  Speech:  Clear and Coherent and Slow  Volume:  Normal  Mood:  Euthymic  Affect:  Congruent  Thought Process:  Goal Directed  Orientation:  Full (Time, Place, and Person)  Thought Content:  Logical  Suicidal Thoughts:  No  Homicidal Thoughts:  No  Memory:  Immediate;   Good Recent;   Good Remote;   Good  Judgement:  Good  Insight:  Present  Psychomotor Activity:  Normal  Concentration:  Concentration: Good and Attention Span: Good  Recall:  Good  Fund of Knowledge:  Good  Language:  Good  Akathisia:  No  Handed:  Right  AIMS (if indicated):     Assets:  Communication Skills Desire for Improvement Housing Resilience Social Support Transportation  ADL's:  Intact  Cognition:  WNL  Sleep:  ok     Assessment/Plan: Schizophrenia, paranoid - Plan: risperiDONE (RISPERDAL) 1 MG tablet  Anxiety - Plan: risperiDONE (RISPERDAL) 1 MG tablet, sertraline (  ZOLOFT) 50 MG tablet  Patient is stable on his current medication.  He started school at [employer] and happy to start school.  He does not want to change the medication.  He is doing Careers information officer and hoping after he had transferred to [employer].  Continue Zoloft 50 mg daily and Risperdal 1 mg at bedtime.  Recommended to call us back if he has any questions.  Follow-up 3 months.     Follow Up Instructions:     I discussed the assessment and treatment plan with the patient. The patient was provided an opportunity to  ask questions and all were answered. The patient agreed with the plan and demonstrated an understanding of the instructions.   The patient was advised to call back or seek an in-person evaluation if the symptoms worsen or if the condition fails to improve as anticipated.    Collaboration of Care: Other provider involved in patient's care AEB notes are available in epic to review  Patient/Guardian was advised Release of Information must be obtained prior to any record release in order to collaborate their care with an outside provider. Patient/Guardian was advised if they have not already done so to contact the registration department to sign all necessary forms in order for [place] to release information regarding their care.   Consent: Patient/Guardian gives verbal consent for treatment and assignment of benefits for services provided during this visit. Patient/Guardian expressed understanding and agreed to proceed.     I provided 17 minutes of non face to face time during this encounter.  Kathlee Nations, MD 01/06/2023

## 2023-02-15 ENCOUNTER — Ambulatory Visit (INDEPENDENT_AMBULATORY_CARE_PROVIDER_SITE_OTHER): Payer: 59 | Admitting: Licensed Clinical Social Worker

## 2023-02-15 DIAGNOSIS — F200 Paranoid schizophrenia: Secondary | ICD-10-CM

## 2023-02-15 DIAGNOSIS — F419 Anxiety disorder, unspecified: Secondary | ICD-10-CM | POA: Diagnosis not present

## 2023-02-15 NOTE — Progress Notes (Signed)
Virtual Visit via Video Note  I connected with Matthew Padilla on 02/15/23 at 11:00 AM EDT by a video enabled telemedicine application and verified that I am speaking with the correct person using two identifiers.  Location: Patient: home Provider: remote office [place], [place])   I discussed the limitations of evaluation and management by telemedicine and the availability of in person appointments. The patient expressed understanding and agreed to proceed.  I discussed the assessment and treatment plan with the patient. The patient was provided an opportunity to ask questions and all were answered. The patient agreed with the plan and demonstrated an understanding of the instructions.   The patient was advised to call back or seek an in-person evaluation if the symptoms worsen or if the condition fails to improve as anticipated.  I provided 20 minutes of non-face-to-face time during this encounter.   Malissia Rabbani R Zakya Halabi, LCSW   THERAPIST PROGRESS NOTE  Session Time: 11-1120a  Participation Level: Active  Behavioral Response: Neat and Well GroomedAlertAnxious  Type of Therapy: Individual Therapy  Treatment Goals addressed:   Learn and implement coping skills that result in a reduction of anxiety and worry, and improve daily functioning per pt report 3 out of 5 sessions documented  ProgressTowards Goals: Progressing  Interventions: Solution Focused and Supportive  Summary: Matthew Padilla is a 27 y.o. male who presents with improving symptoms related to anxiety and schizophrenia diagnoses.Pt reports medication compliance and no issues/concerns with current medications. Pt is compliant with psychiatric appointments.   Pt reports that he is excited about new job at [employer] that he will be starting next week. Pt feels this will be a positive social outlet for him to develop peer relationships. Allowed pt safe space to explore thoughts and feelings--pt does not identify any areas of  distress at time of session.   Pt reports that he has not had any emotional outbursts since the incident prior to last session. Pt feels the relationship with his sister has improved a bit--they have not been fighting as much.  Pt states that he finished up his classes at [employer] this semester with two A's and one B. Pt reports that his grades validate the hard work he has put into his classes.   Pt reports no SI, HI, sleep quality/quantity good, and appetite WNL.  Pt does not report any challenges or areas he wishes to explore further at time of session. .   Continued recommendations are as follows: self care behaviors, positive social engagements, focusing on overall work/home/life balance, and focusing on positive physical and emotional wellness.   Suicidal/Homicidal: No  Therapist Response: Pt is continuing to apply interventions learned in session into daily life situations. Pt is currently on track to meet goals utilizing interventions mentioned above. Personal growth and progress noted. Treatment to continue as indicated.   Patient is demonstrating continued development and capacity of improvement in family and relational functioning.  Developments continue in the areas of academic achievement and improvement of utilizing coping skills in the moment.    Plan: Return again in 4 weeks.  Diagnosis:  Encounter Diagnoses  Name Primary?   Schizophrenia, paranoid (HCC) Yes   Anxiety     Collaboration of Care: Other pt encouraged to continue care with psychiatrist of record, Dr. Kathryne Sharper   Patient/Guardian was advised Release of Information must be obtained prior to any record release in order to collaborate their care with an outside provider. Patient/Guardian was advised if they have not already done so to  contact the registration department to sign all necessary forms in order for [place] to release information regarding their care.   Consent: Patient/Guardian gives verbal consent for  treatment and assignment of benefits for services provided during this visit. Patient/Guardian expressed understanding and agreed to proceed.   Ernest Haber Kameran Mcneese, LCSW 02/15/2023

## 2023-03-15 ENCOUNTER — Telehealth (HOSPITAL_COMMUNITY): Payer: Self-pay | Admitting: *Deleted

## 2023-03-15 NOTE — Telephone Encounter (Signed)
Received VM form pt's mother, [name] Lia, requesting a statement of disability for her insurance provider as pt is soon to age out of her coverage. They have filled out forms requested from [facility] however [facility] now requesting a statement, letter, from our office. Pt next appointment scheduled for 03/08/23. Please review and advise.

## 2023-03-15 NOTE — Telephone Encounter (Signed)
We can send the medical records to the insurance company.  However if they need a letter then he may need to have the form from insurance company describing what information they needed from [place].?

## 2023-03-15 NOTE — Telephone Encounter (Signed)
Will clarify with mother.

## 2023-03-16 ENCOUNTER — Telehealth (HOSPITAL_COMMUNITY): Payer: Self-pay | Admitting: *Deleted

## 2023-03-16 NOTE — Telephone Encounter (Signed)
Writer returned pt's mothers' call regarding pt aging off her insurance plan. Ms. Schnelle advised to please return this nurses' call providing more information as to what specifically insurance company needs from [place].

## 2023-03-25 ENCOUNTER — Telehealth (HOSPITAL_COMMUNITY): Payer: Self-pay | Admitting: *Deleted

## 2023-03-25 NOTE — Telephone Encounter (Signed)
LVM for mother, [name], to advise that Disabled Dependent Child certification has been completed and signed by Dr. Lolly Mustache. Writer asked mother to return call and advise how she needs this form to be sent (either to her and/or [facility]) as there are pages to be filled out by her as well.

## 2023-04-07 ENCOUNTER — Telehealth (HOSPITAL_BASED_OUTPATIENT_CLINIC_OR_DEPARTMENT_OTHER): Payer: 59 | Admitting: Psychiatry

## 2023-04-07 ENCOUNTER — Encounter (HOSPITAL_COMMUNITY): Payer: Self-pay | Admitting: Psychiatry

## 2023-04-07 VITALS — Wt 375.0 lb

## 2023-04-07 DIAGNOSIS — F419 Anxiety disorder, unspecified: Secondary | ICD-10-CM | POA: Diagnosis not present

## 2023-04-07 DIAGNOSIS — F200 Paranoid schizophrenia: Secondary | ICD-10-CM

## 2023-04-07 MED ORDER — SERTRALINE HCL 50 MG PO TABS: 25.0000 mg | ORAL_TABLET | Freq: Every day | ORAL | 2 refills | Status: DC

## 2023-04-07 MED ORDER — RISPERIDONE 1 MG PO TABS: ORAL_TABLET | ORAL | 2 refills | Status: DC

## 2023-04-07 NOTE — Progress Notes (Signed)
[facility] MD Virtual Progress Note   Patient Location: Home Provider Location: Home Office  I connect with patient by video and verified that I am speaking with correct person by using two identifiers. I discussed the limitations of evaluation and management by telemedicine and the availability of in person appointments. I also discussed with the patient that there may be a patient responsible charge related to this service. The patient expressed understanding and agreed to proceed.  Matthew Padilla [id number] 27 y.o.  04/07/2023 2:01 PM  History of Present Illness:  Patient is evaluated by video session.  He is doing well and taking his medication.  His school is going well and he did well in his classes.  He is hoping to finish his associate in 2 years and I plan to get admission in the [place].  He also started working at a [employer] for the past 6 weeks.  [place] is able to handle his job without any issue.  He works 15-30 hours a week depending on the needs.  He is sleeping good.  He denies any mania, psychosis, paranoia or any hallucination.  He has no tremors, shakes or any EPS.  He like to try coming off from medication and wondering if she can cut down the dose of Zoloft.  He feels things are going very well and he has no concerns about his symptoms.  He does not get upset, irritable and angry.  He is comfortable around people.  He denies any severe anxiety or panic attack.  He is more active.  Lives with his parents.  He has no tremor or shakes or any EPS.  He is taking risperidone and Zoloft 50 mg.  He has no suicidal thoughts.  Past Psychiatric History: H/O schizophrenia.  Admitted in 2019 at [facility] after having suicidal thoughts to crash his car but no intent.  D/C on REXULTI and Wellbutrin but stopped after 4 weeks as no changed. H/O inpatient in October 2022 at [facility] for bizarre behavior and psychosis.  Given Haldol but stopped due to  excessive drooling and sedation.  Olanzapine worked but weight gain.    Outpatient Encounter Medications as of 04/07/2023  Medication Sig   clomiPHENE (CLOMID) 50 MG tablet Take 1 tablet (50 mg total) by mouth daily.   risperiDONE (RISPERDAL) 1 MG tablet TAKE 1 TABLET(1 MG) BY MOUTH AT BEDTIME   sertraline (ZOLOFT) 50 MG tablet TAKE 1 TABLET(50 MG) BY MOUTH DAILY   No facility-administered encounter medications on file as of 04/07/2023.    No results found for this or any previous visit (from the past 2160 hour(s)).   Psychiatric Specialty Exam: Physical Exam  Review of Systems  Weight (!) 375 lb (170.1 kg).There is no height or weight on file to calculate BMI.  General Appearance: Casual  Eye Contact:  Fair  Speech:  Clear and Coherent and Slow  Volume:  Normal  Mood:  Euthymic  Affect:  Appropriate  Thought Process:  Goal Directed  Orientation:  Full (Time, Place, and Person)  Thought Content:  Logical  Suicidal Thoughts:  No  Homicidal Thoughts:  No  Memory:  Immediate;   Good Recent;   Good Remote;   Good  Judgement:  Intact  Insight:  Present  Psychomotor Activity:  Normal  Concentration:  Concentration: Good and Attention Span: Fair  Recall:  Good  Fund of Knowledge:  Good  Language:  Good  Akathisia:  No  Handed:  Right  AIMS (if indicated):     Assets:  Communication Skills Desire for Improvement Housing Social Support Transportation  ADL's:  Intact  Cognition:  WNL  Sleep:  good     Assessment/Plan: Schizophrenia, paranoid (HCC) - Plan: risperiDONE (RISPERDAL) 1 MG tablet  Anxiety - Plan: risperiDONE (RISPERDAL) 1 MG tablet, sertraline (ZOLOFT) 50 MG tablet  Patient doing very well on his current medication.  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  He feels he is moving forward in his life.  He is wondering if he can cut down and eventually stop the Zoloft.  We discuss  given the history of multiple hospitalization.  To be very careful coming off from the medication but willing to try Zoloft half dose to take only 25 mg.  However if he noticed symptoms are coming back then he can go back to 50 mg daily.  Patient agreed with the plan.  We will continue Risperdal 1 mg at bedtime.  Recommended to call us back with any question or any concern.  Follow-up in 3 months.   Follow Up Instructions:     I discussed the assessment and treatment plan with the patient. The patient was provided an opportunity to ask questions and all were answered. The patient agreed with the plan and demonstrated an understanding of the instructions.   The patient was advised to call back or seek an in-person evaluation if the symptoms worsen or if the condition fails to improve as anticipated.    Collaboration of Care: Other provider involved in patient's care AEB notes are available in epic to review.  Patient/Guardian was advised Release of Information must be obtained prior to any record release in order to collaborate their care with an outside provider. Patient/Guardian was advised if they have not already done so to contact the registration department to sign all necessary forms in order for [place] to release information regarding their care.   Consent: Patient/Guardian gives verbal consent for treatment and assignment of benefits for services provided during this visit. Patient/Guardian expressed understanding and agreed to proceed.     I provided 20 minutes of non face to face time during this encounter.  Note: This document was prepared by [employer] voice dictation technology and any errors that results from this process are unintentional.    Cleotis Nipper, MD 04/07/2023

## 2023-04-12 ENCOUNTER — Encounter: Payer: Self-pay | Admitting: Internal Medicine

## 2023-04-14 ENCOUNTER — Encounter: Payer: Self-pay | Admitting: Internal Medicine

## 2023-04-14 ENCOUNTER — Ambulatory Visit: Payer: 59 | Admitting: Internal Medicine

## 2023-04-14 VITALS — BP 120/70 | HR 110 | Ht 75.0 in | Wt 379.0 lb

## 2023-04-14 DIAGNOSIS — E221 Hyperprolactinemia: Secondary | ICD-10-CM

## 2023-04-14 DIAGNOSIS — E291 Testicular hypofunction: Secondary | ICD-10-CM

## 2023-04-14 NOTE — Progress Notes (Signed)
Name: Matthew Padilla  MRN/ DOB: [id number], [birth year]    Age/ Sex: 27 y.o., male    PCP: Collene Mares, [place]   Reason for Endocrinology Evaluation: Hypogonadism      Date of Initial Endocrinology Evaluation: 01/14/2021    HPI: [name] is a 27 y.o. male with a past medical history of Depression . The patient presented for initial endocrinology clinic visit on 01/14/2021 for consultative assistance with his hypogonadism .    He presented to his PCP in 10/2020  For evaluation of severe depression, lack of motivation and mood changes. Mother asked for evaluation for klinefelter syndrome  Which prompted lab work showing normal TSH 1.98 uIU/mL , low total testosterone at 204 ng/dL ( reference  [id number])  but normal free testosterone 14.4 pg/dL ( reference 5.2-84.1) normal LH and FSH at 6.7 and 1.5 respectively.    Of note the pt was first noted with depression when he entered [place] [employer] at age 71 which resulted in expelling him due to the fact that he stopped going to classes. He was not treated for depression until a couple years later when he was admitted for in-patient facility, pt was non compliant with anti depressants after discharge.     Moved from [place] ~2021 No learning difficulties  He went through puberty at age 47     Has two sisters    Was lost to follow-up from 2022 until his return to our office December 2023   24-hour urinary cortisol was normal at 43.1 mcg, total testosterone low at 115 NG/DL, prolactin slightly elevated at 22.3 NG/mL, with inappropriately normal LH 2.57 mIU/mL and normal TFTs   MRI of the brain showed normal pituitary gland 10/2022  Patient started on clomiphene 10/2022  SUBJECTIVE:    Today (04/14/23):  Matthew Padilla is here for further evaluation of hypogonadism.   Patient continues to follow-up with psychiatry for paranoid schizophrenia He is accompanied by his mother today   Has noted improved energy   Improved in focus and clarity  Has been exercising by walking Working part time at [employer]  school at [employer]  Has noted rare spontaneous erections-which is an improvement from past Has noted improved facial Hair growth   Had recent constipation but no diarrhea  Denies headaches or changes in vision ( Hx of lasik)     Clomiphene 50 mg daily  HISTORY:  Past Medical History: No past medical history on file. Past Surgical History:   Social History:  reports that he has never smoked. He has never used smokeless tobacco. He reports that he does not currently use alcohol. Family History: family history is not on file.   HOME MEDICATIONS: Allergies as of 04/14/2023   No Known Allergies      Medication List        Accurate as of April 14, 2023 10:32 AM. If you have any questions, ask your nurse or doctor.          clomiPHENE 50 MG tablet Commonly known as: CLOMID Take 1 tablet (50 mg total) by mouth daily.   risperiDONE 1 MG tablet Commonly known as: RISPERDAL TAKE 1 TABLET(1 MG) BY MOUTH AT BEDTIME   sertraline 50 MG tablet Commonly known as: ZOLOFT Take 0.5-1 tablets (25-50 mg total) by mouth daily.          REVIEW OF SYSTEMS: A comprehensive ROS was conducted with the patient and is negative except as per HPI     OBJECTIVE:  VS: There were no vitals taken for this visit.    Wt Readings from Last 3 Encounters:  09/08/22 (!) 393 lb 6.4 oz (178.4 kg)  07/09/21 (!) 362 lb (164.2 kg)  01/14/21 (!) 362 lb 2 oz (164.3 kg)     EXAM: General: Pt appears well and is in NAD  Lungs: Clear with good BS bilat   Heart: Auscultation: RRR.  Abdomen:  soft, nontender  Extremities:  BL LE: No pretibial edema normal   Mental Status: Judgment, insight: Intact Orientation: Oriented to time, place, and person Mood and affect: No depression, anxiety, or agitation     DATA REVIEWED:     Latest Reference Range & Units 09/11/22 07:54  LH 1.50 - 9.30 mIU/mL  2.57  Prolactin 2.0 - 18.0 ng/mL 22.3 (H)  Glucose 70 - 99 mg/dL 94  Testosterone-% Free % 3.1  Testosterone, Serum (Total) ng/dL 952 (L)  Free Testosterone, Serum pg/mL 36 (L)  TSH 0.35 - 5.50 uIU/mL 1.98  T4,Free(Direct) 0.60 - 1.60 ng/dL 8.41  (H): Data is abnormally high (L): Data is abnormally low   MRI brain 10/22/2022   EXAM: MRI HEAD WITHOUT CONTRAST   TECHNIQUE: Multiplanar, multiecho pulse sequences of the brain and surrounding structures were obtained without intravenous contrast.   COMPARISON:  None Available.   FINDINGS: Brain: Normal size and shape of the pituitary gland. Normal appearance of the infundibulum, suprasellar cistern, and cavernous sinus region.   No infarct, hemorrhage, hydrocephalus, or collection. No white matter disease   Vascular: Major flow voids are preserved   Skull and upper cervical spine: Normal marrow signal   Sinuses/Orbits: Negative   IMPRESSION: Normal noncontrast MRI of the brain and pituitary gland.    ASSESSMENT/PLAN/RECOMMENDATIONS:   Low total serum testosterone:  -Patient has been on clomiphene since January 2024 and has noted clinical improvement -He would like to reduce the dose if that is an option for him in the future -Patient will return for fasting testosterone levels, CBC and CMP   2.  Hyperprolactinemia:  -This is most likely secondary to risperidone -No intervention -No symptoms -MRI was normal 10/2022   Follow-up in 6 months  Signed electronically by: Lyndle Herrlich, MD  [facility]  [facility] [address] [place] [place], [place] [address] Phone: [phone] FAX: [phone]   CC: Collene Mares, [place] [address] [place] [address] Phone: [phone] Fax: [phone]   Return to Endocrinology clinic as below: Future Appointments  Date Time Provider Department Center  04/14/2023  2:20 PM Savon Cobbs, Konrad Dolores, MD  [facility] None  07/07/2023  2:00 PM Arfeen, Phillips Grout, MD [facility] None

## 2023-04-16 ENCOUNTER — Telehealth (HOSPITAL_COMMUNITY): Payer: Self-pay | Admitting: *Deleted

## 2023-04-16 ENCOUNTER — Telehealth (HOSPITAL_COMMUNITY): Payer: Self-pay | Admitting: Psychiatry

## 2023-04-16 NOTE — Telephone Encounter (Signed)
Please do 30-minute appointment

## 2023-04-16 NOTE — Telephone Encounter (Signed)
Writer spoke with pt's mother, Matthew Padilla, who called to advise you that pt has been manic for the about the last week. Not sleeping and coming up with many ideas and projects that he researches. Possibly up cleaning the house in the middle of the night. Mother acknowledges that pt has/or had, done doing mushrooms and this seem to trigger behavior, however she says that every July and August are bad months for him. Matthew Padilla says she cannot mention recent bx as pt becomes very loud and verbally aggressive. Pt next appointment is scheduled for 05/18/23 but mother would like to know if they can get an earlier appointment please. Writer gave mother address and information about [facility] and advised this might be best bet as we are closing for the week and the will be closed this weekend. Matthew Padilla understanding and agrees. She would still like to see if pt may be seen earlier than scheduled appointment 05/18/23. Please review and advise.

## 2023-04-16 NOTE — Telephone Encounter (Signed)
He should go to [facility] and get evaluate. I am happy to see earlier if [facility] recommend sooner appointment.

## 2023-04-16 NOTE — Telephone Encounter (Signed)
Patient called requesting an appointment due to family issues. Patient stated things have gone "sour" with his family and he needs to have a tough conversation with Dr.Arfeen. Patient is scheduled for a virtual appointment on 8/13.

## 2023-04-18 ENCOUNTER — Emergency Department (EMERGENCY_DEPARTMENT_HOSPITAL)
Admission: EM | Admit: 2023-04-18 | Discharge: 2023-04-19 | Disposition: A | Discharge status: Home / Self Care | Payer: 59 | Source: Home / Self Care | Attending: Emergency Medicine | Admitting: Emergency Medicine

## 2023-04-18 ENCOUNTER — Other Ambulatory Visit: Payer: Self-pay

## 2023-04-18 ENCOUNTER — Encounter (HOSPITAL_COMMUNITY): Payer: Self-pay | Admitting: Emergency Medicine

## 2023-04-18 DIAGNOSIS — F200 Paranoid schizophrenia: Secondary | ICD-10-CM | POA: Insufficient documentation

## 2023-04-18 DIAGNOSIS — F430 Acute stress reaction: Secondary | ICD-10-CM | POA: Insufficient documentation

## 2023-04-18 DIAGNOSIS — R443 Hallucinations, unspecified: Secondary | ICD-10-CM | POA: Insufficient documentation

## 2023-04-18 DIAGNOSIS — R002 Palpitations: Secondary | ICD-10-CM | POA: Insufficient documentation

## 2023-04-18 DIAGNOSIS — M6282 Rhabdomyolysis: Secondary | ICD-10-CM | POA: Insufficient documentation

## 2023-04-18 DIAGNOSIS — R000 Tachycardia, unspecified: Secondary | ICD-10-CM

## 2023-04-18 DIAGNOSIS — F419 Anxiety disorder, unspecified: Secondary | ICD-10-CM | POA: Insufficient documentation

## 2023-04-18 DIAGNOSIS — F209 Schizophrenia, unspecified: Secondary | ICD-10-CM | POA: Diagnosis not present

## 2023-04-18 HISTORY — DX: Other specified abnormal findings of blood chemistry: R79.89

## 2023-04-18 HISTORY — DX: Paranoid schizophrenia: F20.0

## 2023-04-18 HISTORY — DX: Anxiety disorder, unspecified: F41.9

## 2023-04-18 LAB — COMPREHENSIVE METABOLIC PANEL
ALT: 44 U/L (ref 0–44)
AST: 51 U/L — ABNORMAL HIGH (ref 15–41)
Albumin: 3.8 g/dL (ref 3.5–5.0)
Alkaline Phosphatase: 47 U/L (ref 38–126)
Anion gap: 9 (ref 5–15)
BUN: 16 mg/dL (ref 6–20)
CO2: 23 mmol/L (ref 22–32)
Calcium: 8.8 mg/dL — ABNORMAL LOW (ref 8.9–10.3)
Chloride: 105 mmol/L (ref 98–111)
Creatinine, Ser: 0.92 mg/dL (ref 0.61–1.24)
GFR, Estimated: >60 mL/min (ref >60)
Glucose, Bld: 115 mg/dL — ABNORMAL HIGH (ref 70–99)
Potassium: 3.6 mmol/L (ref 3.5–5.1)
Sodium: 137 mmol/L (ref 135–145)
Total Bilirubin: 0.7 mg/dL (ref 0.3–1.2)
Total Protein: 6.6 g/dL (ref 6.5–8.1)

## 2023-04-18 LAB — CBC WITH DIFFERENTIAL/PLATELET
Abs Immature Granulocytes: 0.03 10*3/uL (ref 0.00–0.07)
Basophils Absolute: 0 10*3/uL (ref 0.0–0.1)
Basophils Relative: 1 %
Eosinophils Absolute: 0.1 10*3/uL (ref 0.0–0.5)
Eosinophils Relative: 1 %
HCT: 44.7 % (ref 39.0–52.0)
Hemoglobin: 13.9 g/dL (ref 13.0–17.0)
Immature Granulocytes: 0 %
Lymphocytes Relative: 39 %
Lymphs Abs: 3.3 10*3/uL (ref 0.7–4.0)
MCH: 24.2 pg — ABNORMAL LOW (ref 26.0–34.0)
MCHC: 31.1 g/dL (ref 30.0–36.0)
MCV: 77.9 fL — ABNORMAL LOW (ref 80.0–100.0)
Monocytes Absolute: 0.8 10*3/uL (ref 0.1–1.0)
Monocytes Relative: 9 %
Neutro Abs: 4.4 10*3/uL (ref 1.7–7.7)
Neutrophils Relative %: 50 %
Platelets: 242 10*3/uL (ref 150–400)
RBC: 5.74 MIL/uL (ref 4.22–5.81)
RDW: 17.2 % — ABNORMAL HIGH (ref 11.5–15.5)
WBC: 8.7 10*3/uL (ref 4.0–10.5)
nRBC: 0 % (ref 0.0–0.2)

## 2023-04-18 LAB — URINALYSIS, ROUTINE W REFLEX MICROSCOPIC
Bilirubin Urine: NEGATIVE
Glucose, UA: NEGATIVE mg/dL
Hgb urine dipstick: NEGATIVE
Ketones, ur: NEGATIVE mg/dL
Leukocytes,Ua: NEGATIVE
Nitrite: NEGATIVE
Protein, ur: NEGATIVE mg/dL
Specific Gravity, Urine: 1.005 (ref 1.005–1.030)
pH: 6 (ref 5.0–8.0)

## 2023-04-18 LAB — TROPONIN I (HIGH SENSITIVITY)
Troponin I (High Sensitivity): 19 ng/L — ABNORMAL HIGH (ref <18)
Troponin I (High Sensitivity): 18 ng/L — ABNORMAL HIGH (ref <18)

## 2023-04-18 LAB — RAPID URINE DRUG SCREEN, HOSP PERFORMED
Amphetamines: NOT DETECTED
Barbiturates: NOT DETECTED
Benzodiazepines: NOT DETECTED
Cocaine: NOT DETECTED
Opiates: NOT DETECTED
Tetrahydrocannabinol: POSITIVE — AB

## 2023-04-18 LAB — CK
Total CK: 1996 U/L — ABNORMAL HIGH (ref 49–397)
Total CK: 1183 U/L — ABNORMAL HIGH (ref 49–397)

## 2023-04-18 LAB — ETHANOL: Alcohol, Ethyl (B): <10 mg/dL (ref <10)

## 2023-04-18 MED ORDER — SODIUM CHLORIDE 0.9 % IV BOLUS
1000.0000 mL | Freq: Once | INTRAVENOUS | Status: AC
  Administered 2023-04-18: 1000 mL via INTRAVENOUS

## 2023-04-18 MED ORDER — LORAZEPAM 2 MG/ML IJ SOLN
2.0000 mg | Freq: Three times a day (TID) | INTRAMUSCULAR | Status: DC | PRN
  Administered 2023-04-18 – 2023-04-19 (×3): 2 mg via INTRAMUSCULAR
  Filled 2023-04-18 (×2): qty 1

## 2023-04-18 MED ORDER — ZIPRASIDONE MESYLATE 20 MG IM SOLR
20.0000 mg | Freq: Once | INTRAMUSCULAR | Status: AC
  Administered 2023-04-18: 20 mg via INTRAMUSCULAR
  Filled 2023-04-18: qty 20

## 2023-04-18 MED ORDER — LORAZEPAM 1 MG PO TABS
2.0000 mg | ORAL_TABLET | Freq: Three times a day (TID) | ORAL | Status: DC | PRN
  Administered 2023-04-18 – 2023-04-19 (×2): 2 mg via ORAL
  Filled 2023-04-18 (×2): qty 2

## 2023-04-18 MED ORDER — LORAZEPAM 2 MG/ML IJ SOLN
2.0000 mg | Freq: Once | INTRAMUSCULAR | Status: AC
  Administered 2023-04-18: 2 mg via INTRAVENOUS
  Filled 2023-04-18: qty 1

## 2023-04-18 NOTE — ED Notes (Signed)
[facility] paperwork given @6 :20am. In process.

## 2023-04-18 NOTE — Consult Note (Addendum)
BH ED ASSESSMENT   Reason for Consult:  Psych Consult  Referring Physician:  Darrick Grinder, PA-C  Patient Identification: Matthew Padilla MRN:  [id number] ED Chief Complaint: Schizophrenia, paranoid [facility])  Diagnosis:  Principal Problem:   Schizophrenia, paranoid [facility])   ED Assessment Time Calculation: Start Time: 0800 Stop Time: 0900 Total Time in Minutes (Assessment Completion): 60   Subjective:    Matthew Padilla is a 27 y.o. caucasian male with a pertinent long historical and current psychiatric diagnoses of paranoid schizophrenia and anxiety, with pertinent medical comorbidities that include obesity and hypogonadism, and additional past and pertinent psychiatric history of multiple hospitalizations for decompensation of the patient's mental health in the form of medication noncompliance and a recrudescence of severe psychotic and manic features, who presents this encounter by way of self to the emergency department for palpitations/tachycardia, increased anxiety, and increased stress, and was subsequently placed under involuntary commitment after being appreciated to be actively psychotic by emergency room staff with petition taken out by EDP team for danger to himself and others, and the subsequent placing of psychiatric consult.  Patient currently remains involuntary committed and is deemed to be largely medically stable though not officially cleared at this time medically, given mild rhabdomyolysis still appreciable.  Per IVC paperwork  "Patient appears to be manic, pacing throughout hospital hallways.  Patient randomly yelling at staff and to himself.  Patient with history of paranoid schizophrenia, anxiety"  HPI:    Patient seen at [facility] emergency department for face-to-face psychiatric evaluation.  Upon evaluation, patient presents significantly oddly related, with atypical interpersonal style, latent irritability, a paranoid edge, and RTIS.  Patient thought process is  meaningfully disorganized, but intermittently superficially linear and goal-directed, able to articulate that he knows that he is "not well" and "out of my mind", states that he is amenable to treatment and pharmacological intervention for his mental health.  Patient reports that he has not slept in nearly 7 days, reports that last time he slept was Tuesday of last week.  Patient reports that he stopped taking his Risperdal and his Zoloft since about March, states that he has been lying to everyone that he has been taking it, but that he did take a dose yesterday to, "appease my parents".  Patient endorses that currently he is not hearing or seeing things, but then begins intensely staring down the hallway at two physicians speaking to each other in collaboration, then turns to this writer stating that he is fearful and paranoid and that they are talking about him.  Patient endorses that he does not want to continue Zoloft, but he is amenable to other medications.  Patient endorses no suicidal or homicidal ideations, but vaguely expresses that when he has been in his current state he has wanted to kill himself in the past.  Patient endorses that his mood is currently "good", proceeds to immediately start becoming anxious and second-guessing himself, states "I am not sure that is the right answer".  Patient reports that he has been really struggling, has been excessively unable to stop moving and performing activities, as well as other goal-directed behavior.  Patient reports that in March he stopped taking his medications, because he has felt that "I really do not need them".  Patient reports outside of mushrooms and cannabis in attempts to self medicate, has not been using any other drugs, but does smoke tobacco, and infrequently utilizes EtOH. Reports last mushroom use and cannabis use last week on Tuesday.  Patient  reports that he has been hospitalized numerous times for "mania".  Patient endorses no history of  self injures behavior and/or formal suicide attempts.  Patient able to tell me that Dr. Lolly Mustache is his outpatient provider, sees him regularly, which is consistent with chart review.  Discussed with the patient inpatient hospitalization to stabilize him for safety, given the patient's severe presentation.  Patient reported that he was amenable to hospitalization for stabilization, as well as medication management.  Discussed with the patient I would be speaking to his father as well. Patient denies current AVH, does endorse history of this.  Collateral obtained (Father, [name], [phone])  Call placed and collateral obtained from the patient's Father Matthew Padilla.  Father confirms that the patient has been severely hyperactive, disorganized, appearing "manic", aggressive, and not himself, very likely not been taking his medications for a very long time.  Family reports that they have had suspicion that he has not been taking his medications, reports that typically when he is on medications he does very well, has been stable and going to college, as well as has a job at [employer], and has been able to function in society. Family describes that the patient goes through somewhat rapid cycling incidences of severe depressive episodes and periods of mania throughout the year, but last hospitalization was in 2022 at [facility], since then, has had a recrudescence that is of the aformentioned and listed above, but nothing like how the patient has presented since approximately last Thursday. Father reports that since last Thursday, the patient has been reporting he has not been feeling well, has been going out extremely late at night to eat breakfast, has not been sleeping hardly at all, has been having severely increased goal-directed behavior and performing exercise 2 or 3 times in the morning, and the day before admission to this current encounter, the patient yesterday was acting very psychotic, became very  aggressive with family on the back porch deck, and proceeded to rip a light fixture off of the wall, and was speaking in a coherent/disorganized manner, with "high energy".  Patient additionally last Thursday called the police crying for help, stating he was not himself, but nothing it appears came of this.  Father reports that the patient has been hospitalized 3 times in total for decompensation of his mental health.  Family reports that they are concerned for his safety, want to see him get inpatient hospitalization and treatment to get stabilization.   Past Psychiatric History:   [facility]  H/O schizophrenia.  Admitted in 2019 at [facility] after having suicidal thoughts to crash his car but no intent.  D/C on REXULTI and Wellbutrin but stopped after 4 weeks as no changed. H/O inpatient in October 2022 at [facility] for bizarre behavior and psychosis.  Given Haldol but stopped due to excessive drooling and sedation.  Olanzapine worked but weight gain.   Risk to Self or Others: Is the patient at risk to self? Yes Has the patient been a risk to self in the past 6 months? No Has the patient been a risk to self within the distant past? Yes Is the patient a risk to others? No Has the patient been a risk to others in the past 6 months? No Has the patient been a risk to others within the distant past? No  [place] Scale:  Flowsheet Row ED from 04/18/2023 in [facility] at [facility] from  02/15/2023 in [facility] at [facility] from 01/05/2023 in [facility] at [facility] RISK CATEGORY No Risk Error: Question 1 not populated No Risk       Substance Abuse: Mushrooms, cannabis, EtOH  Past Medical History:  Past Medical History:  Diagnosis Date   Anxiety    Low testosterone in male    Paranoid schizophrenia (HCC)     History reviewed. No pertinent surgical history. Family History: History reviewed. No pertinent family history. Family Psychiatric  History: Bipolar Social History:  Social History   Substance and Sexual Activity  Alcohol Use Not Currently     Social History   Substance and Sexual Activity  Drug Use Never    Social History   Socioeconomic History   Marital status: Single    Spouse name: Not on file   Number of children: Not on file   Years of education: Not on file   Highest education level: Not on file  Occupational History   Not on file  Tobacco Use   Smoking status: Never   Smokeless tobacco: Never  Substance and Sexual Activity   Alcohol use: Not Currently   Drug use: Never   Sexual activity: Not on file  Other Topics Concern   Not on file  Social History Narrative   Not on file   Social Determinants of Health   Financial Resource Strain: Not on file  Food Insecurity: Not on file  Transportation Needs: Not on file  Physical Activity: Not on file  Stress: Not on file  Social Connections: Not on file   Additional Social History:    Allergies:  No Known Allergies  Labs:  Results for orders placed or performed during the hospital encounter of 04/18/23 (from the past 48 hour(s))  CBC with Differential     Status: Abnormal   Collection Time: 04/18/23  3:58 AM  Result Value Ref Range   WBC 8.7 4.0 - 10.5 K/uL   RBC 5.74 4.22 - 5.81 MIL/uL   Hemoglobin 13.9 13.0 - 17.0 g/dL   HCT 16.1 09.6 - 04.5 %   MCV 77.9 (L) 80.0 - 100.0 fL   MCH 24.2 (L) 26.0 - 34.0 pg   MCHC 31.1 30.0 - 36.0 g/dL   RDW 40.9 (H) 81.1 - 91.4 %   Platelets 242 150 - 400 K/uL   nRBC 0.0 0.0 - 0.2 %   Neutrophils Relative % 50 %   Neutro Abs 4.4 1.7 - 7.7 K/uL   Lymphocytes Relative 39 %   Lymphs Abs 3.3 0.7 - 4.0 K/uL   Monocytes Relative 9 %   Monocytes Absolute 0.8 0.1 - 1.0 K/uL   Eosinophils Relative 1 %   Eosinophils Absolute 0.1 0.0 - 0.5 K/uL   Basophils Relative 1 %    Basophils Absolute 0.0 0.0 - 0.1 K/uL   Immature Granulocytes 0 %   Abs Immature Granulocytes 0.03 0.00 - 0.07 K/uL    Comment: Performed at [facility], [address] [place], [place] [address]  Comprehensive metabolic panel     Status: Abnormal   Collection Time: 04/18/23  3:58 AM  Result Value Ref Range   Sodium 137 135 - 145 mmol/L   Potassium 3.6 3.5 - 5.1 mmol/L   Chloride 105 98 - 111 mmol/L   CO2 23 22 - 32 mmol/L   Glucose, Bld 115 (H) 70 - 99 mg/dL    Comment: Glucose reference range applies only  to samples taken after fasting for at least 8 hours.   BUN 16 6 - 20 mg/dL   Creatinine, Ser 1.61 0.61 - 1.24 mg/dL   Calcium 8.8 (L) 8.9 - 10.3 mg/dL   Total Protein 6.6 6.5 - 8.1 g/dL   Albumin 3.8 3.5 - 5.0 g/dL   AST 51 (H) 15 - 41 U/L   ALT 44 0 - 44 U/L   Alkaline Phosphatase 47 38 - 126 U/L   Total Bilirubin 0.7 0.3 - 1.2 mg/dL   GFR, Estimated >09 >60 mL/min    Comment: (NOTE) Calculated using the CKD-EPI Creatinine Equation (2021)    Anion gap 9 5 - 15    Comment: Performed at [facility], [address], [place], [place] [address]  Troponin I (High Sensitivity)     Status: Abnormal   Collection Time: 04/18/23  3:58 AM  Result Value Ref Range   Troponin I (High Sensitivity) 19 (H) <18 ng/L    Comment: (NOTE) Elevated high sensitivity troponin I (hsTnI) values and significant  changes across serial measurements may suggest ACS but many other  chronic and acute conditions are known to elevate hsTnI results.  Refer to the "Links" section for chest pain algorithms and additional  guidance. Performed at [facility], [address], [place], [place] [address]   CK     Status: Abnormal   Collection Time: 04/18/23  3:58 AM  Result Value Ref Range   Total CK 1,996 (H) 49 - 397 U/L    Comment: Performed at [facility], [address] [place], [place] [address]  Urinalysis, Routine w reflex microscopic -Urine, Clean Catch     Status:  Abnormal   Collection Time: 04/18/23  4:43 AM  Result Value Ref Range   Color, Urine STRAW (A) YELLOW   APPearance CLEAR CLEAR   Specific Gravity, Urine 1.005 1.005 - 1.030   pH 6.0 5.0 - 8.0   Glucose, UA NEGATIVE NEGATIVE mg/dL   Hgb urine dipstick NEGATIVE NEGATIVE   Bilirubin Urine NEGATIVE NEGATIVE   Ketones, ur NEGATIVE NEGATIVE mg/dL   Protein, ur NEGATIVE NEGATIVE mg/dL   Nitrite NEGATIVE NEGATIVE   Leukocytes,Ua NEGATIVE NEGATIVE    Comment: Performed at [facility], [address], [place], [place] [address]  Urine rapid drug screen (hosp performed)     Status: Abnormal   Collection Time: 04/18/23  4:43 AM  Result Value Ref Range   Opiates NONE DETECTED NONE DETECTED   Cocaine NONE DETECTED NONE DETECTED   Benzodiazepines NONE DETECTED NONE DETECTED   Amphetamines NONE DETECTED NONE DETECTED   Tetrahydrocannabinol POSITIVE (A) NONE DETECTED   Barbiturates NONE DETECTED NONE DETECTED    Comment: (NOTE) DRUG SCREEN FOR MEDICAL PURPOSES ONLY.  IF CONFIRMATION IS NEEDED FOR ANY PURPOSE, NOTIFY LAB WITHIN 5 DAYS.  LOWEST DETECTABLE LIMITS FOR URINE DRUG SCREEN Drug Class                     Cutoff (ng/mL) Amphetamine and metabolites    1000 Barbiturate and metabolites    200 Benzodiazepine                 200 Opiates and metabolites        300 Cocaine and metabolites        300 THC  50 Performed at [facility], [address], [place], [place] [address]   Ethanol     Status: None   Collection Time: 04/18/23  5:36 AM  Result Value Ref Range   Alcohol, Ethyl (B) <10 <10 mg/dL    Comment: (NOTE) Lowest detectable limit for serum alcohol is 10 mg/dL.  For medical purposes only. Performed at [facility], [address], [place], [place] [address]   Troponin I (High Sensitivity)     Status: Abnormal   Collection Time: 04/18/23  5:36 AM  Result Value Ref Range   Troponin I (High Sensitivity) 18 (H) <18 ng/L     Comment: (NOTE) Elevated high sensitivity troponin I (hsTnI) values and significant  changes across serial measurements may suggest ACS but many other  chronic and acute conditions are known to elevate hsTnI results.  Refer to the "Links" section for chest pain algorithms and additional  guidance. Performed at [facility], [address], [place], [place] [address]     No current facility-administered medications for this encounter.   Current Outpatient Medications  Medication Sig Dispense Refill   acetaminophen (TYLENOL) 500 MG tablet Take 500 mg by mouth every 6 (six) hours as needed for moderate pain.     clomiPHENE (CLOMID) 50 MG tablet Take 1 tablet (50 mg total) by mouth daily. 30 tablet 6   ibuprofen (ADVIL) 200 MG tablet Take 200-400 mg by mouth every 6 (six) hours as needed for moderate pain.     risperiDONE (RISPERDAL) 1 MG tablet TAKE 1 TABLET(1 MG) BY MOUTH AT BEDTIME 30 tablet 2   sertraline (ZOLOFT) 50 MG tablet Take 0.5-1 tablets (25-50 mg total) by mouth daily. (Patient not taking: Reported on 04/18/2023) 30 tablet 2    Musculoskeletal: Strength & Muscle Tone: increased Gait & Station: normal Patient leans: N/A   Psychiatric Specialty Exam: Presentation  General Appearance:  Other (comment) (Mildly unkept, oddly related, atypical interpersonal style with latent agitation and paranoid edge)  Eye Contact: Other (comment) (Variable)  Speech: Other (comment) (Halted, abrupt pattern, intermittently increased and somewhat pressured)  Speech Volume: Normal  Handedness: Right   Mood and Affect  Mood: -- ("Good")  Affect: Other (comment) (Animated, intermittent odd and inappropriate brightening)   Thought Process  Thought Processes: Other (comment) (Superficially linear and goal directed to frequently disorganized with expressions of paranoia)  Descriptions of Associations:Loose  Orientation:Full (Time, Place and Person)  Thought  Content:Paranoid Ideation; Other (comment); Scattered (Superficially linear and goal directed to frequently disorganized with expressions of paranoia)  History of Schizophrenia/Schizoaffective disorder:Yes  Duration of Psychotic Symptoms:Greater than six months  Hallucinations:Hallucinations: Other (comment); Auditory (Denies, but RTIS) Description of Auditory Hallucinations: Denies currently  Ideas of Reference:Paranoia  Suicidal Thoughts:Suicidal Thoughts: No  Homicidal Thoughts:Homicidal Thoughts: No   Sensorium  Memory: Immediate Fair; Recent Fair; Remote Fair  Judgment: Impaired  Insight: Lacking   Executive Functions  Concentration: Poor  Attention Span: Poor  Recall: [employer] of Knowledge: Fair  Language: Fair   Psychomotor Activity  Psychomotor Activity: Psychomotor Activity: Increased; Restlessness   Assets  Assets: Manufacturing systems engineer; Desire for Improvement; Financial Resources/Insurance; Intimacy; Housing; Leisure Time; Physical Health; Resilience; Social Support; Talents/Skills; Transportation; Vocational/Educational    Sleep  Sleep: Sleep: Poor   Physical Exam: Physical Exam Vitals and nursing note reviewed.  Constitutional:      General: He is not in acute distress.    Appearance: He is obese. He is not ill-appearing, toxic-appearing  or diaphoretic.  Pulmonary:     Effort: Pulmonary effort is normal.  Neurological:     Mental Status: He is alert and oriented to person, place, and time.  Psychiatric:        Attention and Perception: He is inattentive. He perceives auditory (Denies currently, but observed RTIS) hallucinations. He does not perceive visual hallucinations.        Mood and Affect: Elated: "Good". Affect is inappropriate.        Speech: Speech is rapid and pressured (Intermittently), delayed and tangential (Thought disorganization).        Behavior: Behavior is agitated. Behavior is not aggressive. Behavior is  cooperative.        Thought Content: Thought content is paranoid. Thought content is not delusional. Thought content does not include homicidal or suicidal ideation.        Judgment: Judgment is impulsive and inappropriate.    Review of Systems  Psychiatric/Behavioral:  Positive for hallucinations (Denies currently but observed RTIS) and substance abuse (Mushrooms; Cannabis). Negative for depression and suicidal ideas. The patient is nervous/anxious (Paranoid) and has insomnia.   All other systems reviewed and are negative.  Blood pressure (!) 163/79, pulse 92, temperature 99.6 F (37.6 C), temperature source Oral, resp. rate 16, height 6\' 3"  (1.905 m), weight (!) 171.9 kg, SpO2 100%. Body mass index is 47.37 kg/m.  Medical Decision Making:  Diagnostically, the patient appears to be presenting with symptomology that is most consistent with either the patient's historical and current diagnosis of paranoid schizophrenia, and/or if potentially more of a schizoaffective disorder bipolar type, if not a primary bipolar affective disorder with psychotic features. Nonetheless, patient presents significantly with impaired judgment, inability to reasonably care for himself, responding to internal stimuli, presenting with severe aggression and agitation and impulsivity, making him a danger to himself and others, and reasonably without extensive supervision and care, unable to maintain his safety, and additionally presents with poor self-control, judgment, and ability to use discretion in the conduct of himself. Patient at this time is recommended to be upheld on his involuntary commitment, given the presentation of active psychosis and manic symptomology, which makes him a danger to himself and others and without meaningful capacity.  Patient at this time is recommended for inpatient mental health hospitalization for stabilization and safety.  Given the patient's mild rhabdomyolysis, notably CK of 1,996 @03 :58,  will recommend refraining from antipsychotic or mood stabilizing agents at this time, until CK level is meaningfully downtrending, <1,000. Will however recommend Ativan as needed 2 mg p.o. or IM for agitation that becomes appreciable, given its safety profile during active elevation of CK and mild rhabdomyolysis.  Psychiatry will continue to follow the patient until disposition is obtained, already reviewed at [facility], no beds available, CSW team already in process of faxing the patient now for disposition.  Recommendations  # Paranoid schizophrenia   #R/O schizoaffective disorder bipolar type #R/O bipolar affective disorder with psychotic features  -Recommend inpatient mental health hospitalization -Recommend continuation of IVC -Hold antipsychotics/home medication of Risperdal until improvement of CK and mild rhabdomyolysis (I.e., CK <1k) -Recommend repeat of CK level to confirm downtrending for both starting of antipsychotics, as well as to meet admission criteria for inpatient hospitalization at mental health facility -Recommend Ativan 2 mg p.o. or IM 3 times daily for agitation, given safety profile and its use during rhabdomyolysis  Disposition: Recommend psychiatric Inpatient admission when medically cleared.  Lenox Ponds, NP 04/18/2023 9:40 AM

## 2023-04-18 NOTE — ED Notes (Signed)
Case # [id number]

## 2023-04-18 NOTE — ED Notes (Signed)
Pt repeatedly asking if he can just leave. Pt encouraged to stay in bed so that the fluids will finish infusing due to his high CK. Pt shortly after started screaming in the hallway "take this shit out". Pt verbally re-directed. Agreeable to walking back to bed with this RN and MD

## 2023-04-18 NOTE — ED Notes (Signed)
PT handed this RN, car keys, wallet and cell phone. Placed in bag and labeled

## 2023-04-18 NOTE — ED Notes (Signed)
This RN spoke to pt's mom [name]. [name] states that last night pt had a manic episode, not speaking for 30 minutes. Pt was pacing back and forth. Per [name], she hid all the car keys so that pt wouldn't leave, however pt must have found an extra set of keys and drove his dad's car to the hospital. [name] states that pt has not been taking his medications consistently, which makes him erratic. Pt has also been taking THC gummies. Per [name] pt took three last night. [name] states that when pt is taking his medication appropriately he is able to participate in society and has a job at [employer]

## 2023-04-18 NOTE — ED Notes (Signed)
Patient was getting agitated and started to yell and cuss. Started to rub hand sanitizer in his eyes. Went and asked PA for something stronger then ativan. Patient seemed to be getting more and more agitated. Had security and [employer] in the room with me when I gave Geodon. Continuing to monitor at this time

## 2023-04-18 NOTE — ED Notes (Signed)
Pt refused labs.  

## 2023-04-18 NOTE — ED Provider Notes (Signed)
[facility] EMERGENCY DEPARTMENT AT [facility] Provider Note   CSN: [id number] Arrival date & time: 04/18/23  4098     History  Chief Complaint  Patient presents with   Tachycardia    Matthew Padilla is a 27 y.o. male.  Patient with past medical history significant for paranoid schizophrenia, anxiety, low testosterone presents to the emergency department complaining of palpitations/tachycardia, anxiety, increased stress.  He denies chest pain, shortness of breath, abdominal pain, headache.  He states he normally takes Risperdal and sertraline but that when he went for refills yesterday there was no sertraline for him.  He states he has been walking a lot tonight and has had increased stress.  HPI     Home Medications Prior to Admission medications   Medication Sig Start Date End Date Taking? Authorizing Provider  clomiPHENE (CLOMID) 50 MG tablet Take 1 tablet (50 mg total) by mouth daily. 10/26/22   Shamleffer, Konrad Dolores, MD  risperiDONE (RISPERDAL) 1 MG tablet TAKE 1 TABLET(1 MG) BY MOUTH AT BEDTIME 04/07/23   Arfeen, Phillips Grout, MD  sertraline (ZOLOFT) 50 MG tablet Take 0.5-1 tablets (25-50 mg total) by mouth daily. 04/07/23   Arfeen, Phillips Grout, MD      Allergies    Patient has no known allergies.    Review of Systems   Review of Systems  Physical Exam Updated Vital Signs BP (!) 163/79 (BP Location: Left Arm)   Pulse 92   Temp 99.6 F (37.6 C) (Oral)   Resp 16   Ht 6\' 3"  (1.905 m)   Wt (!) 171.9 kg   SpO2 100%   BMI 47.37 kg/m  Physical Exam Vitals and nursing note reviewed.  Constitutional:      General: He is not in acute distress.    Appearance: He is well-developed.  HENT:     Head: Normocephalic and atraumatic.  Eyes:     Conjunctiva/sclera: Conjunctivae normal.     Pupils: Pupils are equal, round, and reactive to light.  Cardiovascular:     Rate and Rhythm: Regular rhythm. Tachycardia present.     Heart sounds: No murmur heard. Pulmonary:      Effort: Pulmonary effort is normal. No respiratory distress.     Breath sounds: Normal breath sounds.  Abdominal:     Palpations: Abdomen is soft.     Tenderness: There is no abdominal tenderness.  Musculoskeletal:        General: No swelling.     Cervical back: Neck supple.  Skin:    General: Skin is warm and dry.     Capillary Refill: Capillary refill takes less than 2 seconds.  Neurological:     Mental Status: He is alert.  Psychiatric:     Comments: Patient anxious, pacing in hallway     ED Results / Procedures / Treatments   Labs (all labs ordered are listed, but only abnormal results are displayed) Labs Reviewed  CBC WITH DIFFERENTIAL/PLATELET - Abnormal; Notable for the following components:      Result Value   MCV 77.9 (*)    MCH 24.2 (*)    RDW 17.2 (*)    All other components within normal limits  COMPREHENSIVE METABOLIC PANEL - Abnormal; Notable for the following components:   Glucose, Bld 115 (*)    Calcium 8.8 (*)    AST 51 (*)    All other components within normal limits  URINALYSIS, ROUTINE W REFLEX MICROSCOPIC - Abnormal; Notable for the following components:   Color,  Urine STRAW (*)    All other components within normal limits  RAPID URINE DRUG SCREEN, HOSP PERFORMED - Abnormal; Notable for the following components:   Tetrahydrocannabinol POSITIVE (*)    All other components within normal limits  CK - Abnormal; Notable for the following components:   Total CK 1,996 (*)    All other components within normal limits  TROPONIN I (HIGH SENSITIVITY) - Abnormal; Notable for the following components:   Troponin I (High Sensitivity) 19 (*)    All other components within normal limits  TROPONIN I (HIGH SENSITIVITY) - Abnormal; Notable for the following components:   Troponin I (High Sensitivity) 18 (*)    All other components within normal limits  ETHANOL    EKG EKG Interpretation Date/Time:  Sunday April 18 2023 03:48:58 EDT Ventricular Rate:  112 PR  Interval:  150 QRS Duration:  92 QT Interval:  344 QTC Calculation: 469 R Axis:   84  Text Interpretation: Sinus tachycardia Otherwise normal ECG Interpretation limited secondary to artifact No significant change since last tracing Confirmed by Zadie Rhine ([id number]) on 04/18/2023 4:32:54 AM  Radiology No results found.  Procedures Procedures    Medications Ordered in ED Medications  sodium chloride 0.9 % bolus 1,000 mL (has no administration in time range)  sodium chloride 0.9 % bolus 1,000 mL (1,000 mLs Intravenous New Bag/Given 04/18/23 0547)  LORazepam (ATIVAN) injection 2 mg (2 mg Intravenous Given 04/18/23 0604)    ED Course/ Medical Decision Making/ A&P                             Medical Decision Making Amount and/or Complexity of Data Reviewed Labs: ordered.   This patient presents to the ED for concern of tachycardia, this involves an extensive number of treatment options, and is a complaint that carries with it a high risk of complications and morbidity.  The differential diagnosis includes anxiety, psychosis, metabolic abnormality, others   Co morbidities that complicate the patient evaluation  Anxiety, paranoid schizophrenia   Additional history obtained:   External records from outside source obtained and reviewed including behavioral health notes. Mother with concerns about patient using mushrooms, THC gummies, acting manic for the past week. MD recommended evaluation at [facility]. The patient has not gone to [facility] yet   Lab Tests:  I Ordered, and personally interpreted labs.  The pertinent results include: CK 1996, initial troponin 19, UDS positive for THC   ICardiac Monitoring: / EKG:  The patient was maintained on a cardiac monitor.  I personally viewed and interpreted the cardiac monitored which showed an underlying rhythm of: Sinus tachycardia   Problem List / ED Course / Critical interventions / Medication management  I ordered medication  including normal saline for fluid resuscitation, ativan for agitation Reevaluation of the patient after these medicines showed that the patient improved I have reviewed the patients home medicines and have made adjustments as needed    Test / Admission - Considered:  Patient with elevated CK showing mild rhabdomyolysis, patient may have been experiencing a manic episode over the last week, has worsened during his encounter. Patient initially cooperative, now randomly yelling at nothing, at staff. Patient appears to be having some sort of active psychosis. IVC paperwork completed. Troponin initially 19, repeat 18, doubt ACS. Tachycardia resolved. Plan for second liter of fluid. Patient in psychiatric hold at this time awaiting TTS evaluation.  Final Clinical Impression(s) / ED Diagnoses Final diagnoses:  Tachycardia  Non-traumatic rhabdomyolysis  Paranoid schizophrenia [facility])    Rx / DC Orders ED Discharge Orders     None         Pamala Duffel 04/18/23 5784    Zadie Rhine, MD 04/18/23 [phone]

## 2023-04-18 NOTE — Progress Notes (Signed)
Patient has been denied by [facility] due to no appropriate beds available. Patient meets [facility] inpatient criteria per Arsenio Loader, NP. Patient has been faxed out to the following facilities:   [facility]  [address] [place] [place] [address] [phone]  [facility]  [address] [place] [address] [phone]  [facility]  [address] [place] [address] [phone]  [facility]  [address] [place] [address] [phone]  [facility]  [address] [place] [address] [phone]  [facility]  [address] [place] [address] [phone]  [facility]  [address] [place] [address] [phone]  [facility]  [address] [place], [place] [address] [phone]  [facility]  [address] [place], [place] [address] [phone]  [facility]  [address] [place] [address] [phone]  [facility]  [address] [place] [address] [phone]  [facility]  [address], [place] [address] [phone]  [facility]  [address], [place] [address] [phone]   Damita Dunnings, MSW, LCSW-A  10:25 AM 04/18/2023

## 2023-04-18 NOTE — ED Notes (Signed)
Locker number 1

## 2023-04-18 NOTE — ED Notes (Signed)
Pt given supplies and new change of clothes. Pt showered and back in room

## 2023-04-18 NOTE — ED Notes (Signed)
Pt refusing an IV at this time. States that he does not need it.

## 2023-04-18 NOTE — ED Notes (Signed)
Security called to bedside due to patient yelling cussing and acting out

## 2023-04-18 NOTE — ED Triage Notes (Signed)
Pt c/o feeling like his heart is racing at times. States that he has been walking tonight and is stressed out. Denies pain or SHOB.

## 2023-04-19 ENCOUNTER — Other Ambulatory Visit: Payer: Self-pay

## 2023-04-19 ENCOUNTER — Encounter (HOSPITAL_COMMUNITY): Payer: Self-pay | Admitting: Nurse Practitioner

## 2023-04-19 ENCOUNTER — Inpatient Hospital Stay (HOSPITAL_COMMUNITY)
Admission: AD | Admit: 2023-04-19 | Discharge: 2023-05-06 | DRG: 885 | Disposition: A | Discharge status: Home / Self Care | Payer: 59 | Source: Intra-hospital | Attending: Psychiatry | Admitting: Psychiatry

## 2023-04-19 ENCOUNTER — Other Ambulatory Visit: Payer: 59

## 2023-04-19 DIAGNOSIS — F203 Undifferentiated schizophrenia: Secondary | ICD-10-CM | POA: Diagnosis not present

## 2023-04-19 DIAGNOSIS — K5900 Constipation, unspecified: Secondary | ICD-10-CM | POA: Diagnosis not present

## 2023-04-19 DIAGNOSIS — M6282 Rhabdomyolysis: Secondary | ICD-10-CM | POA: Diagnosis not present

## 2023-04-19 DIAGNOSIS — J45909 Unspecified asthma, uncomplicated: Secondary | ICD-10-CM | POA: Diagnosis present

## 2023-04-19 DIAGNOSIS — R002 Palpitations: Secondary | ICD-10-CM | POA: Diagnosis not present

## 2023-04-19 DIAGNOSIS — F430 Acute stress reaction: Secondary | ICD-10-CM | POA: Diagnosis present

## 2023-04-19 DIAGNOSIS — F200 Paranoid schizophrenia: Principal | ICD-10-CM | POA: Diagnosis present

## 2023-04-19 DIAGNOSIS — F209 Schizophrenia, unspecified: Secondary | ICD-10-CM | POA: Diagnosis present

## 2023-04-19 DIAGNOSIS — G2581 Restless legs syndrome: Secondary | ICD-10-CM | POA: Diagnosis present

## 2023-04-19 DIAGNOSIS — Z79899 Other long term (current) drug therapy: Secondary | ICD-10-CM | POA: Diagnosis not present

## 2023-04-19 DIAGNOSIS — F419 Anxiety disorder, unspecified: Secondary | ICD-10-CM | POA: Diagnosis not present

## 2023-04-19 DIAGNOSIS — R000 Tachycardia, unspecified: Secondary | ICD-10-CM | POA: Diagnosis present

## 2023-04-19 MED ORDER — RISPERIDONE 1 MG PO TBDP
1.0000 mg | ORAL_TABLET | Freq: Two times a day (BID) | ORAL | Status: DC
  Administered 2023-04-19 – 2023-04-20 (×2): 1 mg via ORAL
  Filled 2023-04-19 (×5): qty 1

## 2023-04-19 MED ORDER — HALOPERIDOL LACTATE 5 MG/ML IJ SOLN
INTRAMUSCULAR | Status: AC
  Filled 2023-04-19: qty 1

## 2023-04-19 MED ORDER — ALUM & MAG HYDROXIDE-SIMETH 200-200-20 MG/5ML PO SUSP
30.0000 mL | ORAL | Status: DC | PRN
  Administered 2023-04-25: 30 mL via ORAL
  Filled 2023-04-19: qty 30

## 2023-04-19 MED ORDER — LORAZEPAM 1 MG PO TABS
2.0000 mg | ORAL_TABLET | Freq: Three times a day (TID) | ORAL | Status: DC | PRN
  Administered 2023-04-20 – 2023-04-21 (×3): 2 mg via ORAL
  Filled 2023-04-19 (×3): qty 2

## 2023-04-19 MED ORDER — ZIPRASIDONE MESYLATE 20 MG IM SOLR
20.0000 mg | Freq: Once | INTRAMUSCULAR | Status: AC
  Administered 2023-04-19: 20 mg via INTRAMUSCULAR
  Filled 2023-04-19: qty 20

## 2023-04-19 MED ORDER — ACETAMINOPHEN 325 MG PO TABS
650.0000 mg | ORAL_TABLET | Freq: Four times a day (QID) | ORAL | Status: DC | PRN
  Administered 2023-04-22 – 2023-05-03 (×5): 650 mg via ORAL
  Filled 2023-04-19 (×6): qty 2

## 2023-04-19 MED ORDER — HALOPERIDOL LACTATE 5 MG/ML IJ SOLN
5.0000 mg | Freq: Three times a day (TID) | INTRAMUSCULAR | Status: DC | PRN
  Administered 2023-04-19 – 2023-04-20 (×2): 5 mg via INTRAMUSCULAR
  Filled 2023-04-19: qty 1

## 2023-04-19 MED ORDER — DIPHENHYDRAMINE HCL 25 MG PO CAPS
50.0000 mg | ORAL_CAPSULE | Freq: Three times a day (TID) | ORAL | Status: DC | PRN
  Administered 2023-04-20 – 2023-04-21 (×3): 50 mg via ORAL
  Filled 2023-04-19 (×3): qty 2

## 2023-04-19 MED ORDER — QUETIAPINE FUMARATE 50 MG PO TABS
50.0000 mg | ORAL_TABLET | Freq: Every day | ORAL | Status: DC
  Filled 2023-04-19: qty 1

## 2023-04-19 MED ORDER — MAGNESIUM HYDROXIDE 400 MG/5ML PO SUSP
30.0000 mL | Freq: Every day | ORAL | Status: DC | PRN
  Administered 2023-05-04: 30 mL via ORAL
  Filled 2023-04-19: qty 30

## 2023-04-19 MED ORDER — HALOPERIDOL 5 MG PO TABS
5.0000 mg | ORAL_TABLET | Freq: Three times a day (TID) | ORAL | Status: DC | PRN
  Administered 2023-04-20: 5 mg via ORAL
  Filled 2023-04-19: qty 1

## 2023-04-19 MED ORDER — RISPERIDONE 1 MG PO TBDP
1.0000 mg | ORAL_TABLET | Freq: Two times a day (BID) | ORAL | Status: DC
  Filled 2023-04-19 (×2): qty 1

## 2023-04-19 MED ORDER — DIPHENHYDRAMINE HCL 50 MG/ML IJ SOLN
50.0000 mg | Freq: Three times a day (TID) | INTRAMUSCULAR | Status: DC | PRN
  Administered 2023-04-19 – 2023-04-20 (×2): 50 mg via INTRAMUSCULAR
  Filled 2023-04-19 (×2): qty 1

## 2023-04-19 MED ORDER — LORAZEPAM 2 MG/ML IJ SOLN
2.0000 mg | Freq: Three times a day (TID) | INTRAMUSCULAR | Status: DC | PRN
  Administered 2023-04-19 – 2023-04-20 (×2): 2 mg via INTRAMUSCULAR
  Filled 2023-04-19 (×2): qty 1

## 2023-04-19 MED ORDER — RISPERIDONE 1 MG PO TBDP
1.0000 mg | ORAL_TABLET | Freq: Two times a day (BID) | ORAL | Status: DC
  Administered 2023-04-19: 1 mg via ORAL
  Filled 2023-04-19 (×2): qty 1

## 2023-04-19 MED ORDER — QUETIAPINE FUMARATE 50 MG PO TABS: 50.0000 mg | ORAL_TABLET | Freq: Every day | ORAL | Status: DC

## 2023-04-19 NOTE — ED Notes (Signed)
Transport has arrived & all belongings & paperwork has been given to the officers at time for pt to be transported to [facility].

## 2023-04-19 NOTE — ED Notes (Signed)
Psych NP at bedside

## 2023-04-19 NOTE — Progress Notes (Signed)
Pt has been accepted to [facility] TODAY 04/19/2023 pending updated vital signs. Bed assignment: 507-1  Pt meets inpatient criteria per Eligha Bridegroom, NP  Attending Physician will be Phineas Inches, MD  Report can be called to: - Adult unit: [phone]  Pt can arrive after pending items are received  Care Team Notified: [facility] Rona Ravens, RN, Eligha Bridegroom, NP, [facility], RN, and [place], [place]  [place], [place]  04/19/2023 12:22 PM

## 2023-04-19 NOTE — Progress Notes (Signed)
[facility] Psych ED Progress Note  04/19/2023 11:44 AM Matthew Padilla  MRN:  [id number]   Subjective:   Patient seen at [facility] for face to face psychiatric reevaluation. Pt is moderately cooperative, labile, pressured speech, somewhat irritable, and tangential. Pt rambles about not liking any medications, he doesn't need medications, he doesn't like his psychiatrist, etc. 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 Pt does deny SI/HI/AVH. He does not appear internally preoccupied, does not appear to be RTIS. However, per chart review pt does not appear to be at baseline functioning, and appears more elevated and disorganized. Per collateral from family yesterday, they also feel like he is far from baseline with manic/erratic behaviors.   CK levels were down trending, will restart Risperidone 1 mg for BID. Also will add Seroquel 50 mg at bedtime to assist with sleep and further mood stabilization. Will hold Zoloft 50 mg at this time. Will continue to recommend IVC and inpatient psychiatric treatment.  Principal Problem: Schizophrenia, paranoid (HCC) Diagnosis:  Principal Problem:   Schizophrenia, paranoid [facility])   ED Assessment Time Calculation: Start Time: 1130 Stop Time: 1200 Total Time in Minutes (Assessment Completion): 30   Past Psychiatric History:  Paranoid schizophrenia  [place] Scale:  Flowsheet Row ED from 04/18/2023 in [facility] Emergency Department at [facility] Counselor from 02/15/2023 in [facility] Outpatient Behavioral Health at [facility] from 01/05/2023 in [facility] Health Outpatient Behavioral Health at [facility] RISK CATEGORY No Risk Error: Question 1 not populated No Risk       Past Medical History:  Past Medical History:  Diagnosis Date   Anxiety    Low testosterone in male    Paranoid schizophrenia (HCC)    History reviewed. No pertinent surgical history. Family History: History  reviewed. No pertinent family history.  Social History:  Social History   Substance and Sexual Activity  Alcohol Use Not Currently     Social History   Substance and Sexual Activity  Drug Use Never    Social History   Socioeconomic History   Marital status: Single    Spouse name: Not on file   Number of children: Not on file   Years of education: Not on file   Highest education level: Not on file  Occupational History   Not on file  Tobacco Use   Smoking status: Never   Smokeless tobacco: Never  Substance and Sexual Activity   Alcohol use: Not Currently   Drug use: Never   Sexual activity: Not on file  Other Topics Concern   Not on file  Social History Narrative   Not on file   Social Determinants of Health   Financial Resource Strain: Not on file  Food Insecurity: Not on file  Transportation Needs: Not on file  Physical Activity: Not on file  Stress: Not on file  Social Connections: Not on file    Sleep: Poor  Appetite:  Good  Current Medications: Current Facility-Administered Medications  Medication Dose Route Frequency Provider Last Rate Last Admin   LORazepam (ATIVAN) tablet 2 mg  2 mg Oral TID PRN Lenox Ponds, NP   2 mg at 04/18/23 1153   Or   LORazepam (ATIVAN) injection 2 mg  2 mg Intramuscular TID PRN Lenox Ponds, NP   2 mg at 04/19/23 0015   QUEtiapine (SEROQUEL) tablet 50 mg  50 mg Oral QHS Eligha Bridegroom, NP  risperiDONE (RISPERDAL M-TABS) disintegrating tablet 1 mg  1 mg Oral BID Eligha Bridegroom, NP   1 mg at 04/19/23 1058   Current Outpatient Medications  Medication Sig Dispense Refill   acetaminophen (TYLENOL) 500 MG tablet Take 500 mg by mouth every 6 (six) hours as needed for moderate pain.     clomiPHENE (CLOMID) 50 MG tablet Take 1 tablet (50 mg total) by mouth daily. 30 tablet 6   ibuprofen (ADVIL) 200 MG tablet Take 200-400 mg by mouth every 6 (six) hours as needed for moderate pain.     risperiDONE (RISPERDAL) 1 MG  tablet TAKE 1 TABLET(1 MG) BY MOUTH AT BEDTIME 30 tablet 2   sertraline (ZOLOFT) 50 MG tablet Take 0.5-1 tablets (25-50 mg total) by mouth daily. (Patient not taking: Reported on 04/18/2023) 30 tablet 2    Lab Results:  Results for orders placed or performed during the hospital encounter of 04/18/23 (from the past 48 hour(s))  CBC with Differential     Status: Abnormal   Collection Time: 04/18/23  3:58 AM  Result Value Ref Range   WBC 8.7 4.0 - 10.5 K/uL   RBC 5.74 4.22 - 5.81 MIL/uL   Hemoglobin 13.9 13.0 - 17.0 g/dL   HCT 09.8 11.9 - 14.7 %   MCV 77.9 (L) 80.0 - 100.0 fL   MCH 24.2 (L) 26.0 - 34.0 pg   MCHC 31.1 30.0 - 36.0 g/dL   RDW 82.9 (H) 56.2 - 13.0 %   Platelets 242 150 - 400 K/uL   nRBC 0.0 0.0 - 0.2 %   Neutrophils Relative % 50 %   Neutro Abs 4.4 1.7 - 7.7 K/uL   Lymphocytes Relative 39 %   Lymphs Abs 3.3 0.7 - 4.0 K/uL   Monocytes Relative 9 %   Monocytes Absolute 0.8 0.1 - 1.0 K/uL   Eosinophils Relative 1 %   Eosinophils Absolute 0.1 0.0 - 0.5 K/uL   Basophils Relative 1 %   Basophils Absolute 0.0 0.0 - 0.1 K/uL   Immature Granulocytes 0 %   Abs Immature Granulocytes 0.03 0.00 - 0.07 K/uL    Comment: Performed at [facility], [address] [place], [place] [address]  Comprehensive metabolic panel     Status: Abnormal   Collection Time: 04/18/23  3:58 AM  Result Value Ref Range   Sodium 137 135 - 145 mmol/L   Potassium 3.6 3.5 - 5.1 mmol/L   Chloride 105 98 - 111 mmol/L   CO2 23 22 - 32 mmol/L   Glucose, Bld 115 (H) 70 - 99 mg/dL    Comment: Glucose reference range applies only to samples taken after fasting for at least 8 hours.   BUN 16 6 - 20 mg/dL   Creatinine, Ser 4.69 0.61 - 1.24 mg/dL   Calcium 8.8 (L) 8.9 - 10.3 mg/dL   Total Protein 6.6 6.5 - 8.1 g/dL   Albumin 3.8 3.5 - 5.0 g/dL   AST 51 (H) 15 - 41 U/L   ALT 44 0 - 44 U/L   Alkaline Phosphatase 47 38 - 126 U/L   Total Bilirubin 0.7 0.3 - 1.2 mg/dL   GFR, Estimated >62 >95 mL/min     Comment: (NOTE) Calculated using the CKD-EPI Creatinine Equation (2021)    Anion gap 9 5 - 15    Comment: Performed at [facility], [address] [place], [place] [address]  Troponin I (High Sensitivity)     Status: Abnormal  Collection Time: 04/18/23  3:58 AM  Result Value Ref Range   Troponin I (High Sensitivity) 19 (H) <18 ng/L    Comment: (NOTE) Elevated high sensitivity troponin I (hsTnI) values and significant  changes across serial measurements may suggest ACS but many other  chronic and acute conditions are known to elevate hsTnI results.  Refer to the "Links" section for chest pain algorithms and additional  guidance. Performed at [facility], [address] [place], [place] [address]   CK     Status: Abnormal   Collection Time: 04/18/23  3:58 AM  Result Value Ref Range   Total CK 1,996 (H) 49 - 397 U/L    Comment: Performed at [facility], [address], [place], [place] [address]  Urinalysis, Routine w reflex microscopic -Urine, Clean Catch     Status: Abnormal   Collection Time: 04/18/23  4:43 AM  Result Value Ref Range   Color, Urine STRAW (A) YELLOW   APPearance CLEAR CLEAR   Specific Gravity, Urine 1.005 1.005 - 1.030   pH 6.0 5.0 - 8.0   Glucose, UA NEGATIVE NEGATIVE mg/dL   Hgb urine dipstick NEGATIVE NEGATIVE   Bilirubin Urine NEGATIVE NEGATIVE   Ketones, ur NEGATIVE NEGATIVE mg/dL   Protein, ur NEGATIVE NEGATIVE mg/dL   Nitrite NEGATIVE NEGATIVE   Leukocytes,Ua NEGATIVE NEGATIVE    Comment: Performed at [facility], [address], [place], [place] [address]  Urine rapid drug screen (hosp performed)     Status: Abnormal   Collection Time: 04/18/23  4:43 AM  Result Value Ref Range   Opiates NONE DETECTED NONE DETECTED   Cocaine NONE DETECTED NONE DETECTED   Benzodiazepines NONE DETECTED NONE DETECTED   Amphetamines NONE DETECTED NONE DETECTED   Tetrahydrocannabinol POSITIVE (A) NONE DETECTED   Barbiturates NONE DETECTED  NONE DETECTED    Comment: (NOTE) DRUG SCREEN FOR MEDICAL PURPOSES ONLY.  IF CONFIRMATION IS NEEDED FOR ANY PURPOSE, NOTIFY LAB WITHIN 5 DAYS.  LOWEST DETECTABLE LIMITS FOR URINE DRUG SCREEN Drug Class                     Cutoff (ng/mL) Amphetamine and metabolites    1000 Barbiturate and metabolites    200 Benzodiazepine                 200 Opiates and metabolites        300 Cocaine and metabolites        300 THC                            50 Performed at [facility], [address], [place], [place] [address]   Ethanol     Status: None   Collection Time: 04/18/23  5:36 AM  Result Value Ref Range   Alcohol, Ethyl (B) <10 <10 mg/dL    Comment: (NOTE) Lowest detectable limit for serum alcohol is 10 mg/dL.  For medical purposes only. Performed at [facility], [address] [place], [place] [address]   Troponin I (High Sensitivity)     Status: Abnormal   Collection Time: 04/18/23  5:36 AM  Result Value Ref Range   Troponin I (High Sensitivity) 18 (H) <18 ng/L    Comment: (NOTE) Elevated high sensitivity troponin I (hsTnI) values and significant  changes across serial measurements may suggest ACS but many other  chronic and acute conditions are known  to elevate hsTnI results.  Refer to the "Links" section for chest pain algorithms and additional  guidance. Performed at [facility], [address], [place], [place] [address]   CK     Status: Abnormal   Collection Time: 04/18/23 10:54 PM  Result Value Ref Range   Total CK 1,183 (H) 49 - 397 U/L    Comment: Performed at [facility], [address] [place], [place] [address]    Blood Alcohol level:  Lab Results  Component Value Date   [facility] <10 04/18/2023   ETH <10 07/08/2021    Psychiatric Specialty Exam:  Presentation  General Appearance:  Fairly Groomed  Eye Contact: Fair  Speech: Pressured  Speech Volume: Normal  Handedness: Right   Mood and Affect  Mood: Irritable;  Labile  Affect: Congruent   Thought Process  Thought Processes: Disorganized  Descriptions of Associations:Tangential  Orientation:Full (Time, Place and Person)  Thought Content:Paranoid Ideation; Tangential; Illogical  History of Schizophrenia/Schizoaffective disorder:Yes  Duration of Psychotic Symptoms:Greater than six months  Hallucinations:Hallucinations: None Description of Auditory Hallucinations: Denies currently  Ideas of Reference:Paranoia  Suicidal Thoughts:Suicidal Thoughts: No  Homicidal Thoughts:Homicidal Thoughts: No   Sensorium  Memory: Immediate Fair; Recent Fair  Judgment: Impaired  Insight: Lacking   Executive Functions  Concentration: Fair  Attention Span: Fair  Recall: Fair  Fund of Knowledge: Fair  Language: Fair   Psychomotor Activity  Psychomotor Activity: Psychomotor Activity: Restlessness   Assets  Assets: Desire for Improvement; Physical Health; Social Support   Sleep  Sleep: Sleep: Poor    Physical Exam: Physical Exam Neurological:     Mental Status: He is alert and oriented to person, place, and time.  Psychiatric:        Attention and Perception: He is inattentive.        Mood and Affect: Affect is labile.        Speech: Speech is rapid and pressured.        Behavior: Behavior is cooperative.        Thought Content: Thought content is paranoid.    Review of Systems  Psychiatric/Behavioral:         Pressured speech, paranoid, disorganized  All other systems reviewed and are negative.  Blood pressure (!) 144/82, pulse (!) 117, temperature 98.4 F (36.9 C), temperature source Oral, resp. rate 16, height 6\' 3"  (1.905 m), weight (!) 171.9 kg, SpO2 98%. Body mass index is 47.37 kg/m.   Medical Decision Making: Pt case reviewed and discussed with Dr. Lucianne Muss. Pt continues to meet criteria for IVC and inpatient psychiatric treatment. Pt being reviewed by [facility], if no availability will request CSW fax  out.  Problem 1: Schizophrenia vs schizoaffective bipolar type - Continue Risperidone, increase to 1 mg BID - Start Seroquel 50 mg at bedtime to assist with sleep and further mood stability  Eligha Bridegroom, NP 04/19/2023, 11:44 AM

## 2023-04-19 NOTE — Group Note (Signed)
Date:  04/19/2023 Time:  8:35 PM  Group Topic/Focus:  Wrap-Up Group:   The focus of this group is to help patients review their daily goal of treatment and discuss progress on daily workbooks.    Participation Level:  Did Not Attend   Scot Dock 04/19/2023, 8:35 PM

## 2023-04-19 NOTE — ED Notes (Signed)
Report was called to [facility] for rm 507-1 to accepting RN Lanora Manis, RN. She informed this RN that the room was still not ready & that he can arrive at 1500.

## 2023-04-19 NOTE — ED Notes (Signed)
All appropriate paperwork & belongings have been gathered & prepared for when pt leaves for [facility].

## 2023-04-19 NOTE — ED Notes (Signed)
Pt getting agitated from another pt having their own outburst & slamming his door.

## 2023-04-19 NOTE — ED Notes (Signed)
[facility] has been called & transportation has been set up & now waiting for their arrival to transport pt to [facility].

## 2023-04-19 NOTE — ED Notes (Signed)
This RN was informed his bed is ready at [facility]. Calling transport now.

## 2023-04-19 NOTE — ED Provider Notes (Signed)
Emergency Medicine Observation Re-evaluation Note  Matthew Padilla is a 27 y.o. male, seen on rounds today.  Pt initially presented to the ED for complaints of Tachycardia Currently, the patient is awake, standing.  Per nurse, patient has engaged in disruptive behavior, but is directable most of the time.  Physical Exam  BP (!) 166/84 (BP Location: Left Arm)   Pulse (!) 131   Temp 98.7 F (37.1 C) (Oral)   Resp 20   Ht 6\' 3"  (1.905 m)   Wt (!) 171.9 kg   SpO2 99%   BMI 47.37 kg/m  Physical Exam General: No distress Cardiac: Regular rate Lungs: No respiratory distress Psych: Calm  ED Course / MDM  EKG:EKG Interpretation Date/Time:  Sunday April 18 2023 03:48:58 EDT Ventricular Rate:  112 PR Interval:  150 QRS Duration:  92 QT Interval:  344 QTC Calculation: 469 R Axis:   84  Text Interpretation: Sinus tachycardia Otherwise normal ECG Interpretation limited secondary to artifact No significant change since last tracing Confirmed by Zadie Rhine ([id number]) on 04/18/2023 4:32:54 AM  I have reviewed the labs performed to date as well as medications administered while in observation.  Recent changes in the last 24 hours include no new changes.  Patient awaiting IP bed.  Positive THC.  Positive CK that is downtrending.  High sensitive troponin are reassuring.  Plan  Current plan is for IP admission. Patient has been tachycardic in the ED, but he denies shortness of breath and there is no hypoxia, respiratory distress.    Derwood Kaplan, MD 04/19/23 1247

## 2023-04-19 NOTE — Tx Team (Signed)
Initial Treatment Plan 04/19/2023 5:13 PM Matthew Padilla [id number]    PATIENT STRESSORS: Health problems   Medication change or noncompliance   Substance abuse     PATIENT STRENGTHS: Supportive family/friends    PATIENT IDENTIFIED PROBLEMS: Disorganized  Auditory hallucination  Medication noncompliance  Anxiety  Paranoia             DISCHARGE CRITERIA:  Adequate post-discharge living arrangements Safe-care adequate arrangements made  PRELIMINARY DISCHARGE PLAN: Attend aftercare/continuing care group Outpatient therapy Return to previous living arrangement  PATIENT/FAMILY INVOLVEMENT: This treatment plan has been presented to and reviewed with the patient, Matthew Padilla, and/or family member.  The patient and family have been given the opportunity to ask questions and make suggestions.  [name], RN 04/19/2023, 5:13 PM

## 2023-04-19 NOTE — ED Notes (Signed)
Patient's mother and father showed up a [facility] requesting information on patient's whereabouts; Family states he may/or may not have legal guardian and they should have been notified for release; There  is not Legal Guardian info on file; NT in lobby told by this RN to explain to family that since there is no documentation we can not release if or where the patient is at this time; It appears that patient was under IVC and released to [facility]. Parents only told that patient has been transferred from [facility] but no further info given-Monique,RN

## 2023-04-19 NOTE — ED Notes (Signed)
Patient still awake and walking around.

## 2023-04-19 NOTE — Progress Notes (Signed)
Admission Note: Patient is a 27 year old male admitted to the unit from [facility] for medication noncompliance, auditory hallucination, manic, bizarre and paranoid behaviors.  Patient is alert and oriented to person and place.  Appears disorganized with tangential thoughts process.  Patient observed talking to himself while looking around the room.  Stated he stop taking his medications because they are not working for him.  Admission plan of care reviewed, consent signed.  Skin assessment and personal belongings completed.  Skin is dry and intact.  No contraband found.  Patient oriented to the unit, staff and room.  Routine safety checks initiated.  Patient observed jumping on the chair, intrusive, taking off his clothes, pacing the hallway with bizarre hand gestures while talking to himself.  Unable to follow verbal redirection.  Agitation protocol given.  Staff will continue to monitor patient for safety.

## 2023-04-20 MED ORDER — ZIPRASIDONE MESYLATE 20 MG IM SOLR: 10.0000 mg | Freq: Four times a day (QID) | INTRAMUSCULAR | Status: DC | PRN

## 2023-04-20 MED ORDER — PROPRANOLOL HCL 40 MG PO TABS
40.0000 mg | ORAL_TABLET | Freq: Three times a day (TID) | ORAL | Status: DC
  Administered 2023-04-20 – 2023-04-23 (×9): 40 mg via ORAL
  Filled 2023-04-20 (×6): qty 1
  Filled 2023-04-20: qty 4
  Filled 2023-04-20: qty 1
  Filled 2023-04-20: qty 4
  Filled 2023-04-20 (×5): qty 1

## 2023-04-20 MED ORDER — RISPERIDONE 2 MG PO TBDP
2.0000 mg | ORAL_TABLET | Freq: Two times a day (BID) | ORAL | Status: DC
  Administered 2023-04-20 – 2023-04-21 (×2): 2 mg via ORAL
  Filled 2023-04-20 (×7): qty 1

## 2023-04-20 NOTE — H&P (Signed)
Psychiatric Admission Assessment Adult  Patient Identification: Matthew Padilla MRN:  [id number] Date of Evaluation:  04/20/2023 Chief Complaint:  Schizophrenia (HCC) [F20.9] Principal Diagnosis: Schizophrenia (HCC) Diagnosis:  Principal Problem:   Schizophrenia (HCC)   History of Present Illness: Matthew Padilla is a 75 y M with past psychiatric history of schizophrenia, paranoid type and past medical history of hypogonadism. He presented to [facility] ED for tachycardia and found to have mild rhabdomyolysis. Patient was medically stabilized and admitted to inpatient [facility] under IVC for possible exacerbation of schizophrenia.  On evaluation this AM, patient is acutely restless with disorganized and scattered thought process and thought content. Patient is frequently distracted during the interview and poses questions to the interviewing team. He asked, for example "What would happen if that wall fell and a lighter came out?" He had to be redirected several times with many inconsistencies in his narrative. Patient is oriented to person and place. He does not know the day or month, but aware of the year. He does not know the current president.   He states it is possible using marijuana or mushrooms may have precipitated his arrival to the ED. Patient notes he uses marijuana and mushrooms once a year. When asked earlier by Dr. Marval Regal, he stated alcohol use had precipitated the tachycardia with about one drink per day. He denies any other substance use.  Patient reports he has experienced ongoing paranoid delusions and AVH for unspecified amount of time prior to current admission. He reports occasionally seeing colors and the number "507" (current room number) when asked for further details. He states the voices in his head are now telling him to "get out of here." Patient occasionally paces around the room and seems to be responding to internal stimuli that compel him to walk. He denies persecutory  delusions or SI/HI at the current admission. However he states he may have had SI/HI in the past with unspecified duration/frequency, although he notes he has not acted on these thoughts. Patient is ambivalent about intentionally engaging in self-harm behaviors in the past including cutting and burning.   Patient accurately states the name of his outpatient psychiatrist Dr. Kathryne Sharper. He notes his last visit was several months ago although on chart review his last virtual follow up was on 7/3. At the time, patient was reportedly taking Zoloft 50 mg and Risperdal 1 mg as per Dr. Clarise Cruz note, but patient denies taking all medications for the last several months as they were not working for him. Patient received agitation protocol with haldol yesterday morning and Risperdal this morning. He has had 2 prior psychiatric hospitalizations in the past, with the most recent in 2022 for bizarre behavior and psychosis.  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 He reports he does not have access to firearms. He reports frequently wandering in the woods and says he "figured it out" when asked about this further.  On medical ROS, patient denies CP/SOB, N/V, muscle pain or cramping.  Collateral information from [name] (patient's mother) [phone] 04/20/23 Patient's mother contacted this afternoon. She reports patient received schizophrenia diagnosis in 2022 after psychiatric hospitalization for psychosis. Patient had been adherent to Risperdal and Zoloft until about March 2024 after which he had not been taking them regularly (about 3x a week). Mother notes he increased mushroom and THC usage around the same time to about 2x a week with unspecified quantity. He had been doing well this past year  in terms of mood but mother states he was increasingly irritable and had several anger outbursts over minor provocation during the last few months. She believes he  significantly increased substance use to nearly every day in the week preceding his admission. She administered Zoloft and Risperdal to patient last Saturday. Mother reports patient experiences seasonal psychosis usually in July and August, with depressive symptoms into the fall.   Patient living with parents and had been working at a [employer] since May. Coworkers had also noted change in patient's usual bright mood/affect to increasingly withdrawn and irritable. He has expressed passive SI in the past, mom notes he has not acted on these thoughts before.  Associated Signs/Symptoms: Depression Symptoms:  insomnia, psychomotor agitation, difficulty concentrating, impaired memory, disturbed sleep, (Hypo) Manic Symptoms:  Delusions, Distractibility, Elevated Mood, Hallucinations, Psychotic Symptoms:  Delusions, Hallucinations: Auditory Command:  telling him to go walking around and also to get out of here Visual Paranoia, PTSD Symptoms: Hypervigilance:  Yes Hyperarousal:  Difficulty Concentrating Emotional Numbness/Detachment Irritability/Anger Total Time spent with patient: 30 minutes  Past Psychiatric History: schizophrenia, paranoid type  Is the patient at risk to self? Yes.    Has the patient been a risk to self in the past 6 months? Yes.    Has the patient been a risk to self within the distant past? Yes.    Is the patient a risk to others? Yes.    Has the patient been a risk to others in the past 6 months? No.  Has the patient been a risk to others within the distant past? No.   [place] Scale:  Flowsheet Row Ad[facility] ([facility]) [facility] from 04/18/2023 in [facility] at [facility] Counselor from 02/15/2023 in [facility] at [facility] RISK CATEGORY No Risk No Risk Error: Question 1 not populated        Prior Inpatient Therapy: Yes.    Psychiatric hospitalization in 2022 for psychosis. Prior Outpatient Therapy: Yes.   Psychiatric follow-up with Dr. Kathi Ludwig  Alcohol Screening:  Patient refused Alcohol Screening Tool: Yes 1. How often do you have a drink containing alcohol?: Never 2. How many drinks containing alcohol do you have on a typical day when you are drinking?: 1 or 2 3. How often do you have six or more drinks on one occasion?: Never AUDIT-C Score: 0 Alcohol Brief Interventions/Follow-up: Alcohol education/Brief advice Substance Abuse History in the last 12 months:  Yes.   Consequences of Substance Abuse: NA Previous Psychotropic Medications: Yes  Psychological Evaluations: Yes  Past Medical History:  Past Medical History:  Diagnosis Date   Anxiety    Low testosterone in male    Paranoid schizophrenia (HCC)     History reviewed. No pertinent surgical history. Family History: History reviewed. No pertinent family history. Family Psychiatric  History: Mother reports fmhx of bipolar disorder in patient's aunt well-controlled on lithium. Tobacco Screening:  Social History   Tobacco Use  Smoking Status Never  Smokeless Tobacco Never    BH Tobacco Counseling     Are you interested in Tobacco Cessation Medications?  Yes, implement Nicotene Replacement Protocol Counseled patient on smoking cessation:  Yes Reason Tobacco Screening Not Completed: Patient Refused Screening       Social History:  Social History   Substance and Sexual Activity  Alcohol Use Not Currently     Social History   Substance and Sexual Activity  Drug Use Yes   Types: Other-see comments    Additional Social History:                           Allergies:   No Known Allergies Lab Results:  Results for orders placed or performed during the hospital encounter of 04/18/23 (from the past 48 hour(s))  CK     Status: Abnormal   Collection Time: 04/18/23 10:54 PM  Result Value Ref Range   Total CK 1,183 (H) 49 - 397  U/L    Comment: Performed at [facility], [address], [place], [place] [address]    Blood Alcohol level:  Lab Results  Component Value Date   [facility] <10 04/18/2023   ETH <10 07/08/2021    Metabolic Disorder Labs:  Lab Results  Component Value Date   HGBA1C 5.5 06/11/2022   Lab Results  Component Value Date   PROLACTIN 22.3 (H) 09/11/2022   No results found for: "CHOL", "TRIG", "HDL", "CHOLHDL", "VLDL", "LDLCALC"  Current Medications: Current Facility-Administered Medications  Medication Dose Route Frequency Provider Last Rate Last Admin   acetaminophen (TYLENOL) tablet 650 mg  650 mg Oral Q6H PRN Eligha Bridegroom, NP       alum & mag hydroxide-simeth (MAALOX/MYLANTA) 200-200-20 MG/5ML suspension 30 mL  30 mL Oral Q4H PRN Eligha Bridegroom, NP       diphenhydrAMINE (BENADRYL) capsule 50 mg  50 mg Oral TID PRN Eligha Bridegroom, NP   50 mg at 04/20/23 0129   Or   diphenhydrAMINE (BENADRYL) injection 50 mg  50 mg Intramuscular TID PRN Eligha Bridegroom, NP   50 mg at 04/19/23 1706   haloperidol (HALDOL) tablet 5 mg  5 mg Oral Q8H PRN Starleen Blue, NP   5 mg at 04/20/23 0129   Or   haloperidol lactate (HALDOL) injection 5 mg  5 mg Intramuscular Q8H PRN Starleen Blue, NP   5 mg at 04/19/23 1705   LORazepam (ATIVAN) tablet 2 mg  2 mg Oral TID PRN Eligha Bridegroom, NP   2 mg at 04/20/23 0129   Or   LORazepam (ATIVAN) injection 2 mg  2 mg Intramuscular TID PRN Eligha Bridegroom, NP   2 mg at 04/19/23 1706   magnesium hydroxide (MILK OF MAGNESIA) suspension 30 mL  30 mL Oral Daily PRN Eligha Bridegroom, NP       risperiDONE (RISPERDAL M-TABS) disintegrating tablet 1 mg  1 mg Oral BID Starleen Blue, NP   1 mg at 04/20/23 0820   PTA Medications: Medications Prior to Admission  Medication Sig Dispense Refill Last Dose   acetaminophen (TYLENOL) 500 MG tablet Take 500 mg by mouth every 6 (six) hours as needed for moderate pain.      clomiPHENE (CLOMID) 50 MG tablet Take 1 tablet  (50 mg total) by mouth daily. 30 tablet 6    ibuprofen (ADVIL) 200 MG tablet Take 200-400 mg by mouth every 6 (six) hours as needed for moderate pain.      risperiDONE (RISPERDAL) 1 MG tablet TAKE 1 TABLET(1 MG) BY MOUTH AT BEDTIME 30 tablet 2    sertraline (ZOLOFT) 50 MG tablet Take 0.5-1 tablets (25-50 mg total) by mouth daily. (Patient not taking: Reported on 04/18/2023) 30 tablet 2     Musculoskeletal: Strength & Muscle Tone: within normal limits Gait & Station: normal Patient leans: N/A  Psychiatric Specialty Exam:  Presentation  General Appearance:  Fairly Groomed   Eye Contact:  Fair   Speech: Pressured   Speech Volume: Normal   Handedness: Right    Mood and Affect  Mood: Irritable; Labile   Affect: Congruent    Thought Process  Thought Processes: Disorganized   Duration of Psychotic Symptoms:N/A Past Diagnosis of Schizophrenia or Psychoactive disorder: Yes   Descriptions of Associations:Tangential   Orientation:Full (Time, Place and Person)   Thought Content:Paranoid Ideation; Tangential; Illogical   Hallucinations:Hallucinations: None   Ideas of Reference:Paranoia   Suicidal Thoughts:Suicidal Thoughts: No   Homicidal Thoughts:Homicidal Thoughts: No   Sensorium  Memory: Immediate Fair; Recent Fair   Judgment: Impaired   Insight: Lacking    Executive Functions  Concentration: Fair   Attention Span: Fair   Recall: Fair   Fund of Knowledge: Fair   Language: Fair    Psychomotor Activity  Psychomotor Activity: Psychomotor Activity: Restlessness    Assets  Assets: Desire for Improvement; Physical Health; Social Support    Sleep  Sleep: Sleep: Poor   Physical Exam: Vitals and nursing note reviewed.  Constitutional:      General: He is in acute distress.     Appearance: He is obese.  HENT:     Head: Normocephalic and atraumatic.  Pulmonary:     Effort: Pulmonary effort is normal.   Skin:    General: Skin is warm.  Neurological:     Mental Status: He is disoriented.      Review of Systems  Reason unable to perform ROS: Acutely psychotic.  Constitutional:  Positive for diaphoresis. Negative for chills and fever.  Respiratory:  Negative for cough.   Cardiovascular:  Negative for chest pain.  Gastrointestinal:  Negative for abdominal pain, constipation, diarrhea, nausea and vomiting.  Neurological:  Positive for sensory change.  Psychiatric/Behavioral:  Positive for hallucinations.     Blood pressure (!) 145/72, pulse (!) 124, temperature 98.3 F (36.8 C), temperature source Oral, resp. rate 18, height 6\' 5"  (1.956 m), weight (!) 171 kg, SpO2 99%. Body mass index is 44.71 kg/m.     COGNITIVE FEATURES THAT CONTRIBUTE TO RISK:  Loss of executive function    Treatment Plan Summary: Daily contact with patient to assess and evaluate symptoms and progress in treatment and Medication management  Assessment:  Matthew Padilla is a 80 y M with past psychiatric history of schizophrenia, paranoid type and past medical history of hypogonadism. He presented to [facility] ED for tachycardia and found to have mild rhabdomyolysis. Patient was medically stabilized and admitted to inpatient [facility] under IVC for possible exacerbation of schizophrenia.   Patient accurately states the name of his outpatient psychiatrist Dr. Kathryne Sharper. He notes his last visit was several months ago although on chart review his last virtual follow up was on 7/3. At the time, patient was reportedly taking Zoloft 50 mg and Risperdal 1 mg as per Dr. Clarise Cruz note, but patient denies taking all medications for the last several months as they were not working for him. He has had 2 prior psychiatric hospitalizations in the past, with the most recent in 2022 for bizarre behavior and psychosis.   Pt on exam this morning was acutely restless with disorganized and scattered thought processes and thought content.  Patient is frequently distracted during the interview and poses questions to the interviewing team. He asked, for example "What would happen if that wall fell and a lighter came out?" He had to be redirected several times with many inconsistencies in his narrative. Collateral obtained from his mother indicates that the patient  may be doing any number of illicit substances in unknown quantities including more THC and psychotropic mushrooms than the patient reports.  Events / PRNs administered for acute agitation: -- 7/15 1706 PM Haldol 5 mg / diphenhydramine 50 mg /ativan 2mg  IM -- 7/16 0129 AM Haldol 5 mg / diphenhydramine 50 mg /ativan 2mg  Oral  -- 7/16 1201 PM Haldol 5 mg / diphenhydramine 50 mg /ativan 2mg  IM -- 7/16 1336 PM At this time, ordered ziprasidone 10 mg Injections to be his PRN medication going forward  -- 7/16 1336 PM EKG ordered to ensure patient safety  Diagnoses / Active Problems: Current differential includes: schizophrenia (previous diagnosis), schizoaffective disorder (patient has seasonal mood changes and psychosis, sometimes together, sometimes apart), substance-induced psychosis (mushrooms and THC), or an organic cause of psychosis.  For the moment, the treatment remains the same regardless of the etiology. Safety for patient and team, medications to reduce his agitation until he can be calm enough for verbal redirections.  PLAN OF CARE:  Safety and Monitoring:  -- Involuntary admission to inpatient psychiatric unit for safety, stabilization and treatment  -- Daily contact with patient to assess and evaluate symptoms and progress in treatment  -- Patient's case to be discussed in multi-disciplinary team meeting  -- Observation Level : Continuous, safety checks every 15 minutes/  -- Vital signs:  q6 hours  -- Precautions: suicide, elopement, and assault  2. Psychiatric Diagnoses and Treatment:   -- Start Risperdal m-tabs, 2 mg oral dissolvable tablets BID for acute  psychosis  -- Propranolol 40 mg TID for tachycardia, blood pressure, anxiolysis.  -- PRN diphenhydramine 50 mg / lorazepam 2 mg / ziprasidone 10 mg  --  The risks/benefits/side-effects/alternatives to this medication were discussed in detail with the patient and time was given for questions. The patient consents to medication trial.   -- Metabolic profile and EKG monitoring obtained while on an atypical antipsychotic (BMI: 44.71 Lipid Panel:  HbgA1c: QTc:)   -- Encouraged patient to participate in unit milieu and in scheduled group therapies   -- Short Term Goals: Ability to demonstrate self-control will improve and Ability to maintain clinical measurements within normal limits will improve  -- Long Term Goals: Improvement in symptoms so as ready for discharge    3. Medical Issues Being Addressed:   Labs reviewed, notable for recovering rhabdomyolysis, mild troponin leak, elevated AST, positive for THC.  -- Ordered repeat EKG 7/16  4. Discharge Planning:   -- Social work and case management to assist with discharge planning and identification of hospital follow-up needs prior to discharge  -- Estimated LOS: 5-7 days  -- Discharge Concerns: Need to establish a safety plan; Medication compliance and effectiveness  -- Discharge Goals: Return home with outpatient referrals for mental health follow-up including medication management/psychotherapy  I certify that inpatient services furnished can reasonably be expected to improve the patient's condition.    Signed: Christie Nottingham, MD [facility] Physician 04/20/2023 3:57 PM

## 2023-04-20 NOTE — Plan of Care (Signed)
  Problem: Education: Goal: Knowledge of [facility] Education information/materials will improve Outcome: Progressing Goal: Emotional status will improve Outcome: Progressing Goal: Mental status will improve Outcome: Progressing   Problem: Activity: Goal: Interest or engagement in activities will improve Outcome: Progressing   

## 2023-04-20 NOTE — Group Note (Signed)
Date:  04/20/2023 Time:  10:14 AM  Group Topic/Focus:  Goals Group:   The focus of this group is to help patients establish daily goals to achieve during treatment and discuss how the patient can incorporate goal setting into their daily lives to aide in recovery. Orientation:   The focus of this group is to educate the patient on the purpose and policies of crisis stabilization and provide a format to answer questions about their admission.  The group details unit policies and expectations of patients while admitted.     Participation Level:  None  Participation Quality:  Inattentive  Affect:  Resistant  Cognitive:  Hallucinating  Insight: None  Engagement in Group:  Poor  Modes of Intervention:  Discussion  Deforest Hoyles Ardelia Wrede 04/20/2023, 10:14 AM

## 2023-04-20 NOTE — Progress Notes (Signed)
Pt awake, pacing hall, confused , disorganized and attempting to go into peers' rooms. Continues to need multiple verbal redirections. 1:1 observation maintained with assigned staff in attendance. Pt tolerated meals and fluids well.

## 2023-04-20 NOTE — [facility] Counselor (Signed)
[facility] LCSW Progress Note   04/20/2023    2:16 PM  Matthew Padilla      Type of Note: 1 x Assessment attempt   CSW was heading to see patient for assessment and made aware that he was agitated and in seclusion room. CSW will try tomorrow with assessment     Signed:   Jacob Moores, MSW, [facility] 04/20/2023 2:16 PM

## 2023-04-20 NOTE — Progress Notes (Signed)
1:1 Note Pt has been placed on 1:1 observation for safety due to confusion, wondering into other pts rooms. Pt is also observed to unstable on his feet hence high fall risk. Pt has been kept safe at this time, will continue to monitor.

## 2023-04-20 NOTE — Progress Notes (Signed)
   04/20/23 2010  Psych Admission Type (Psych Patients Only)  Admission Status Involuntary  Psychosocial Assessment  Patient Complaints Anxiety;Agitation  Eye Contact Fair  Facial Expression Anxious;Grimacing  Affect Preoccupied  Speech Argumentative  Interaction Defensive  Motor Activity Ritualistic  Appearance/Hygiene Unremarkable  Behavior Characteristics Agitated;Anxious  Mood Irritable  Thought Process  Coherency Disorganized  Content Preoccupation  Delusions None reported or observed  Perception Hallucinations  Hallucination Auditory  Judgment Impaired  Confusion Moderate  Danger to Self  Current suicidal ideation? Denies  Danger to Others  Danger to Others None reported or observed

## 2023-04-20 NOTE — Progress Notes (Addendum)
Pt woke up confused, wondering into other pts rooms. Pt observed attempting to pull exits signs off the ceiling. Pt refuses to stay in his room, back and forth on the hallway. Pt requires lots of redirections which seems not to be working. Staff had to stay with the pt to keep him from wondering into other pts rooms  and to prevent pt from falling. Pt medicated with agitation protocol, but still could not lay down to go sleep. V/S monitored, will continue to monitor.

## 2023-04-20 NOTE — Progress Notes (Addendum)
Pt received PRN agitation protocol at approximately 1155. He was slamming doors, running into the wall in his room and yelling. Unable to verbally redirect at the time time. He then punched a hole in the wall post agitation protocol at approximately 1210. Now in padded room. 1:1 observation maintained. Assigned MHT in attendance at all times. Pt awake, punching padded walls. Assigned provider made aware. No new orders given at this time. Support, encouragement and reassurance offered.

## 2023-04-20 NOTE — Progress Notes (Addendum)
Pt wake pacing dayroom and hallway, going to entry door attempting to open it. Restless, fidgety and intrusive on interactions. Noted with blank stares, walking into peer's faces / personal spaces and staring at them. Requires multiple verbal redirections thus far this AM. Compliant with medications when offered. Denies adverse drug reactions when assessed. Reports he slept well with good appetite, but per nursing report, he was awake at 0100 and disruptive in milieu. Agitation protocol administered at 0129. Pt denies SI, HI, AVH and pain, however, he remains disorganized with avertive eye contact, nonsensical speech and bizarre hand gestures. 1:1 observation maintained with assigned staff in attendance at all times. Pt requires multiple redirections to comply with care and unit routines.

## 2023-04-20 NOTE — Group Note (Signed)
Recreation Therapy Group Note   Group Topic:Coping Skills  Group Date: 04/20/2023 Start Time: 1048 End Time: 1110 Facilitators: Schwanda Zima-McCall, LRT,CTRS Location: 500 Hall Dayroom   Goal Area(s) Addresses: Patient will define what a coping skill is. Patient will successfully identify positive coping skills they can use post d/c.  Patient will acknowledge benefit(s) of using learned coping skills post d/c.    Group Description: Coping A to Z. Patient asked to identify what a coping skill is and when they use them. Patients with Clinical research associate discussed healthy versus unhealthy coping skills. Next patients were given a blank worksheet titled "Coping Skills A-Z". Patients were instructed to come up with at least one positive coping skill per letter of the alphabet.  Patients were given 15 minutes to brainstorm, before ideas were presented to the large group. Patients and LRT debriefed on the importance of coping skill selection based on situation and back-up plans when a skill tried is not effective. At the end of group, patients were given an handout of alphabetized strategies to keep for future reference.   Affect/Mood: Anxious   Participation Level: Minimal   Participation Quality: Moderate Cues   Behavior: Off-task   Speech/Thought Process: Delusional   Insight: Lacking   Judgement: Lacking    Modes of Intervention: Worksheet   Patient Response to Interventions:  Avoidant   Education Outcome:  In group clarification offered    Clinical Observations/Individualized Feedback: Pt was able to identify breathing and music as coping skills. Through the remainder of group, pt was in his own world, limited engagement, needed redirection throughout group and couldn't sit still.    Plan: Continue to engage patient in RT group sessions 2-3x/week.   Frida Wahlstrom-McCall, LRT,CTRS 04/20/2023 12:00 PM

## 2023-04-20 NOTE — [facility] Suicide Risk Assessment (Signed)
Suicide Risk Assessment  Admission Assessment    [facility] Admission Suicide Risk Assessment   Nursing information obtained from:  Patient Demographic factors:  Adolescent or young adult, Male Current Mental Status:  NA Loss Factors:  NA Historical Factors:  NA Risk Reduction Factors:  Living with another person, especially a relative  Total Time spent with patient: 30 minutes Principal Problem: Schizophrenia (HCC) Diagnosis:  Principal Problem:   Schizophrenia (HCC)  Subjective Data: imothy Sar is a 14 y M with past psychiatric history of schizophrenia, paranoid type and past medical history of hypogonadism. He presented to [facility] ED for tachycardia and found to have mild rhabdomyolysis. Patient was medically stabilized and admitted to inpatient [facility] under IVC for possible exacerbation of schizophrenia.   On evaluation this AM, patient is acutely restless with disorganized and scattered thought process and thought content. Patient is frequently distracted during the interview and poses questions to the interviewing team. He asked, for example "What would happen if that wall fell and a lighter came out?" He had to be redirected several times with many inconsistencies in his narrative. Patient is oriented to person and place. He does not know the day or month, but aware of the year. He does not know the current president.   Collateral information from Olly Shiner (patient's mother) [phone] 04/20/23 Patient's mother contacted this afternoon. She reports patient received schizophrenia diagnosis in 2022 after psychiatric hospitalization for psychosis. Patient had been adherent to Risperdal and Zoloft until about March 2024 after which he had not been taking them regularly (about 3x a week). Mother notes he increased mushroom and THC usage around the same time to about 2x a week with unspecified quantity. He had been doing well this past year in terms of mood but mother states he was  increasingly irritable and had several anger outbursts over minor provocation during the last few months. She believes he significantly increased substance use to nearly every day in the week preceding his admission. She administered Zoloft and Risperdal to patient last Saturday. Mother reports patient experiences seasonal psychosis usually in July and August, with depressive symptoms into the fall.   Continued Clinical Symptoms:   Depression Symptoms:  insomnia, psychomotor agitation, difficulty concentrating, impaired memory, disturbed sleep, (Hypo) Manic Symptoms:  Delusions, Distractibility, Elevated Mood, Hallucinations, Psychotic Symptoms:  Delusions, Hallucinations: Auditory Command:  telling him to go walk and get out of here Visual Ideas of Reference, Paranoia, PTSD Symptoms: Hypervigilance:  Yes Hyperarousal:  Difficulty Concentrating Emotional Numbness/Detachment Irritability/Anger  The "Alcohol Use Disorders Identification Test", Guidelines for Use in Primary Care, Second Edition.  World Science writer [facility]). Score between 0-7:  no or low risk or alcohol related problems. Score between 8-15:  moderate risk of alcohol related problems. Score between 16-19:  high risk of alcohol related problems. Score 20 or above:  warrants further diagnostic evaluation for alcohol dependence and treatment.   CLINICAL FACTORS:   Severe Anxiety and/or Agitation Alcohol/Substance Abuse/Dependencies Schizophrenia:   Command hallucinatons Currently Psychotic Previous Psychiatric Diagnoses and Treatments   Musculoskeletal: Strength & Muscle Tone: within normal limits Gait & Station: normal Patient leans: N/A  Psychiatric Specialty Exam:  Presentation  General Appearance:  Fairly Groomed  Eye Contact: Minimal  Speech: Normal Rate; Clear and Coherent  Speech Volume: Normal  Handedness: Right   Mood and Affect  Mood: Euthymic  Affect: Inappropriate;  Non-Congruent; Constricted   Thought Process  Thought Processes: Disorganized; Irrevelant  Descriptions of Associations:Tangential  Orientation:Partial (oriented to place and  person, aware of year (but not day or month))  Thought Content:Delusions; Paranoid Ideation; Illogical; Tangential; Scattered  History of Schizophrenia/Schizoaffective disorder:Yes  Duration of Psychotic Symptoms:Greater than six months  Hallucinations:Hallucinations: Auditory; Command Description of Command Hallucinations: telling him to walk around the room  Ideas of Reference:Paranoia  Suicidal Thoughts:Suicidal Thoughts: No  Homicidal Thoughts:Homicidal Thoughts: No   Sensorium  Memory: Recent Poor; Immediate Poor; Remote Poor  Judgment: Impaired  Insight: Shallow   Executive Functions  Concentration: Poor  Attention Span: Poor  Recall: Poor  Fund of Knowledge: Poor  Language: Good   Psychomotor Activity  Psychomotor Activity: Psychomotor Activity: Restlessness AIMS Completed?: No   Assets  Assets: Social Support; Housing; Health and safety inspector; Physical Health   Sleep  Sleep: Sleep: Poor    Physical Exam: Physical Exam Vitals and nursing note reviewed.  Constitutional:      General: He is in acute distress.     Appearance: He is obese.  HENT:     Head: Normocephalic and atraumatic.  Pulmonary:     Effort: Pulmonary effort is normal.  Skin:    General: Skin is warm.  Neurological:     Mental Status: He is disoriented.    ROS Blood pressure (!) 145/72, pulse (!) 124, temperature 98.3 F (36.8 C), temperature source Oral, resp. rate 18, height 6\' 5"  (1.956 m), weight (!) 171 kg, SpO2 99%. Body mass index is 44.71 kg/m.   COGNITIVE FEATURES THAT CONTRIBUTE TO RISK:  Loss of executive function    SUICIDE RISK:   Moderate:  Frequent suicidal ideation with limited intensity, and duration, some specificity in terms of plans, no associated  intent, good self-control, limited dysphoria/symptomatology, some risk factors present, and identifiable protective factors, including available and accessible social support.  Assessment and Plan Assessment:  Addam Goeller is a 63 y M with past psychiatric history of schizophrenia, paranoid type and past medical history of hypogonadism. He presented to [facility] ED for tachycardia and found to have mild rhabdomyolysis. Patient was medically stabilized and admitted to inpatient [facility] under IVC for possible exacerbation of schizophrenia.   Patient accurately states the name of his outpatient psychiatrist Dr. Kathryne Sharper. He notes his last visit was several months ago although on chart review his last virtual follow up was on 7/3. At the time, patient was reportedly taking Zoloft 50 mg and Risperdal 1 mg as per Dr. Clarise Cruz note, but patient denies taking all medications for the last several months as they were not working for him. He has had 2 prior psychiatric hospitalizations in the past, with the most recent in 2022 for bizarre behavior and psychosis.   Pt on exam this morning was acutely restless with disorganized and scattered thought process and thought content. Patient is frequently distracted during the interview and poses questions to the interviewing team. He asked, for example "What would happen if that wall fell and a lighter came out?" He had to be redirected several times with many inconsistencies in his narrative. Collateral obtained from his mother indicates that the patient may be doing any number of illicit substances in unknown quantities including more THC and psychotropic mushrooms than the patient reports.  Diagnoses / Active Problems: Current differential includes: schizophrenia (previous diagnosis), schizoaffective disorder (patient has seasonal mood changes and psychosis, sometimes together, sometimes apart), substance-induced psychosis (mushrooms and THC), or an organic cause of  psychosis. For the moment, the treatment remains the same regardless of the etiology. Safety for patient and team, medications to reduce his  agitation until he can be calm enough for verbal redirections.  PLAN OF CARE:   Safety and Monitoring:  -- Involuntary admission to inpatient psychiatric unit for safety, stabilization and treatment  -- Daily contact with patient to assess and evaluate symptoms and progress in treatment  -- Patient's case to be discussed in multi-disciplinary team meeting  -- Observation Level : Continuous, safety checks every 15 minutes/  -- Vital signs:  q6 hours  -- Precautions: suicide, elopement, and assault  2. Psychiatric Diagnoses and Treatment:   -- Start Risperdal m-tabs, 2 mg oral dissolvable tablets BID for acute psychosis  -- Propranolol 40 mg TID for tachycardia, blood pressure, anxiolysis.  -- PRN diphenhydramine 50 mg / lorazepam  --  The risks/benefits/side-effects/alternatives to this medication were discussed in detail with the patient and time was given for questions. The patient consents to medication trial.   -- Metabolic profile and EKG monitoring obtained while on an atypical antipsychotic (BMI: 44.71 Lipid Panel:  HbgA1c: QTc:)   -- Encouraged patient to participate in unit milieu and in scheduled group therapies   -- Short Term Goals: Ability to demonstrate self-control will improve and Ability to maintain clinical measurements within normal limits will improve  -- Long Term Goals: Improvement in symptoms so as ready for discharge    3. Medical Issues Being Addressed:   Labs reviewed, notable for recovering rhabdomyolysis, mild troponin leak, elevated AST, positive for THC.  Tobacco Use Disorder  -- Nicotine patch 21mg /24 hours ordered  -- Smoking cessation encouraged  4. Discharge Planning:   -- Social work and case management to assist with discharge planning and identification of hospital follow-up needs prior to discharge  --  Estimated LOS: 5-7 days  -- Discharge Concerns: Need to establish a safety plan; Medication compliance and effectiveness  -- Discharge Goals: Return home with outpatient referrals for mental health follow-up including medication management/psychotherapy  I certify that inpatient services furnished can reasonably be expected to improve the patient's condition.   Margaretmary Dys, MD 04/20/2023, 11:56 AM

## 2023-04-20 NOTE — Progress Notes (Signed)
At 2018 pm  patient  is awake and oriented to self, noted pacing, from dayroom  toward his  room and hallway. Patient expressed  anxiety and got irritable and agitated  stating he was tired of  the  frequent verbal redirections from staff. Agitation protocol administered per Mar. 1:1 observation in progress. Patient is currently in his room asleep. Will continue to monitor and offer support as needed.

## 2023-04-20 NOTE — Plan of Care (Signed)
  Problem: Education: Goal: Emotional status will improve Outcome: Progressing   Problem: Activity: Goal: Interest or engagement in activities will improve Outcome: Progressing   Problem: Coping: Goal: Ability to verbalize frustrations and anger appropriately will improve Outcome: Progressing   Problem: Safety: Goal: Periods of time without injury will increase Outcome: Progressing   Problem: Education: Goal: Emotional status will improve Outcome: Progressing

## 2023-04-20 NOTE — Progress Notes (Signed)
   04/19/23 2300  Psych Admission Type (Psych Patients Only)  Admission Status Involuntary  Psychosocial Assessment  Patient Complaints Anxiety  Eye Contact Fair  Facial Expression Anxious  Affect Preoccupied  Speech Pressured  Interaction Cautious  Motor Activity Slow  Appearance/Hygiene In scrubs  Behavior Characteristics Cooperative;Appropriate to situation  Mood Preoccupied  Thought Process  Coherency WDL  Content WDL  Delusions None reported or observed  Perception Hallucinations  Hallucination Auditory  Judgment Impaired  Confusion None  Danger to Self  Current suicidal ideation? Denies  Danger to Others  Danger to Others None reported or observed

## 2023-04-20 NOTE — Group Note (Signed)
Date:  04/20/2023 Time:  8:29 PM  Group Topic/Focus:  Wrap-Up Group:   The focus of this group is to help patients review their daily goal of treatment and discuss progress on daily workbooks.    Participation Level:  Active  Participation Quality:  Appropriate and Sharing  Affect:  Appropriate  Cognitive:  Appropriate  Insight: Appropriate  Engagement in Group:  Engaged  Modes of Intervention:  Discussion  Additional Comments:  Patient stated that he had a good day. The patient stated that the goal for today and tomorrow is to have a good nights rest.   Matthew Padilla 04/20/2023, 8:29 PM

## 2023-04-20 NOTE — Progress Notes (Signed)
Pt noted pacing, walking backwards from quiet room after punching wall in his room. Continues to need frequent verbal redirections. Currently asleep in quiet room at this time, respirations noted and unlabored. 1:1 observation maintained with assigned staff in attendance at all times.

## 2023-04-21 ENCOUNTER — Encounter (HOSPITAL_COMMUNITY): Payer: Self-pay

## 2023-04-21 DIAGNOSIS — F203 Undifferentiated schizophrenia: Secondary | ICD-10-CM

## 2023-04-21 MED ORDER — RISPERIDONE 3 MG PO TBDP
3.0000 mg | ORAL_TABLET | Freq: Two times a day (BID) | ORAL | Status: DC
  Administered 2023-04-21 – 2023-04-23 (×4): 3 mg via ORAL
  Filled 2023-04-21 (×7): qty 1

## 2023-04-21 MED ORDER — OLANZAPINE 10 MG IM SOLR
5.0000 mg | Freq: Three times a day (TID) | INTRAMUSCULAR | Status: DC | PRN
  Administered 2023-04-21: 5 mg via INTRAMUSCULAR
  Filled 2023-04-21: qty 10

## 2023-04-21 MED ORDER — TRAZODONE HCL 50 MG PO TABS
50.0000 mg | ORAL_TABLET | Freq: Every evening | ORAL | Status: DC | PRN
  Administered 2023-04-21: 50 mg via ORAL
  Filled 2023-04-21: qty 1

## 2023-04-21 MED ORDER — LORAZEPAM 1 MG PO TABS
2.0000 mg | ORAL_TABLET | Freq: Three times a day (TID) | ORAL | Status: DC | PRN
  Administered 2023-04-21: 2 mg via ORAL
  Filled 2023-04-21: qty 2

## 2023-04-21 MED ORDER — DIPHENHYDRAMINE HCL 25 MG PO CAPS: 50.0000 mg | ORAL_CAPSULE | Freq: Three times a day (TID) | ORAL | Status: DC | PRN

## 2023-04-21 MED ORDER — HALOPERIDOL 5 MG PO TABS: 5.0000 mg | ORAL_TABLET | Freq: Three times a day (TID) | ORAL | Status: DC | PRN

## 2023-04-21 MED ORDER — DIPHENHYDRAMINE HCL 50 MG/ML IJ SOLN
50.0000 mg | Freq: Three times a day (TID) | INTRAMUSCULAR | Status: DC | PRN
  Administered 2023-04-21: 50 mg via INTRAMUSCULAR
  Filled 2023-04-21: qty 1

## 2023-04-21 MED ORDER — HYDROXYZINE HCL 25 MG PO TABS
25.0000 mg | ORAL_TABLET | Freq: Three times a day (TID) | ORAL | Status: DC | PRN
  Administered 2023-04-21 – 2023-04-30 (×12): 25 mg via ORAL
  Filled 2023-04-21 (×13): qty 1

## 2023-04-21 MED ORDER — OLANZAPINE 10 MG PO TBDP: 10.0000 mg | ORAL_TABLET | Freq: Three times a day (TID) | ORAL | Status: DC | PRN

## 2023-04-21 MED ORDER — LORAZEPAM 2 MG/ML IJ SOLN: 2.0000 mg | Freq: Three times a day (TID) | INTRAMUSCULAR | Status: DC | PRN

## 2023-04-21 MED ORDER — OLANZAPINE 5 MG PO TBDP
5.0000 mg | ORAL_TABLET | Freq: Two times a day (BID) | ORAL | Status: DC
  Administered 2023-04-21: 5 mg via ORAL
  Filled 2023-04-21 (×5): qty 1

## 2023-04-21 MED ORDER — CLONIDINE HCL 0.1 MG PO TABS: 0.1000 mg | ORAL_TABLET | Freq: Four times a day (QID) | ORAL | Status: DC | PRN

## 2023-04-21 MED ORDER — HALOPERIDOL LACTATE 5 MG/ML IJ SOLN: 5.0000 mg | Freq: Three times a day (TID) | INTRAMUSCULAR | Status: DC | PRN

## 2023-04-21 NOTE — BHH Counselor (Signed)
Adult Comprehensive Assessment  Patient ID: Matthew Padilla, male   DOB: January 22, 1996, 27 y.o.   MRN: [id number]  Information Source: Information source: Patient  Current Stressors:  Patient states their primary concerns and needs for treatment are:: "I know how to cure World Peace and everyone wants me to keep quiet about it." Patient states their goals for this hospitilization and ongoing recovery are:: "to learn how to deal with the stress." Educational / Learning stressors: denies Employment / Job issues: "I work at [employer] and I feel rushed at times." Family Relationships: "I have been fighting with my family a lot lately.  I have been honest lately and they can't take it." Financial / Lack of resources (include bankruptcy): none Housing / Lack of housing: "the only stress is that I live with my parents and am arguing with them." Physical health (include injuries & life threatening diseases): "just allergies." Social relationships: "I have not had a friend since childhood." Substance abuse: "I drink sometimes but it just depends on how drunk I want to get." Bereavement / Loss: none  Living/Environment/Situation:  Living Arrangements: Parent Who else lives in the home?: mom, dad and sister How long has patient lived in current situation?: since 1998 What is atmosphere in current home: Chaotic  Family History:  Marital status: Single Are you sexually active?: No What is your sexual orientation?: "I am not sure but I have never had sex with a woman." Has your sexual activity been affected by drugs, alcohol, medication, or emotional stress?: "no" Does patient have children?: No  Childhood History:  By whom was/is the patient raised?: Both parents Additional childhood history information: pt lives with parents. Pt lived in [place] and moved to [place] 4 years ago. Parents still together, sister in [place] area. One sister at home. Description of patient's relationship with caregiver when they  were a child: pt reports stable childhood with parents--no DV or exposure to abuse How were you disciplined when you got in trouble as a child/adolescent?: "I am not sure." Does patient have siblings?: Yes Number of Siblings: 2 Description of patient's current relationship with siblings: reports that he has an older sister and a younger sister; conflict with younger sister who lives in the home Did patient suffer any verbal/emotional/physical/sexual abuse as a child?: No Did patient suffer from severe childhood neglect?: No Has patient ever been sexually abused/assaulted/raped as an adolescent or adult?: No Was the patient ever a victim of a crime or a disaster?: No Witnessed domestic violence?: No Has patient been affected by domestic violence as an adult?: No  Education:  Highest grade of school patient has completed: 12 Currently a Consulting civil engineer?: No Learning disability?: No  Employment/Work Situation:   Employment Situation: Employed Where is Patient Currently Employed?: [employer] How Long has Patient Been Employed?: "few months" Are You Satisfied With Your Job?: Yes Do You Work More Than One Job?: No Work Stressors: "fast paced" Patient's Job has Been Impacted by Current Illness: No What is the Longest Time Patient has Held a Job?: "this job" Where was the Patient Employed at that Time?: "[employer]" Has Patient ever Been in the [employer]?: No  Financial Resources:   Financial resources: Income from employment Does patient have a representative payee or guardian?: No  Alcohol/Substance Abuse:   What has been your use of drugs/alcohol within the last 12 months?: "just depends on how drunk I want to get" If attempted suicide, did drugs/alcohol play a role in this?: No Has alcohol/substance abuse ever caused legal  problems?: No  Social Support System:   Forensic psychologist System: None Describe Community Support System: "I go to work and come home" Type of faith/religion: "I don't  know what that mean" How does patient's faith help to cope with current illness?: n/a  Leisure/Recreation:   Do You Have Hobbies?: Yes Leisure and Hobbies: video games, watching tv, relaxing  Strengths/Needs:   What is the patient's perception of their strengths?: n/a Patient states they can use these personal strengths during their treatment to contribute to their recovery: n/a Patient states these barriers may affect/interfere with their treatment: n/a Patient states these barriers may affect their return to the community: n/a Other important information patient would like considered in planning for their treatment: n/a  Discharge Plan:   Currently receiving community mental health services: No Patient states concerns and preferences for aftercare planning are: "I have a therapist in [place].  I was suppose to see her on Wednesday" Patient states they will know when they are safe and ready for discharge when: "I don't know." Does patient have access to transportation?: Yes Patient description of barriers related to discharge medications: none Will patient be returning to same living situation after discharge?: Yes  Summary/Recommendations:   Summary and Recommendations (to be completed by the evaluator): Matthew Padilla is a 27 year old male with a history of Paranoid Schizophrenia.  He was admitted into [facility] on 04/19/2023 due to worsening of his MH symptoms.  Matthew Padilla reports that he knows how to solve world peace.  Matthew Padilla reports increase in work stress as he works at [employer] and states the fast paced environment is stressful.  He also reports a trigger of arguing with his family due to him being honest has increased his stress level.  He has a psychiatrist Dr. Lolly Mustache and reports Shanda Bumps is his therapist who practice is in [place].  He reports use of alcohol occassionally. While here, Matthew Padilla can benefit from crisis stabilization, medication management, therapeutic milieu, and referrals for services.   Matthew Padilla. 04/21/2023

## 2023-04-21 NOTE — Progress Notes (Signed)
Recreation Therapy Notes  INPATIENT RECREATION THERAPY ASSESSMENT  Patient Details Name: Matthew Padilla MRN: [id number] DOB: Aug 09, 1996 Today's Date: 04/21/2023       Information Obtained From: Patient  Able to Participate in Assessment/Interview: Yes  Patient Presentation: Responsive, Alert  Reason for Admission (Per Patient): Suicidal Ideation  Patient Stressors: Other (Comment) (Pt stated his reaction to his medication wat his stres)  Coping Skills:   Isolation, Music, Journal, Sports, TV, Arguments, Impulsivity, Substance Abuse, Prayer, Avoidance, Dance, Read, Other (Comment) (Cold bath/shower)  Leisure Interests (2+):  Individual - Reading, Individual - Other (Comment) (Relax-Nothing)  Frequency of Recreation/Participation: Other (Comment) (Relax-Nothing: Daily; Read- not as much as used to.)  Awareness of Community Resources:  Yes  Community Resources:  [employer], Engineering geologist  Current Use: Yes  If no, Barriers?:    Expressed Interest in [employer] Information: No  [employer] of Residence:  Engineer, technical sales  Patient Main Form of Transportation: Set designer  Patient Strengths:  Discipline, Motivation, Will power, Ability to win/jump first  Patient Identified Areas of Improvement:  Same as above  Patient Goal for Hospitalization:  "lose a little weight"  Current SI (including self-harm):  No  Current HI:  No  Current AVH: No  Staff Intervention Plan: Group Attendance, Collaborate with Interdisciplinary Treatment Team  Consent to Intern Participation: N/A   Matthew Padilla, Matthew Padilla,Matthew Padilla Aella Ronda A Chaun Uemura-McCall 04/21/2023, 1:41 PM

## 2023-04-21 NOTE — Progress Notes (Signed)
Pt observed with increased confusion this evening as if sundowning in comparison to this afternoon. Threw chess pieces on the floor in dayroom "I'm sick of this shit over and over, I got you". Pt continues to escalate in his behavior by hitting book aggressively with blank stares at staff. Posturing, shadow boxing at staff when redirected to not enter into peers rooms. Required multiple verbal redirections to this evening. Received PRN Vistaril 25 mg PO at 1603 with very minimal effect. Pt's BP remains elevated,however, he's asymptomatic but hyperactive as well. 1:1 observation maintained, assigned staff in attendance. He tolerated meals and fluids well. Continues to need lots of encouragement and support due to his disorganized state at present.

## 2023-04-21 NOTE — Progress Notes (Signed)
[facility] MD Progress Note  04/21/2023 5:34 PM Matthew Padilla  MRN:  [id number] Principal Problem: Schizophrenia (HCC) Diagnosis: Principal Problem:   Schizophrenia (HCC)   Subjective:   Matthew Padilla is a 52 y M with past psychiatric history of schizophrenia, paranoid type and past medical history of hypogonadism. He presented to [facility] ED for psychosis and tachycardia and found to have mild rhabdomyolysis. Patient was medically stabilized and admitted to inpatient [facility] under IVC for possible exacerbation of schizophrenia.    Case was discussed in the multidisciplinary team. MAR was reviewed and patient is partially compliant with medications.   PRNs in last 24 hours:  Events / PRNs administered:  -- 7/17 0755 agitation PRNs -- 7/17 1600 hydroxyzine 25 mg - anxiety  Psychiatric Team made the following recommendations yesterday:             -- Start Risperdal m-tabs, 2 mg oral dissolvable tablets BID for acute psychosis             -- Propranolol 40 mg TID for tachycardia, blood pressure, anxiolysis.             -- PRN diphenhydramine 50 mg / lorazepam 2 mg / ziprasidone 10 mg   Today on morning interview, pt reports okay sleep, feeling better. Reported good appetite and was able to recall (approximately) what he had eaten. Could not remember eating the night before. Pt recent recall is still altered.  Had a longer conversation to understand his drug use prior to admission. Pt reported mixing several varieties of psychoactive mushrooms including dried capsules of a mail-order variety, some dried mushrooms that he already had, and one fresh one that he obtained. Unclear exactly how much he actually took in total, but he admitted that it was more than the 1-2 grams he sometimes medicates with "to be able to go to a museum" or "3-5 grams of dried mushrooms to really be out there." Discussed briefly with him how those mushrooms come in varying strengths and it can be dangerous to mix them  together. Unclear how much the patient was able to retain after the interview, but he was able to repeat back the pieces he understood about the serotonergic pathway. He agreed to try and use medications that primarily work along dopaminergic pathways while he is here to help him adjust and get along with other patients and people.   Sleep: Fair Appetite:  Good Depression: Denies Anxiety: Admits to "a little" Auditory Hallucinations: Denies Visual Hallucinations: Denies Paranoia: Denies HI: Denies SI: Denies Side effects from medications: does not endorse any side-effects they attribute to medications. Other concerns discussed with patient:  Total time spent with patient: 30 minutes  Past psychiatric history: Diagnosed with schizophrenia in 2022 after previous hospitalization for psychosis. Pt has experienced past psychotic episodes in the summer months and depressive symptoms in the fall. Two total prior psychiatric hospitalizations in the past, with the most recent in 2022 for bizarre behavior and psychosis.   Past Medical History:  Past Medical History:  Diagnosis Date   Anxiety    Low testosterone in male    Paranoid schizophrenia (HCC)     History reviewed. No pertinent surgical history. Family History: History reviewed. No pertinent family history. Family Psychiatric History: fmhx of bipolar disorder in patient's aunt well-controlled on lithium.  Social History:  Social History   Substance and Sexual Activity  Alcohol Use Not Currently     Social History   Substance and Sexual Activity  Drug  Use Yes   Types: Other-see comments    Social History   Socioeconomic History   Marital status: Single    Spouse name: Not on file   Number of children: Not on file   Years of education: Not on file   Highest education level: Not on file  Occupational History   Not on file  Tobacco Use   Smoking status: Never   Smokeless tobacco: Never  Substance and Sexual Activity    Alcohol use: Not Currently   Drug use: Yes    Types: Other-see comments   Sexual activity: Never  Other Topics Concern   Not on file  Social History Narrative   Not on file   Social Determinants of Health   Financial Resource Strain: Not on file  Food Insecurity: Patient Declined (04/19/2023)   Hunger Vital Sign    Worried About Running Out of Food in the Last Year: Patient declined    Ran Out of Food in the Last Year: Patient declined  Transportation Needs: Patient Declined (04/19/2023)   PRAPARE - Administrator, Civil Service (Medical): Patient declined    Lack of Transportation (Non-Medical): Patient declined  Physical Activity: Not on file  Stress: Not on file  Social Connections: Not on file   Additional Social History:   Collateral obtained from: mother on 7/16- see H&P  Current Medications: Current Facility-Administered Medications  Medication Dose Route Frequency Provider Last Rate Last Admin   acetaminophen (TYLENOL) tablet 650 mg  650 mg Oral Q6H PRN Eligha Bridegroom, NP       alum & mag hydroxide-simeth (MAALOX/MYLANTA) 200-200-20 MG/5ML suspension 30 mL  30 mL Oral Q4H PRN Eligha Bridegroom, NP       LORazepam (ATIVAN) tablet 2 mg  2 mg Oral TID PRN Phineas Inches, MD       And   diphenhydrAMINE (BENADRYL) capsule 50 mg  50 mg Oral TID PRN Massengill, Harrold Donath, MD       diphenhydrAMINE (BENADRYL) injection 50 mg  50 mg Intramuscular TID PRN Massengill, Harrold Donath, MD       hydrOXYzine (ATARAX) tablet 25 mg  25 mg Oral TID PRN Phineas Inches, MD   25 mg at 04/21/23 1603   magnesium hydroxide (MILK OF MAGNESIA) suspension 30 mL  30 mL Oral Daily PRN Eligha Bridegroom, NP       OLANZapine (ZYPREXA) injection 5 mg  5 mg Intramuscular TID PRN Massengill, Harrold Donath, MD       OLANZapine zydis (ZYPREXA) disintegrating tablet 10 mg  10 mg Oral TID PRN Massengill, Harrold Donath, MD       OLANZapine zydis (ZYPREXA) disintegrating tablet 5 mg  5 mg Oral Q12H Massengill,  Nathan, MD       propranolol (INDERAL) tablet 40 mg  40 mg Oral TID Margaretmary Dys, MD   40 mg at 04/21/23 1653   risperiDONE (RISPERDAL M-TABS) disintegrating tablet 3 mg  3 mg Oral BID Massengill, Harrold Donath, MD       traZODone (DESYREL) tablet 50 mg  50 mg Oral QHS PRN Massengill, Harrold Donath, MD        Lab Results:  No results found for this or any previous visit (from the past 48 hour(s)).  Blood Alcohol level:  Lab Results  Component Value Date   [facility] <10 04/18/2023   ETH <10 07/08/2021    Metabolic Disorder Labs: Lab Results  Component Value Date   HGBA1C 5.5 06/11/2022   Lab Results  Component Value Date  PROLACTIN 22.3 (H) 09/11/2022   No results found for: "CHOL", "TRIG", "HDL", "CHOLHDL", "VLDL", "LDLCALC"  Physical Findings: AIMS: Facial and Oral Movements Muscles of Facial Expression: None, normal Lips and Perioral Area: None, normal Jaw: None, normal Tongue: None, normal,Extremity Movements Upper (arms, wrists, hands, fingers): None, normal Lower (legs, knees, ankles, toes): None, normal, Trunk Movements Neck, shoulders, hips: None, normal, Overall Severity Severity of abnormal movements (highest score from questions above): None, normal Incapacitation due to abnormal movements: None, normal Patient's awareness of abnormal movements (rate only patient's report): No Awareness, Dental Status Current problems with teeth and/or dentures?: No Does patient usually wear dentures?: No  CIWA:    COWS:     Musculoskeletal: Strength & Muscle Tone: within normal limits Gait & Station: normal Patient leans: N/A  Psychiatric Specialty Exam:  Presentation  General Appearance:   Casual; Disheveled   Eye Contact:  Fleeting  Speech:  Normal Rate  Speech Volume:  Normal  Handedness:  Right    Mood and Affect  Mood:  Euphoric ("I feel good")  Affect:  Inappropriate; Non-Congruent; Restricted; Labile (Pt had inappropriate laughter (while  discussing punching the wall yesterday and violating people's personal boundaries, eg pretending to be a Emergency planning/management officer).)   Thought Process  Thought Processes:  Disorganized  Descriptions of Associations: Loose  Orientation: Partial ("I feel like it's August 27th. But I think it's actually July 16th.")  Thought Content: Illogical; Scattered; Tangential (Patient made many statements that were bizarre: "the trees don't have to be told to grow, so neither do I", "I FELT that the porch was rotten, so I prayed to God that if it was rotten to give me the strength to tear off the lamp, and I did!")  History of Schizophrenia/Schizoaffective disorder: Yes  Duration of Psychotic Symptoms: Greater than six months  Hallucinations: Hallucinations: None (Denies current. Pt smiling and responding to internal stimuli.) Description of Command Hallucinations: telling him to walk around the room  Ideas of Reference: Delusions (Believed that two women in the grocery store were talking about him because they were saying how proud they were of their son for accomplishing his goals, and "I knew that they were really talking about me because I accomplish mine")  Suicidal Thoughts: Suicidal Thoughts: No  Homicidal Thoughts: Homicidal Thoughts: No    Sensorium  Memory: Immediate Fair; Recent Fair; Remote Fair   Judgment:  Impaired   Insight:  Lacking    Executive Functions  Concentration:  Poor  Attention Span:  Poor  Recall:  Poor  Fund of Knowledge:  Fair  Language:  Fair  Psychomotor Activity  Psychomotor Activity:  Psychomotor Activity: Increased AIMS Completed?: No   Physical Exam: Physical Exam  ROS  Blood pressure (!) 158/94, pulse (!) 107, temperature 98.3 F (36.8 C), temperature source Oral, resp. rate 16, height 6\' 5"  (1.956 m), weight (!) 171 kg, SpO2 99%. Body mass index is 44.71 kg/m.  ASSESSMENT: Matthew Padilla is a 52 y M with past psychiatric history  of schizophrenia, paranoid type and past medical history of hypogonadism. He presented to [facility] ED for tachycardia and found to have mild rhabdomyolysis. Patient was medically stabilized and admitted to inpatient [facility] under IVC for possible exacerbation of schizophrenia.    Patient accurately states the name of his outpatient psychiatrist Dr. Kathryne Sharper. He notes his last visit was several months ago although on chart review his last virtual follow up was on 7/3. At the time, patient was reportedly taking Zoloft 50  mg and Risperdal 1 mg as per Dr. Clarise Cruz note, but patient denies taking all medications for the last several months as they were not working for him. He has had 2 prior psychiatric hospitalizations in the past, with the most recent in 2022 for bizarre behavior and psychosis.    Pt on exam this morning was still disorganized with scattered thought processes and thought content. Patient is frequently distracted during the interview and poses questions to the interviewing team. Patient remains unable to focus for long stretches of time, going off onto grandiose tangents ("I am super duper good at math. I am in pre-calculus now, but when I'm done with this, I will be the best, possibly in the world"). Pt verbalized "I consent to a medication trial" when the discussion turned to changing his medications while he is here. He is no longer acutely agitated, but continues to need frequent redirection from staff due to his habit of invading the personal spaces of others. Pt at one point banged on another patient's door and said "[employer], open up!". Will increase his risperidone, add on a brief course of zyprexa, then see how he is tomorrow.  A key to his remaining well-adjusted in the outside world will be getting through to him about how psychoactive mushrooms not regulated or tested and how much this illicit drug use puts him at risk for decompensation.  Diagnoses / Active  Problems: -- Schizophrenia vs Schizoaffective Disorder -- Substance-Induced Psychosis  PLAN: Safety and Monitoring:  -- In-Voluntary admission to inpatient psychiatric unit for safety, stabilization and treatment  -- Daily contact with patient to assess and evaluate symptoms and progress in treatment  -- Patient's case to be discussed in multi-disciplinary team meeting  -- Observation Level : q15 minute checks  -- Vital signs:  q12 hours  -- Precautions: suicide, elopement, and assault  2. Psychiatric Diagnoses and Treatment:  -- Increase Risperdal m-tabs, 3 mg oral dissolvable tablets BID for acute psychosis -- Start Zyprexa 5 mg disintegrating tablet 5 mg Q12 (2000 and 0800)             -- Continue Propranolol 40 mg TID for tachycardia, blood pressure, anxiolysis.             -- Changed PRNs diphenhydramine 50 mg / lorazepam 2 mg / zyprexa 5 mg injection / 10 mg disintegrating tablet  -- Continue med 3 for indication -- --  The risks/benefits/side-effects/alternatives to this medication were discussed in detail with the patient and time was given for questions. The patient consents to medication trial.   -- Metabolic profile and EKG monitoring obtained while on an atypical antipsychotic (Body mass index is 44.71 kg/m., Lipid Panel: ,HbgA1c: , QTc:435 on 7/16)   -- Encouraged patient to participate in unit milieu and in scheduled group therapies   -- Short Term Goals: Ability to demonstrate self-control will improve and Ability to identify triggers associated with substance abuse/mental health issues will improve  -- Long Term Goals: Improvement in symptoms so as ready for discharge    3. Medical Issues Being Addressed:    Labs reviewed, notable for recovering rhabdomyolysis, mild troponin leak, elevated AST, positive for THC.    4. Discharge Planning:   -- Social work and case management to assist with discharge planning and identification of hospital follow-up needs prior to  discharge  -- Estimated LOS: 5-7 days  -- Discharge Concerns: Need to establish a safety plan; Medication compliance and effectiveness  -- Discharge Goals: Return  home with outpatient referrals for mental health follow-up including medication management/psychotherapy   Margaretmary Dys, MD 04/21/2023, 5:34 PM

## 2023-04-21 NOTE — BH IP Treatment Plan (Signed)
Interdisciplinary Treatment and Diagnostic Plan Update  04/21/2023 Time of Session: 10:51am Shjon Lizarraga MRN: [id number]  Principal Diagnosis: Schizophrenia [facility])  Secondary Diagnoses: Principal Problem:   Schizophrenia (HCC)   Current Medications:  Current Facility-Administered Medications  Medication Dose Route Frequency Provider Last Rate Last Admin   acetaminophen (TYLENOL) tablet 650 mg  650 mg Oral Q6H PRN Eligha Bridegroom, NP       alum & mag hydroxide-simeth (MAALOX/MYLANTA) 200-200-20 MG/5ML suspension 30 mL  30 mL Oral Q4H PRN Eligha Bridegroom, NP       LORazepam (ATIVAN) tablet 2 mg  2 mg Oral TID PRN Phineas Inches, MD       And   diphenhydrAMINE (BENADRYL) capsule 50 mg  50 mg Oral TID PRN Massengill, Harrold Donath, MD       diphenhydrAMINE (BENADRYL) injection 50 mg  50 mg Intramuscular TID PRN Massengill, Harrold Donath, MD       hydrOXYzine (ATARAX) tablet 25 mg  25 mg Oral TID PRN Massengill, Harrold Donath, MD       magnesium hydroxide (MILK OF MAGNESIA) suspension 30 mL  30 mL Oral Daily PRN Eligha Bridegroom, NP       OLANZapine (ZYPREXA) injection 5 mg  5 mg Intramuscular TID PRN Massengill, Harrold Donath, MD       OLANZapine zydis (ZYPREXA) disintegrating tablet 10 mg  10 mg Oral TID PRN Massengill, Harrold Donath, MD       OLANZapine zydis (ZYPREXA) disintegrating tablet 5 mg  5 mg Oral Q12H Massengill, Nathan, MD       propranolol (INDERAL) tablet 40 mg  40 mg Oral TID Margaretmary Dys, MD   40 mg at 04/21/23 1254   risperiDONE (RISPERDAL M-TABS) disintegrating tablet 3 mg  3 mg Oral BID Massengill, Harrold Donath, MD       traZODone (DESYREL) tablet 50 mg  50 mg Oral QHS PRN Massengill, Harrold Donath, MD       PTA Medications: Medications Prior to Admission  Medication Sig Dispense Refill Last Dose   acetaminophen (TYLENOL) 500 MG tablet Take 500 mg by mouth every 6 (six) hours as needed for moderate pain.      clomiPHENE (CLOMID) 50 MG tablet Take 1 tablet (50 mg total) by mouth daily. 30  tablet 6    ibuprofen (ADVIL) 200 MG tablet Take 200-400 mg by mouth every 6 (six) hours as needed for moderate pain.      risperiDONE (RISPERDAL) 1 MG tablet TAKE 1 TABLET(1 MG) BY MOUTH AT BEDTIME 30 tablet 2    sertraline (ZOLOFT) 50 MG tablet Take 0.5-1 tablets (25-50 mg total) by mouth daily. (Patient not taking: Reported on 04/18/2023) 30 tablet 2     Patient Stressors: Health problems   Medication change or noncompliance   Substance abuse    Patient Strengths: Supportive family/friends   Treatment Modalities: Medication Management, Group therapy, Case management,  1 to 1 session with clinician, Psychoeducation, Recreational therapy.   Physician Treatment Plan for Primary Diagnosis: Schizophrenia (HCC) Long Term Goal(s): Improvement in symptoms so as ready for discharge   Short Term Goals: Ability to demonstrate self-control will improve Ability to maintain clinical measurements within normal limits will improve  Medication Management: Evaluate patient's response, side effects, and tolerance of medication regimen.  Therapeutic Interventions: 1 to 1 sessions, Unit Group sessions and Medication administration.  Evaluation of Outcomes: Not Progressing  Physician Treatment Plan for Secondary Diagnosis: Principal Problem:   Schizophrenia (HCC)  Long Term Goal(s): Improvement in symptoms so as ready for discharge  Short Term Goals: Ability to demonstrate self-control will improve Ability to maintain clinical measurements within normal limits will improve     Medication Management: Evaluate patient's response, side effects, and tolerance of medication regimen.  Therapeutic Interventions: 1 to 1 sessions, Unit Group sessions and Medication administration.  Evaluation of Outcomes: Not Progressing   RN Treatment Plan for Primary Diagnosis: Schizophrenia (HCC) Long Term Goal(s): Knowledge of disease and therapeutic regimen to maintain health will improve  Short Term Goals:  Ability to remain free from injury will improve, Ability to verbalize frustration and anger appropriately will improve, Ability to demonstrate self-control, Ability to participate in decision making will improve, Ability to verbalize feelings will improve, Ability to disclose and discuss suicidal ideas, Ability to identify and develop effective coping behaviors will improve, and Compliance with prescribed medications will improve  Medication Management: RN will administer medications as ordered by provider, will assess and evaluate patient's response and provide education to patient for prescribed medication. RN will report any adverse and/or side effects to prescribing provider.  Therapeutic Interventions: 1 on 1 counseling sessions, Psychoeducation, Medication administration, Evaluate responses to treatment, Monitor vital signs and CBGs as ordered, Perform/monitor CIWA, COWS, AIMS and Fall Risk screenings as ordered, Perform wound care treatments as ordered.  Evaluation of Outcomes: Not Progressing   LCSW Treatment Plan for Primary Diagnosis: Schizophrenia (HCC) Long Term Goal(s): Safe transition to appropriate next level of care at discharge, Engage patient in therapeutic group addressing interpersonal concerns.  Short Term Goals: Engage patient in aftercare planning with referrals and resources, Increase social support, Increase ability to appropriately verbalize feelings, Increase emotional regulation, and Increase skills for wellness and recovery  Therapeutic Interventions: Assess for all discharge needs, 1 to 1 time with Social worker, Explore available resources and support systems, Assess for adequacy in community support network, Educate family and significant other(s) on suicide prevention, Complete Psychosocial Assessment, Interpersonal group therapy.  Evaluation of Outcomes: Not Progressing   Progress in Treatment: Attending groups: Yes. Participating in groups: Yes. Taking  medication as prescribed: Yes. Toleration medication: Yes. Family/Significant other contact made: No, will contact:  Kahiau Schewe, mother, [phone] Patient understands diagnosis: Yes. Discussing patient identified problems/goals with staff: Yes. Medical problems stabilized or resolved: Yes. Denies suicidal/homicidal ideation: Yes. Issues/concerns per patient self-inventory: No. Other: n/a     New problem(s) identified: No, Describe:  patient did not identify any new problems.   New Short Term/Long Term Goal(s): detox, medication management for mood stabilization; elimination of SI thoughts; development of comprehensive mental wellness/sobriety plan  OR   medication stabilization, elimination of SI thoughts, development of comprehensive mental wellness plan.    Patient Goals:  " I want to work on going home as soon as possible"  Discharge Plan or Barriers: Patient recently admitted. CSW will continue to follow and assess for appropriate referrals and possible discharge planning.    Reason for Continuation of Hospitalization: Other; describe Schizophrenia   Estimated Length of Stay: 5 to 7 days   Last 3 [place] Suicide Severity Risk Score: Flowsheet Row Ad[facility] (Current) [facility] from [facility]18[facility]20[facility] Counselor from 02/15/2023 in [facility] RISK CATEGORY No Risk No Risk Error: Question 1 not populated       Last [facility] 2/9 Scores:    01/05/2023   12:35 PM 08/13/2022    7:46 AM 09/08/2021    1:27 PM  Depression screen PHQ 2/9  Decreased Interest 0 0 1  Down, Depressed, Hopeless 0 0 0  PHQ - 2 Score 0 0 1  Altered sleeping  0   Tired, decreased energy  0   Change in appetite  0   Feeling bad or failure about yourself   1   Trouble concentrating  0   Moving slowly or fidgety/restless  0   Suicidal thoughts  0    PHQ-9 Score  1   Difficult doing work/chores  Not difficult at all     Scribe for Treatment Team: Veva Holes, Theresia Majors 04/21/2023 2:42 PM

## 2023-04-21 NOTE — Progress Notes (Signed)
   04/21/23 0644  15 Minute Checks  Location Bedroom  Visual Appearance Calm  Behavior Composed  Sleep (Behavioral Health Patients Only)  Calculate sleep? (Click Yes once per 24 hr at 0600 safety check) Yes  Documented sleep last 24 hours 8.75

## 2023-04-21 NOTE — Progress Notes (Addendum)
Pt A & O to self and place. Denies SI, HI, AVH and pain. Confirmed he slept well last night with good appetite "but the food not enough". Pt remains moderately confused, disorganized; believes he got married today, can't find his bride. Observed banging on peers' doors, yelling "Open up, [facility] PD, come out right now". Continues to need verbal redirections to stop going into peers' rooms, laundry hamper and to stop blowing his nose in his shirt. Received agitation protocol this AM at 0755 as pt was not redirectable at the time. 1:1 observation maintained as ordered and assigned staff in attendance at all times. Support, encouragement and reassurance offered. Pt tolerated breakfast and fluids well. Awake in dayroom at this time.

## 2023-04-21 NOTE — Progress Notes (Signed)
   04/21/23 1945  Psych Admission Type (Psych Patients Only)  Admission Status Involuntary  Psychosocial Assessment  Patient Complaints Anxiety;Agitation;Irritability;Restlessness  Eye Contact Fair  Facial Expression Anxious;Grimacing  Affect Preoccupied  Speech Argumentative  Interaction Defensive  Motor Activity Ritualistic  Appearance/Hygiene Unremarkable  Behavior Characteristics Cooperative;Fidgety;Impulsive;Irritable;Pacing;Posturing;Wandering risk  Mood Preoccupied;Irritable  Aggressive Behavior  Targets Property  Type of Behavior Striking out;Unprovoked;Other (Comment) (punched ceiling.)  Effect No apparent injury;Property damaged  Thought Process  Coherency Disorganized  Content Preoccupation  Delusions None reported or observed  Perception Hallucinations  Hallucination Auditory  Judgment Impaired  Confusion Moderate  Danger to Self  Current suicidal ideation? Denies  Danger to Others  Danger to Others None reported or observed

## 2023-04-21 NOTE — Progress Notes (Addendum)
At 19:45 pm patient was observed yelling, screaming  with  disorganized thought pattern,  posturing at staff,  pacing in his room and  on the hall way,  required multiple re- direction from staff , Patient eventually punched  a hole in the ceiling. Agitation protocol was administered at this time, and patient taken to quiet room for safety precaution. 1:1  observation in progress, will continue to monitor and follow plan of care.

## 2023-04-21 NOTE — [facility]HH Group Notes (Signed)
Psychoeducational Group Note  Date:  04/21/2023 Time:  2000  Group Topic/Focus:  Wrap up group  Participation Level: Did Not Attend  Participation Quality:  Not Applicable  Affect:  Not Applicable  Cognitive:  Not Applicable  Insight:  Not Applicable  Engagement in Group: Not Applicable  Additional Comments:  Did not attend.   Marcille Buffy 04/21/2023, 9:22 PM

## 2023-04-21 NOTE — Progress Notes (Signed)
1:1 Notes: Patient  still oriented to self only, patient  responded to agitation intervention positively showing reduced agitation , appears to be calm and cooperative with care and engaging in conversation with staff at this time, speech is still rapid, mood is still slightly anxious and  irritable, his thought process is still disorganized with poor judgement.  Continue with one to one  supervision as ordered.

## 2023-04-21 NOTE — Progress Notes (Signed)
1:1 Notes:  Patient awake this morning, appeared calm , denies pain and discomfort vital signs taken, patient complained of huger and thirst, was given a cup of water and breakfast. 1:1 staff present.  Patient  noted pacing the hall and composed.

## 2023-04-21 NOTE — Group Note (Signed)
Date:  04/21/2023 Time:  9:23 AM  Group Topic/Focus:  Goals Group:   The focus of this group is to help patients establish daily goals to achieve during treatment and discuss how the patient can incorporate goal setting into their daily lives to aide in recovery.    Participation Level:  Active  Participation Quality:  Attentive and Redirectable  Affect:  Appropriate  Cognitive:  Disorganized  Insight: Lacking  Engagement in Group:  Engaged and Limited  Modes of Intervention:  Clarification, Discussion, Limit-setting, and Reality Testing  Additional Comments:  Pt stated that he wanted to follow the rules today because he did some things he wasn't supposed to do. MHT went over rules with pt to assist him with accomplishing this goal.   Donell Beers 04/21/2023, 9:23 AM

## 2023-04-21 NOTE — Progress Notes (Signed)
1:1 Notes: Patient in bed with eyes close, respirations even and unlabored, all signs of life present !:! Staff present at bedside.

## 2023-04-21 NOTE — Group Note (Signed)
Recreation Therapy Group Note   Group Topic:Self-Esteem  Group Date: 04/21/2023 Start Time: 1040 End Time: 1115 Facilitators: Charmine Bockrath-McCall, LRT,CTRS Location: 500 Hall Dayroom   Goal Area(s) Addresses:  Patient will be able to identify the importance of healthy self esteem. Patient will successfully share benefits of healthy self esteem. Patient will identify how positive self esteem can benefit them post d/c.     Group Description:  Patient and LRT discussed the importance of self esteem. Patients and LRT also discussed what affects the way a person feels about themselves. Pt were to pick one of the blank faces. Patients would then draw on the blank face how they see themselves. Patients were to draw the characteristics on the face. Patients could also use words and other pictures to express their feelings. Patients were given colored pencils, markers and pencils to complete the assignment.    Affect/Mood: Appropriate   Participation Level: Minimal   Participation Quality: Independent   Behavior: Cooperative   Speech/Thought Process: Coherent and Relevant   Insight: Fair   Judgement: Fair    Modes of Intervention: Art, Music   Patient Response to Interventions:  Attentive   Education Outcome:  Acknowledges education   Clinical Observations/Individualized Feedback: Pt was more attentive and able to stay on task. Pt was pleasant and receptive during group. Pt didn't complete the activity but did engage with the music and requested songs from various genres and artists. Pt was appropriate during group.    Plan: Continue to engage patient in RT group sessions 2-3x/week.   Truda Staub-McCall, LRT,CTRS 04/21/2023 1:09 PM

## 2023-04-21 NOTE — Progress Notes (Signed)
Pt alert, conversing with assigned CSW in his room at present. Tolerated lunch and fluids well. Remains preoccupied with inappropriate laugher. He is currently redirectable at this time. 1:1 observation maintained, assigned staff in attendance at all times.

## 2023-04-21 NOTE — Progress Notes (Addendum)
Pt is awake, pacing, and attempting to go into other pt's rooms. Pt banging on a peer's door stating, "[facility], open up! " Pt requires frequent redirections. Agitation protocol administered at 0755. Pt denies SI/HI/AVH. Pt on 1:1 for safety. Will continue to monitor. Pt remains safe on the unit.

## 2023-04-22 DIAGNOSIS — F203 Undifferentiated schizophrenia: Secondary | ICD-10-CM | POA: Diagnosis not present

## 2023-04-22 LAB — COMPREHENSIVE METABOLIC PANEL
ALT: 59 U/L — ABNORMAL HIGH (ref 0–44)
AST: 53 U/L — ABNORMAL HIGH (ref 15–41)
Albumin: 4.1 g/dL (ref 3.5–5.0)
Alkaline Phosphatase: 50 U/L (ref 38–126)
Anion gap: 10 (ref 5–15)
BUN: 14 mg/dL (ref 6–20)
CO2: 19 mmol/L — ABNORMAL LOW (ref 22–32)
Calcium: 8.3 mg/dL — ABNORMAL LOW (ref 8.9–10.3)
Chloride: 103 mmol/L (ref 98–111)
Creatinine, Ser: 0.94 mg/dL (ref 0.61–1.24)
GFR, Estimated: >60 mL/min (ref >60)
Glucose, Bld: 115 mg/dL — ABNORMAL HIGH (ref 70–99)
Potassium: 3.4 mmol/L — ABNORMAL LOW (ref 3.5–5.1)
Sodium: 132 mmol/L — ABNORMAL LOW (ref 135–145)
Total Bilirubin: 1 mg/dL (ref 0.3–1.2)
Total Protein: 6.9 g/dL (ref 6.5–8.1)

## 2023-04-22 LAB — CK: Total CK: 937 U/L — ABNORMAL HIGH (ref 49–397)

## 2023-04-22 LAB — LIPID PANEL
Cholesterol: 126 mg/dL (ref 0–200)
HDL: 21 mg/dL — ABNORMAL LOW (ref >40)
LDL Cholesterol: 82 mg/dL (ref 0–99)
Total CHOL/HDL Ratio: 6 RATIO
Triglycerides: 113 mg/dL (ref <150)
VLDL: 23 mg/dL (ref 0–40)

## 2023-04-22 MED ORDER — CHLORPROMAZINE HCL 25 MG PO TABS: 25.0000 mg | ORAL_TABLET | Freq: Once | ORAL | Status: DC

## 2023-04-22 MED ORDER — TRAZODONE HCL 100 MG PO TABS
200.0000 mg | ORAL_TABLET | Freq: Every evening | ORAL | Status: DC | PRN
  Administered 2023-04-22 – 2023-05-05 (×14): 200 mg via ORAL
  Filled 2023-04-22 (×14): qty 2

## 2023-04-22 MED ORDER — CLONIDINE HCL 0.1 MG PO TABS: 0.1000 mg | ORAL_TABLET | ORAL | Status: DC | PRN

## 2023-04-22 MED ORDER — CHLORPROMAZINE HCL 25 MG/ML IJ SOLN: 25.0000 mg | Freq: Three times a day (TID) | INTRAMUSCULAR | Status: DC | PRN

## 2023-04-22 MED ORDER — CHLORPROMAZINE HCL 100 MG PO TABS
100.0000 mg | ORAL_TABLET | Freq: Every day | ORAL | Status: DC
  Administered 2023-04-22 – 2023-04-26 (×5): 100 mg via ORAL
  Filled 2023-04-22 (×6): qty 1
  Filled 2023-04-22: qty 4
  Filled 2023-04-22: qty 1

## 2023-04-22 MED ORDER — LORAZEPAM 1 MG PO TABS
2.0000 mg | ORAL_TABLET | Freq: Three times a day (TID) | ORAL | Status: DC | PRN
  Administered 2023-04-23 – 2023-05-02 (×10): 2 mg via ORAL
  Filled 2023-04-22 (×11): qty 2

## 2023-04-22 MED ORDER — CHLORPROMAZINE HCL 25 MG PO TABS
50.0000 mg | ORAL_TABLET | Freq: Three times a day (TID) | ORAL | Status: DC | PRN
  Administered 2023-04-24 – 2023-05-03 (×9): 50 mg via ORAL
  Filled 2023-04-22 (×10): qty 2

## 2023-04-22 MED ORDER — CHLORPROMAZINE HCL 25 MG PO TABS
25.0000 mg | ORAL_TABLET | Freq: Three times a day (TID) | ORAL | Status: DC
  Administered 2023-04-22: 25 mg via ORAL
  Filled 2023-04-22 (×5): qty 1

## 2023-04-22 MED ORDER — CHLORPROMAZINE HCL 25 MG PO TABS
25.0000 mg | ORAL_TABLET | Freq: Two times a day (BID) | ORAL | Status: DC
  Administered 2023-04-22 – 2023-04-23 (×2): 25 mg via ORAL
  Filled 2023-04-22 (×4): qty 1

## 2023-04-22 MED ORDER — LORAZEPAM 2 MG/ML IJ SOLN: 2.0000 mg | Freq: Three times a day (TID) | INTRAMUSCULAR | Status: DC | PRN

## 2023-04-22 NOTE — Group Note (Unsigned)
Occupational Therapy Group Note  Group Topic:Coping Skills  Group Date: 04/22/2023 Start Time: 1430 End Time: 1500 Facilitators: Ted Mcalpine, OT   Group Description: Group encouraged increased engagement and participation through discussion and activity focused on "Coping Ahead." Patients were split up into teams and selected a card from a stack of positive coping strategies. Patients were instructed to act out/charade the coping skill for other peers to guess and receive points for their team. Discussion followed with a focus on identifying additional positive coping strategies and patients shared how they were going to cope ahead over the weekend while continuing hospitalization stay.  Therapeutic Goal(s): Identify positive vs negative coping strategies. Identify coping skills to be used during hospitalization vs coping skills outside of hospital/at home Increase participation in therapeutic group environment and promote engagement in treatment   Participation Level: {OT [facility] Participation GNFAO:13086}   Participation Quality: {OT [facility] Participation Quality:26268}   Behavior: {B[facility] OT Group Behavior:26269}   Speech/Thought Process: {B[facility] OT Speech/Thought Process:26270}   Affect/Mood: {OT [facility] Affect/Mood:26271}   Insight: {OT [facility] Insight:26272}   Judgement: {OT [facility] Judgement:26272}   Individualization: *** was *** in their participation of group discussion/activity. *** identified  Modes of Intervention: {BHH MODES OF INTERVENTION:26273}  Patient Response to Interventions:  {B[facility] OT Patient Response to Interventions:26274}   Plan: Continue to engage patient in OT groups 2 - 3x/week.  04/22/2023  Ted Mcalpine, OT

## 2023-04-22 NOTE — [facility] Group Notes (Signed)
[facility] Group Notes:  (Nursing/MHT/Case Management/Adjunct)  Date:  04/22/2023  Time:  2015  Type of Therapy:   wrap up group  Participation Level:  Active  Participation Quality:  Attentive and Sharing  Affect:  Blunted  Cognitive:  Hallucinating  Insight:  Limited  Engagement in Group:  Developing/Improving  Modes of Intervention:  Clarification, Education, and Socialization  Summary of Progress/Problems: Positive thinking and self-care were discussed.   Marcille Buffy 04/22/2023, 8:44 PM

## 2023-04-22 NOTE — Progress Notes (Signed)
   04/22/23 0600  15 Minute Checks  Location Bedroom  Visual Appearance Fearful/Worried  Behavior Anxious  Sleep (Behavioral Health Patients Only)  Calculate sleep? (Click Yes once per 24 hr at 0600 safety check) Yes  Documented sleep last 24 hours 3.75

## 2023-04-22 NOTE — Group Note (Signed)
Recreation Therapy Group Note   Group Topic:Problem Solving  Group Date: 04/22/2023 Start Time: 1025 End Time: 1052 Facilitators: Channel Papandrea-McCall, LRT,CTRS Location: 500 [address] Dayroom   Goal Area(s) Addresses:  Patient will effectively work with peer towards shared goal.  Patient will identify skills used to make activity successful.  Patient will identify how skills used during activity can be used to reach post d/c goals.   Group Description: Straw Bridge. In teams of 3-5, patients were given 15 plastic drinking straws and an equal length of masking tape. Using the materials provided, patients were instructed to build a free standing bridge-like structure to suspend an everyday item (ex: puzzle box) off of the floor or table surface. All materials were required to be used by the team in their design. LRT facilitated post-activity discussion reviewing team process. Patients were encouraged to reflect how the skills used in this activity can be generalized to daily life post discharge.   Affect/Mood: Anxious   Participation Level: None   Participation Quality: Moderate Cues   Behavior: Impulsive and Off-task   Speech/Thought Process: Unfocused   Insight: Lacking   Judgement: Lacking    Modes of Intervention: STEM Activity   Patient Response to Interventions:  Disengaged   Education Outcome:  In group clarification offered    Clinical Observations/Individualized Feedback: Pt was constantly up and down and couldn't sit still. Pt had to be redirected multiple times. Pt didn't participate in activity. Pt eventually left with sitter and did not return.      Plan: Continue to engage patient in RT group sessions 2-3x/week.   Revere Maahs-McCall, LRT,CTRS 04/22/2023 12:14 PM

## 2023-04-22 NOTE — Progress Notes (Signed)
Patient witnessed in the hallway no distress noted. Remains on 1:1 for safety.

## 2023-04-22 NOTE — Progress Notes (Signed)
Patient witnessed in the dayroom,  no distress noted. Remains on 1:1 for safety.

## 2023-04-22 NOTE — Progress Notes (Addendum)
[facility] MD Progress Note  04/22/2023 10:33 AM Matthew Padilla  MRN:  [id number] Principal Problem: Schizophrenia (HCC) Diagnosis: Principal Problem:   Schizophrenia (HCC)   Subjective:   Matthew Padilla is a 75 y M with past psychiatric history of schizophrenia, paranoid type and past medical history of hypogonadism. He presented to [facility] ED for psychosis and tachycardia and found to have mild rhabdomyolysis. Patient was medically stabilized and admitted to inpatient [facility] under IVC for possible exacerbation of schizophrenia.   Case was discussed in the multidisciplinary team. MAR was reviewed and patient is partially compliant with medications.   PRNs in last 24 hours:  Events / PRNs administered:  -- 7/17 0755 agitation PRNs -- 7/17 1603 hydroxyzine 25 mg - anxiety -- 7/17 1946 agitation PRNS - diphenhydramine/ ativan -- 7/18 0247 and 0815 hydroxyzine for agitation  Psychiatric Team made the following recommendations yesterday: -- Increase Risperdal m-tabs, 3 mg oral dissolvable tablets BID for acute psychosis -- Start Zyprexa 5 mg disintegrating tablet 5 mg Q12 (2000 and 0800)             -- Continue Propranolol 40 mg TID for tachycardia, blood pressure, anxiolysis.             -- Changed PRNs diphenhydramine 50 mg / lorazepam 2 mg / zyprexa 5 mg injection / 10 mg disintegrating tablet  Today on morning interview, met patient this morning with Medical Student M3 Theodoro Grist. Pt reports okay sleep, feeling "okay". Reported good appetite and was able to recall (approximately) what he had eaten. Pt recent recall is improved.   Pt was restless, but not akathisic. He was eager to leave his room, but was able to speak coherently for longer periods of time than yesterday.  Pt was brought an anti-dandruff shampoo from home (Neutrogena T-Gel). Instructed him to put it in the shower, but he simply returned it to his pocket. Patient reported allergy symptoms and showed the dirty air filter from the air  conditioning. Pt declined to have staff come and clean air filter. When he declined, stated that the team would make sure it got cleaned before the next person had the room and he responded, "that's good patient care." Answered patient questions about the asbestos (there is none in this building).  Asked whether he was seeing anything that others could not, he replied "I can see mycelium growing everywhere." Which would be odd if there hadn't been a discussion yesterday about his interest in mushrooms and mycology. He is able to recall pieces of conversations better than would be expected from his overall gross disorganization and inappropriate behaviors.  Sleep: "Slept well" "Oh, I was up and down all night." (staff reports he only slept 2.75 hrs) Appetite:  Good Depression: Denies Anxiety: Denies Auditory Hallucinations: Denies Visual Hallucinations: Denies Paranoia: Denies HI: Denies SI: Denies Side effects from medications: does not endorse any side-effects they attribute to medications. Other concerns discussed with patient:  Total time spent with patient: 30 minutes  Past psychiatric history: Diagnosed with schizophrenia in 2022 after previous hospitalization for psychosis. Pt has experienced past psychotic episodes in the summer months and depressive symptoms in the fall. Two total prior psychiatric hospitalizations in the past, with the most recent in 2022 for bizarre behavior and psychosis.   Past Medical History:  Past Medical History:  Diagnosis Date   Anxiety    Low testosterone in male    Paranoid schizophrenia (HCC)     History reviewed. No pertinent surgical history. Family  History: History reviewed. No pertinent family history. Family Psychiatric History: fmhx of bipolar disorder in patient's aunt well-controlled on lithium.  Social History:  Social History   Substance and Sexual Activity  Alcohol Use Not Currently     Social History   Substance and Sexual  Activity  Drug Use Yes   Types: Other-see comments    Social History   Socioeconomic History   Marital status: Single    Spouse name: Not on file   Number of children: Not on file   Years of education: Not on file   Highest education level: Not on file  Occupational History   Not on file  Tobacco Use   Smoking status: Never   Smokeless tobacco: Never  Substance and Sexual Activity   Alcohol use: Not Currently   Drug use: Yes    Types: Other-see comments   Sexual activity: Never  Other Topics Concern   Not on file  Social History Narrative   Not on file   Social Determinants of Health   Financial Resource Strain: Not on file  Food Insecurity: Patient Declined (04/19/2023)   Hunger Vital Sign    Worried About Running Out of Food in the Last Year: Patient declined    Ran Out of Food in the Last Year: Patient declined  Transportation Needs: Patient Declined (04/19/2023)   PRAPARE - Administrator, Civil Service (Medical): Patient declined    Lack of Transportation (Non-Medical): Patient declined  Physical Activity: Not on file  Stress: Not on file  Social Connections: Not on file   Additional Social History:   Collateral obtained from: mother on 7/16- see H&P  Current Medications: Current Facility-Administered Medications  Medication Dose Route Frequency Provider Last Rate Last Admin   acetaminophen (TYLENOL) tablet 650 mg  650 mg Oral Q6H PRN Eligha Bridegroom, NP       alum & mag hydroxide-simeth (MAALOX/MYLANTA) 200-200-20 MG/5ML suspension 30 mL  30 mL Oral Q4H PRN Eligha Bridegroom, NP       chlorproMAZINE (THORAZINE) tablet 100 mg  100 mg Oral QHS Margaretmary Dys, MD       chlorproMAZINE (THORAZINE) tablet 25 mg  25 mg Oral BID Margaretmary Dys, MD       cloNIDine (CATAPRES) tablet 0.1 mg  0.1 mg Oral Q4H PRN Margaretmary Dys, MD       LORazepam (ATIVAN) tablet 2 mg  2 mg Oral TID PRN Phineas Inches, MD    2 mg at 04/21/23 1946   And   diphenhydrAMINE (BENADRYL) capsule 50 mg  50 mg Oral TID PRN Patrece Tallie, Harrold Donath, MD       diphenhydrAMINE (BENADRYL) injection 50 mg  50 mg Intramuscular TID PRN Phineas Inches, MD   50 mg at 04/21/23 1947   hydrOXYzine (ATARAX) tablet 25 mg  25 mg Oral TID PRN Phineas Inches, MD   25 mg at 04/22/23 0815   magnesium hydroxide (MILK OF MAGNESIA) suspension 30 mL  30 mL Oral Daily PRN Eligha Bridegroom, NP       OLANZapine (ZYPREXA) injection 5 mg  5 mg Intramuscular TID PRN Anthone Prieur, Harrold Donath, MD   5 mg at 04/21/23 1946   OLANZapine zydis (ZYPREXA) disintegrating tablet 10 mg  10 mg Oral TID PRN Phineas Inches, MD       propranolol (INDERAL) tablet 40 mg  40 mg Oral TID Margaretmary Dys, MD   40 mg at 04/22/23 0815   risperiDONE (RISPERDAL M-TABS)  disintegrating tablet 3 mg  3 mg Oral BID Telly Broberg, Harrold Donath, MD   3 mg at 04/22/23 0816   traZODone (DESYREL) tablet 50 mg  50 mg Oral QHS PRN Phineas Inches, MD   50 mg at 04/21/23 2134    Lab Results:  No results found for this or any previous visit (from the past 48 hour(s)).  Blood Alcohol level:  Lab Results  Component Value Date   ETH <10 04/18/2023   ETH <10 07/08/2021    Metabolic Disorder Labs: Lab Results  Component Value Date   HGBA1C 5.5 06/11/2022   Lab Results  Component Value Date   PROLACTIN 22.3 (H) 09/11/2022   No results found for: "CHOL", "TRIG", "HDL", "CHOLHDL", "VLDL", "LDLCALC"  Physical Findings: AIMS: Facial and Oral Movements Muscles of Facial Expression: None, normal Lips and Perioral Area: None, normal Jaw: None, normal Tongue: None, normal,Extremity Movements Upper (arms, wrists, hands, fingers): None, normal Lower (legs, knees, ankles, toes): None, normal, Trunk Movements Neck, shoulders, hips: None, normal, Overall Severity Severity of abnormal movements (highest score from questions above): None, normal Incapacitation due to abnormal  movements: None, normal Patient's awareness of abnormal movements (rate only patient's report): No Awareness, Dental Status Current problems with teeth and/or dentures?: No Does patient usually wear dentures?: No  CIWA:    COWS:     Musculoskeletal: Strength & Muscle Tone: within normal limits Gait & Station: normal Patient leans: N/A  Psychiatric Specialty Exam:  Presentation  General Appearance:   Casual   Eye Contact:  Fleeting  Speech:  Clear and Coherent; Normal Rate  Speech Volume:  Normal  Handedness:  Right    Mood and Affect  Mood:  Euthymic  Affect:  Non-Congruent; Inappropriate; Flat   Thought Process  Thought Processes:  Disorganized  Descriptions of Associations: Loose  Orientation: Full (Time, Place and Person)  Thought Content: Delusions; Scattered; Tangential  History of Schizophrenia/Schizoaffective disorder: Yes  Duration of Psychotic Symptoms: Greater than six months  Hallucinations: Hallucinations: None  Ideas of Reference: Other (comment)  Suicidal Thoughts: Suicidal Thoughts: No  Homicidal Thoughts: Homicidal Thoughts: No    Sensorium  Memory: Immediate Good; Recent Good; Remote Good   Judgment:  Fair   Insight:  Poor    Executive Functions  Concentration:  Poor  Attention Span:  Poor  Recall:  Good  Fund of Knowledge:  Fair  Language:  Good  Psychomotor Activity  Psychomotor Activity:  Psychomotor Activity: Normal AIMS Completed?: Yes   Physical Exam: Physical Exam Vitals and nursing note reviewed. Exam conducted with a chaperone present.  Constitutional:      Appearance: He is obese.  HENT:     Head: Normocephalic and atraumatic.  Pulmonary:     Effort: Pulmonary effort is normal.  Skin:    General: Skin is warm and dry.  Neurological:     Mental Status: He is alert.     Cranial Nerves: No cranial nerve deficit.     Motor: No weakness.     Deep Tendon Reflexes: Reflexes normal.     Review of Systems  Constitutional:  Negative for malaise/fatigue.  Gastrointestinal:  Negative for constipation, diarrhea, nausea and vomiting.  Musculoskeletal:  Positive for back pain.  Neurological:  Negative for tremors, sensory change and headaches.  Psychiatric/Behavioral:  Negative for depression, hallucinations and suicidal ideas. The patient has insomnia. The patient is not nervous/anxious.     Blood pressure 137/66, pulse 97, temperature 98.6 F (37 C), temperature source Oral, resp. rate 16,  height 6\' 5"  (1.956 m), weight (!) 171 kg, SpO2 99%. Body mass index is 44.71 kg/m.  ASSESSMENT: Matthew Padilla is a 16 y M with past psychiatric history of schizophrenia, paranoid type and past medical history of hypogonadism. He presented to [facility] ED for tachycardia and found to have mild rhabdomyolysis. Patient was medically stabilized and admitted to inpatient [facility] under IVC for possible exacerbation of schizophrenia.    Patient accurately states the name of his outpatient psychiatrist Dr. Kathryne Sharper. He notes his last visit was several months ago although on chart review his last virtual follow up was on 7/3. At the time, patient was reportedly taking Zoloft 50 mg and Risperdal 1 mg as per Dr. Clarise Cruz note, but patient denies taking all medications for the last several months as they were not working for him. He has had 2 prior psychiatric hospitalizations in the past, with the most recent in 2022 for bizarre behavior and psychosis.    7/18 Pt on exam this morning was still disorganized with scattered thought processes and thought content. He is no longer acutely agitated, but continues to need frequent redirection from staff due to his habit of invading the personal spaces of others. He was disruptive on the unit at night and repeated the "[employer], Open Up!" To another patient's room.  Patient punched the ceiling overnight and continues to demonstrate impulsive behavior.  Patient was redirected that this is inappropriate behavior.  A key to his remaining well-adjusted in the outside world will be getting through to him about how psychoactive mushrooms not regulated or tested and how much this illicit drug use puts him at risk for decompensation.  Diagnoses / Active Problems: -- Schizophrenia vs Schizoaffective Disorder -- Substance-Induced Psychosis  PLAN: Safety and Monitoring:  -- In-Voluntary admission to inpatient psychiatric unit for safety, stabilization and treatment  -- Daily contact with patient to assess and evaluate symptoms and progress in treatment  -- Patient's case to be discussed in multi-disciplinary team meeting  -- Observation Level : q15 minute checks  -- Vital signs:  q12 hours  -- Precautions: suicide, elopement, and assault  2. Psychiatric Diagnoses and Treatment:  -- Increase Risperdal m-tabs, 3 mg oral dissolvable tablets BID for acute psychosis -- Start thorazine 25 mg disintegrating tablet at 0700, 1500 (first dose given at 8 am this morning) daily for psychosis -- Start thorazine 100 mg tablet at 2200 daily for psychosis             -- Continue Propranolol 40 mg TID for tachycardia, blood pressure, anxiolysis.             -- Changed PRNs diphenhydramine 50 mg / lorazepam 2 mg / zyprexa 5 mg injection / 10 mg disintegrating tablet  --  The risks/benefits/side-effects/alternatives to this medication were discussed in detail with the patient and time was given for questions. The patient consents to medication trial.   -- Metabolic profile and EKG monitoring obtained while on an atypical antipsychotic (Body mass index is 44.71 kg/m., Lipid Panel: ,HbgA1c: , QTc:435 on 7/16)   -- Encouraged patient to participate in unit milieu and in scheduled group therapies   -- Short Term Goals: Ability to demonstrate self-control will improve and Ability to identify triggers associated with substance abuse/mental health issues will improve  --  Long Term Goals: Improvement in symptoms so as ready for discharge    3. Medical Issues Being Addressed:   Labs reviewed, notable for recovering rhabdomyolysis, mild troponin leak, elevated  AST, positive for THC.   4. Discharge Planning:   -- Social work and case management to assist with discharge planning and identification of hospital follow-up needs prior to discharge  -- Estimated LOS: 5-7 days  -- Discharge Concerns: Need to establish a safety plan; Medication compliance and effectiveness  -- Discharge Goals: Return home with outpatient referrals for mental health follow-up including medication management/psychotherapy   Margaretmary Dys, MD 04/22/2023, 10:33 AM  Total Time Spent in Direct Patient Care:  I personally spent 35 minutes on the unit in direct patient care. The direct patient care time included face-to-face time with the patient, reviewing the patient's chart, communicating with other professionals, and coordinating care. Greater than 50% of this time was spent in counseling or coordinating care with the patient regarding goals of hospitalization, psycho-education, and discharge planning needs.  I have independently evaluated the patient during a face-to-face assessment. I reviewed the patient's chart, and I participated in key portions of the service. I discussed the case with the Automotive engineer, and I agree with the assessment and plan of care as documented in the House Officer's note, as addended by me or notated below:  Pt still agitated, punched hole in ceiling overnight. Has poor sleep. Is psychotic.  Will change from zyrpexa to thorazine. Will continue risperdal which previously worked well for the pt Will change PRN from zyprexa to thorazine as well to consolidate anitpychotics to total # of 2 Will incr trazodone to 200 mg at bedtime PRN   Phineas Inches, MD Psychiatrist

## 2023-04-22 NOTE — Progress Notes (Signed)
Patient witnessed in bedroom, no distress noted. Remains on 1:1 for safety.

## 2023-04-22 NOTE — Group Note (Signed)
Occupational Therapy Group Note  Group Topic:Coping Skills  Group Date: 04/22/2023 Start Time: 1430 End Time: 1500 Facilitators: Ted Mcalpine, OT   Group Description: Group encouraged increased engagement and participation through discussion and activity focused on "Coping Ahead." Patients were split up into teams and selected a card from a stack of positive coping strategies. Patients were instructed to act out/charade the coping skill for other peers to guess and receive points for their team. Discussion followed with a focus on identifying additional positive coping strategies and patients shared how they were going to cope ahead over the weekend while continuing hospitalization stay.  Therapeutic Goal(s): Identify positive vs negative coping strategies. Identify coping skills to be used during hospitalization vs coping skills outside of hospital/at home Increase participation in therapeutic group environment and promote engagement in treatment   Participation Level: Hyperverbal   Participation Quality: Minimal Cues   Behavior: Bizarre   Speech/Thought Process: Tangential    Affect/Mood: Full range   Insight: Limited   Judgement: Limited      Modes of Intervention: Education  Patient Response to Interventions:  Disengaged   Plan: Continue to engage patient in OT groups 2 - 3x/week.  04/22/2023  Ted Mcalpine, OT Kerrin Champagne, OT

## 2023-04-22 NOTE — Progress Notes (Signed)
   04/22/23 1018  Psych Admission Type (Psych Patients Only)  Admission Status Involuntary  Psychosocial Assessment  Patient Complaints Anxiety;Agitation;Restlessness  Eye Contact Fair  Facial Expression Grimacing  Affect Preoccupied;Irritable;Anxious  Interaction Defensive;Forwards little;Cautious  Motor Activity Ritualistic  Appearance/Hygiene Unremarkable  Behavior Characteristics Cooperative;Wandering risk;Impulsive;Intrusive;Pacing;Irritable;Restless;Posturing  Mood Preoccupied;Irritable  Thought Process  Coherency Disorganized  Content Preoccupation  Delusions None reported or observed  Perception Hallucinations  Hallucination Auditory  Judgment Impaired  Confusion Moderate  Danger to Self  Current suicidal ideation? Denies  Danger to Others  Danger to Others None reported or observed

## 2023-04-22 NOTE — Group Note (Signed)
Date:  04/22/2023 Time:  9:32 AM  Group Topic/Focus:  Goals Group:   The focus of this group is to help patients establish daily goals to achieve during treatment and discuss how the patient can incorporate goal setting into their daily lives to aide in recovery.    Participation Level:  Active  Participation Quality:  Attentive  Affect:  Flat  Cognitive:  Disorganized  Insight: Lacking  Engagement in Group:  Lacking  Modes of Intervention:  Clarification, Confrontation, Limit-setting, and Reality Testing  Additional Comments:  Pt stated that his goal for today was to '" Look for some shoes."  Donell Beers 04/22/2023, 9:32 AM

## 2023-04-22 NOTE — Progress Notes (Signed)
The patient is awake and has been making noise in his bedroom for more than an hour. He is adjusting the bed and trying to remove trash from the room. In addition, he can be heard arguing with the assigned sitter.

## 2023-04-22 NOTE — Progress Notes (Signed)
Patient  is calm and sleeping in bed following medication administration, No signs of distress noted or agitation observed.  Will continue to monitor.  1:1 staff present at bedside.

## 2023-04-22 NOTE — Plan of Care (Signed)
  Problem: Education: Goal: Verbalization of understanding the information provided will improve Outcome: Progressing   

## 2023-04-23 DIAGNOSIS — F203 Undifferentiated schizophrenia: Secondary | ICD-10-CM | POA: Diagnosis not present

## 2023-04-23 MED ORDER — RISPERIDONE 2 MG PO TBDP
4.0000 mg | ORAL_TABLET | Freq: Two times a day (BID) | ORAL | Status: DC
  Administered 2023-04-23 – 2023-05-06 (×26): 4 mg via ORAL
  Filled 2023-04-23 (×30): qty 2

## 2023-04-23 MED ORDER — PROPRANOLOL HCL ER 60 MG PO CP24
120.0000 mg | ORAL_CAPSULE | Freq: Every day | ORAL | Status: DC
  Administered 2023-04-24 – 2023-04-29 (×6): 120 mg via ORAL
  Filled 2023-04-23: qty 1
  Filled 2023-04-23 (×6): qty 2

## 2023-04-23 MED ORDER — PROPRANOLOL HCL 20 MG PO TABS
40.0000 mg | ORAL_TABLET | Freq: Three times a day (TID) | ORAL | Status: AC
  Administered 2023-04-23: 40 mg via ORAL
  Filled 2023-04-23 (×2): qty 1

## 2023-04-23 MED ORDER — METFORMIN HCL ER 500 MG PO TB24
500.0000 mg | ORAL_TABLET | Freq: Every day | ORAL | Status: DC
  Administered 2023-04-24 – 2023-05-06 (×13): 500 mg via ORAL
  Filled 2023-04-23 (×15): qty 1

## 2023-04-23 MED ORDER — CHLORPROMAZINE HCL 50 MG PO TABS
50.0000 mg | ORAL_TABLET | Freq: Two times a day (BID) | ORAL | Status: DC
  Administered 2023-04-23 – 2023-04-26 (×6): 50 mg via ORAL
  Filled 2023-04-23 (×8): qty 1

## 2023-04-23 NOTE — Group Note (Signed)
Date:  04/23/2023 Time:  8:29 PM  Group Topic/Focus:  Wrap-Up Group:   The focus of this group is to help patients review their daily goal of treatment and discuss progress on daily workbooks.    Participation Level:  Active  Participation Quality:  Appropriate  Affect:  Appropriate  Cognitive:  Appropriate  Insight: Appropriate  Engagement in Group:  Engaged  Modes of Intervention:  Education and Exploration  Additional Comments:  Patient attended and participated in group tonight. He reports that he had a successful and productive day. He is looking forward to go home next week.  Lita Mains [facility] 04/23/2023, 8:29 PM

## 2023-04-23 NOTE — Progress Notes (Signed)
1:1 Note Pt has been observed very restless, keep on turning A/C on and off, pushing the STARR button. In ans out of bed constantly and redirection seems not be working for him. Pt is medicated with Ativan 2 mg, Vistaril 25 mg and 200 mg of Trazodone all PO. Pt encouraged to lay down and try to go sleep. Staff remains in with the pt, will continue to monitor.

## 2023-04-23 NOTE — [facility] Suicide Risk Assessment (Signed)
[facility] INPATIENT:  Family/Significant Other Suicide Prevention Education  Suicide Prevention Education:  Education Completed; mom joy Molnar [phone],  (name of family member/significant other) has been identified by the patient as the family member/significant other with whom the patient will be residing, and identified as the person(s) who will aid the patient in the event of a mental health crisis (suicidal ideations/suicide attempt).  With written consent from the patient, the family member/significant other has been provided the following suicide prevention education, prior to the and/or following the discharge of the patient.  Safety planning was completed with mom. 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 Mom shared that he uses gummies and mushroom and it all stated when he went to visit his sister where marijuana is legal and after using them he began to lash out and have outburst . Mom confirms that she will pick patient up once he is DC and confirms that he has no access to any guns or weapons.   The suicide prevention education provided includes the following: Suicide risk factors Suicide prevention and interventions National Suicide Hotline telephone number [facility] assessment telephone number [facility] Emergency Assistance [address] [facility] and/or Residential Mobile Crisis Unit telephone number  Request made of family/significant other to: Remove weapons (e.g., guns, rifles, knives), all items previously/currently identified as safety concern.   Remove drugs/medications (over-the-counter, prescriptions, illicit drugs), all items previously/currently identified as a safety concern.  The family member/significant other verbalizes understanding of the suicide prevention education information provided.  The family member/significant other agrees to remove the  items of safety concern listed above.  Isabella Bowens 04/23/2023, 2:30 PM

## 2023-04-23 NOTE — [facility] Group Notes (Signed)
Spirituality group facilitated by Kathleen Argue, [facility].  Group Description: Group focused on topic of hope. Patients participated in facilitated discussion around topic, connecting with one another around experiences and definitions for hope.  Due to lack of participants, group was ended early.   Modalities: Psycho-social ed, Adlerian, Narrative, MI  Patient Progress: Matthew Padilla attended group.  His responses to questions were not on topic and he seemed confused about where he was.

## 2023-04-23 NOTE — Progress Notes (Signed)
   04/23/23 0655  15 Minute Checks  Location Bedroom  Visual Appearance Calm  Behavior Sleeping  Sleep (Behavioral Health Patients Only)  Calculate sleep? (Click Yes once per 24 hr at 0600 safety check) Yes  Documented sleep last 24 hours 8.25

## 2023-04-23 NOTE — Progress Notes (Signed)

## 2023-04-23 NOTE — Group Note (Signed)
Recreation Therapy Group Note   Group Topic:Communication  Group Date: 04/23/2023 Start Time: 1035 End Time: 1100 Facilitators: Odus Clasby-McCall, LRT,CTRS Location: [address] Dayroom   Goal Area(s) Addresses:  Patient will effectively listen to complete activity.  Patient will identify communication skills used to make activity successful.  Patient will identify how skills used during activity can be used to reach post d/c goals.    Group Description: Geometric Drawings.  Three volunteers from the peer group will be shown an abstract picture with a particular arrangement of geometrical shapes.  Each round, one 'speaker' will describe the pattern, as accurately as possible without revealing the image to the group.  The remaining group members will listen and draw the picture to reflect how it is described to them. Patients with the role of 'listener' cannot ask clarifying questions but, may request that the speaker repeat a direction. Once the drawings are complete, the presenter will show the rest of the group the picture and compare how close each person came to drawing the picture. LRT will facilitate a post-activity discussion regarding effective communication and the importance of planning, listening, and asking for clarification in daily interactions with others.   Affect/Mood: N/A   Participation Level: Did not attend    Clinical Observations/Individualized Feedback:     Plan: Continue to engage patient in RT group sessions 2-3x/week.   Jerrad Mendibles-McCall, LRT,CTRS 04/23/2023 1:07 PM

## 2023-04-23 NOTE — Progress Notes (Signed)
[facility] MD Progress Note  04/23/2023 10:20 AM Matthew Padilla  MRN:  [id number] Principal Problem: Schizophrenia (HCC) Diagnosis: Principal Problem:   Schizophrenia (HCC)   Subjective:   Matthew Padilla is a 27 year-old male with past psychiatric history of schizophrenia, paranoid type and past medical history of hypogonadism. He presented to [facility] ED for psychosis and tachycardia and found to have mild rhabdomyolysis. Patient was medically stabilized and admitted to inpatient [facility] under IVC for possible exacerbation of schizophrenia.   Case was discussed in the multidisciplinary team. MAR was reviewed and patient was compliant with medications.   PRNs in the last 24 hours: tylenol x 1, hydroxyzine x 1, trazodone x 1  Psychiatric Team made the following recommendations yesterday: -- Increase Risperdal m-tabs, 3 mg oral dissolvable tablets BID for acute psychosis -- Start thorazine 25 mg disintegrating tablet at 0700, 1500 (first dose given at 8 am this morning) daily for psychosis -- Start thorazine 100 mg tablet at 2200 daily for psychosis             -- Continue Propranolol 40 mg TID for tachycardia, blood pressure, anxiolysis.             -- Changed PRNs diphenhydramine 50 mg / lorazepam 2 mg / zyprexa 5 mg injection / 10 mg disintegrating tablet  Today on morning interview, patient reports feeling "pretty good." He states he slept well, but responds "1 hour" when asked how many hours he was asleep (nursing staff report 8.5 hours). He said his appetite is good, "I could eat a horse." When asked what he has eaten recently, he reports eating "two pieces of bacon" for supper and "one piece of sausage" for breakfast. Overall, patient's recent recall is poor. He describes his mood as "live from [place]," though he incorporated several other quotations from into the conversation. His affect remains constricted.  Patient also remains restless, but he is not akathisic. He walked around the room,  looked out the window, and adjusted the temperature on the air conditioner several times throughout the interview. He removed all the sheets and pillows from his bed this morning, and he was eager to leave his room to walk the hallways. He was able to stay engaged in the conversation despite his restlessness.  Regarding visual hallucinations, patient reports seeing a black dot that moves around the room. When asked where the black dot is now, he responds "in my mouth," so it is unclear whether he is actually experiencing visual hallucinations. He denies auditory hallucinations, suicidal thoughts, or homicidal ideation.   Regarding medication side effects, patient reports the medicines are impacting his "equilibrium." Patient was unable to clarify the meaning of this, but his gait/balance appears normal. He denies any muscle stiffness or involuntary muscle movements.  Sleep: Good Appetite:  Good Depression: Denies Anxiety: Denies Auditory Hallucinations: Denies Visual Hallucinations: Unclear - "black dot that moves" Paranoia: Denies UX:LKGMWN SI: Denies Side effects from medications: does not endorse any side-effects they attribute to medications.  Total time spent with patient: 20 minutes  Past psychiatric history:  Diagnosed with schizophrenia in 2022 after previous hospitalization for psychosis. Pt has experienced past psychotic episodes in the summer months and depressive symptoms in the fall. Two total prior psychiatric hospitalizations in the past, with the most recent in 2022 for bizarre behavior and psychosis.   Past Medical History:  Past Medical History:  Diagnosis Date   Anxiety    Low testosterone in male    Paranoid schizophrenia (HCC)  History reviewed. No pertinent surgical history. Family History: History reviewed. No pertinent family history. Family Psychiatric History:  fmhx of bipolar disorder in patient's aunt well-controlled on lithium.   Social History:   Social History   Substance and Sexual Activity  Alcohol Use Not Currently     Social History   Substance and Sexual Activity  Drug Use Yes   Types: Other-see comments    Social History   Socioeconomic History   Marital status: Single    Spouse name: Not on file   Number of children: Not on file   Years of education: Not on file   Highest education level: Not on file  Occupational History   Not on file  Tobacco Use   Smoking status: Never   Smokeless tobacco: Never  Substance and Sexual Activity   Alcohol use: Not Currently   Drug use: Yes    Types: Other-see comments   Sexual activity: Never  Other Topics Concern   Not on file  Social History Narrative   Not on file   Social Determinants of Health   Financial Resource Strain: Not on file  Food Insecurity: Patient Declined (04/19/2023)   Hunger Vital Sign    Worried About Running Out of Food in the Last Year: Patient declined    Ran Out of Food in the Last Year: Patient declined  Transportation Needs: Patient Declined (04/19/2023)   PRAPARE - Administrator, Civil Service (Medical): Patient declined    Lack of Transportation (Non-Medical): Patient declined  Physical Activity: Not on file  Stress: Not on file  Social Connections: Not on file    Current Medications: Current Facility-Administered Medications  Medication Dose Route Frequency Provider Last Rate Last Admin   acetaminophen (TYLENOL) tablet 650 mg  650 mg Oral Q6H PRN Eligha Bridegroom, NP   650 mg at 04/22/23 1145   alum & mag hydroxide-simeth (MAALOX/MYLANTA) 200-200-20 MG/5ML suspension 30 mL  30 mL Oral Q4H PRN Eligha Bridegroom, NP       chlorproMAZINE (THORAZINE) injection 25 mg  25 mg Intramuscular TID PRN Massengill, Harrold Donath, MD       chlorproMAZINE (THORAZINE) tablet 100 mg  100 mg Oral QHS Margaretmary Dys, MD   100 mg at 04/22/23 2012   chlorproMAZINE (THORAZINE) tablet 25 mg  25 mg Oral BID Margaretmary Dys, MD   25 mg at 04/23/23 0981   chlorproMAZINE (THORAZINE) tablet 50 mg  50 mg Oral TID PRN Phineas Inches, MD       cloNIDine (CATAPRES) tablet 0.1 mg  0.1 mg Oral Q4H PRN Margaretmary Dys, MD       hydrOXYzine (ATARAX) tablet 25 mg  25 mg Oral TID PRN Phineas Inches, MD   25 mg at 04/22/23 2014   LORazepam (ATIVAN) tablet 2 mg  2 mg Oral TID PRN Phineas Inches, MD       Or   LORazepam (ATIVAN) injection 2 mg  2 mg Intramuscular TID PRN Massengill, Harrold Donath, MD       magnesium hydroxide (MILK OF MAGNESIA) suspension 30 mL  30 mL Oral Daily PRN Eligha Bridegroom, NP       propranolol (INDERAL) tablet 40 mg  40 mg Oral TID Margaretmary Dys, MD   40 mg at 04/23/23 0829   risperiDONE (RISPERDAL M-TABS) disintegrating tablet 3 mg  3 mg Oral BID Massengill, Harrold Donath, MD   3 mg at 04/23/23 0828   traZODone (DESYREL) tablet 200 mg  200 mg Oral QHS PRN Phineas Inches, MD   200 mg at 04/22/23 2012    Lab Results:  Results for orders placed or performed during the hospital encounter of 04/19/23 (from the past 48 hour(s))  CK     Status: Abnormal   Collection Time: 04/22/23  7:16 PM  Result Value Ref Range   Total CK 937 (H) 49 - 397 U/L    Comment: Performed at [facility], [address], [place], [place] [address]  Lipid panel     Status: Abnormal   Collection Time: 04/22/23  7:16 PM  Result Value Ref Range   Cholesterol 126 0 - 200 mg/dL   Triglycerides 616 <073 mg/dL   HDL 21 (L) >71 mg/dL   Total CHOL/HDL Ratio 6.0 RATIO   VLDL 23 0 - 40 mg/dL   LDL Cholesterol 82 0 - 99 mg/dL    Comment:        Total Cholesterol/HDL:CHD Risk Coronary Heart Disease Risk Table                     Men   Women  1/2 Average Risk   3.4   3.3  Average Risk       5.0   4.4  2 X Average Risk   9.6   7.1  3 X Average Risk  23.4   11.0        Use the calculated Patient Ratio above and the CHD Risk Table to determine the patient's CHD  Risk.        ATP III CLASSIFICATION (LDL):  <100     mg/dL   Optimal  062-694  mg/dL   Near or Above                    Optimal  130-159  mg/dL   Borderline  854-627  mg/dL   High  >035     mg/dL   Very High Performed at [facility], [address] [place], [place] [address]   Comprehensive metabolic panel     Status: Abnormal   Collection Time: 04/22/23  7:16 PM  Result Value Ref Range   Sodium 132 (L) 135 - 145 mmol/L   Potassium 3.4 (L) 3.5 - 5.1 mmol/L   Chloride 103 98 - 111 mmol/L   CO2 19 (L) 22 - 32 mmol/L   Glucose, Bld 115 (H) 70 - 99 mg/dL    Comment: Glucose reference range applies only to samples taken after fasting for at least 8 hours.   BUN 14 6 - 20 mg/dL   Creatinine, Ser 1.82 0.61 - 1.24 mg/dL   Calcium 8.3 (L) 8.9 - 10.3 mg/dL   Total Protein 6.9 6.5 - 8.1 g/dL   Albumin 4.1 3.5 - 5.0 g/dL   AST 53 (H) 15 - 41 U/L   ALT 59 (H) 0 - 44 U/L   Alkaline Phosphatase 50 38 - 126 U/L   Total Bilirubin 1.0 0.3 - 1.2 mg/dL   GFR, Estimated >99 >37 mL/min    Comment: (NOTE) Calculated using the CKD-EPI Creatinine Equation (2021)    Anion gap 10 5 - 15    Comment: Performed at [facility], [address] [place], [place] [address]    Blood Alcohol level:  Lab Results  Component Value Date   [facility] <10 04/18/2023   ETH <10 07/08/2021    Metabolic Disorder Labs: Lab Results  Component Value Date  HGBA1C 5.5 06/11/2022   Lab Results  Component Value Date   PROLACTIN 22.3 (H) 09/11/2022   Lab Results  Component Value Date   CHOL 126 04/22/2023   TRIG 113 04/22/2023   HDL 21 (L) 04/22/2023   CHOLHDL 6.0 04/22/2023   VLDL 23 04/22/2023   LDLCALC 82 04/22/2023    Physical Findings: AIMS: Facial and Oral Movements Muscles of Facial Expression: None, normal Lips and Perioral Area: None, normal Jaw: None, normal Tongue: None, normal,Extremity Movements Upper (arms, wrists, hands, fingers): None, normal Lower  (legs, knees, ankles, toes): None, normal, Trunk Movements Neck, shoulders, hips: None, normal, Overall Severity Severity of abnormal movements (highest score from questions above): None, normal Incapacitation due to abnormal movements: None, normal Patient's awareness of abnormal movements (rate only patient's report): No Awareness, Dental Status Current problems with teeth and/or dentures?: No Does patient usually wear dentures?: No   Musculoskeletal: Strength & Muscle Tone: within normal limits Gait & Station: normal Patient leans: N/A  Psychiatric Specialty Exam:  Presentation  General Appearance:   Appropriate for Environment; Well Groomed   Eye Contact:  Fleeting  Speech:  Clear and Coherent; Slow  Speech Volume:  Normal  Handedness:  Right    Mood and Affect  Mood:  -- ("Live from [place]")  Affect:  Constricted   Thought Process  Thought Processes:  Disorganized  Descriptions of Associations: Tangential  Orientation: Full (Time, Place and Person)  Thought Content: Scattered  History of Schizophrenia/Schizoaffective disorder: Yes  Duration of Psychotic Symptoms: Greater than six months  Hallucinations: Hallucinations: -- (Patient denies auditory hallcuinations. Reports seeing a "black dot that moves," but it is unclear whether this is a true visual hallucination as the reported location of the dot is "in my mouth.")  Ideas of Reference: None  Suicidal Thoughts: Suicidal Thoughts: No  Homicidal Thoughts: Homicidal Thoughts: No    Sensorium  Memory: Remote Poor; Immediate Poor; Recent Poor   Judgment:  Poor   Insight:  Poor    Executive Functions  Concentration:  Poor  Attention Span:  Poor  Recall:  Poor  Fund of Knowledge:  Poor  Language:  Poor  Psychomotor Activity  Psychomotor Activity:  Psychomotor Activity: Normal AIMS Completed?: Yes (Score = 0)   Physical Exam: Physical Exam Vitals and nursing note  reviewed. Exam conducted with a chaperone present.  Constitutional:      General: He is awake. He is not in acute distress.    Appearance: He is obese.  HENT:     Head: Normocephalic and atraumatic.  Pulmonary:     Effort: Pulmonary effort is normal. No respiratory distress.  Musculoskeletal:        General: Normal range of motion.     Cervical back: Normal range of motion.  Neurological:     Mental Status: He is alert.     Motor: Motor function is intact.     Gait: Gait is intact.     Comments: Bilateral elbow flexion and extension 5/5, bilateral hip flexion and extension 5/5, bilateral knee flexion and extension 5/5  Psychiatric:        Behavior: Behavior is cooperative.     Review of Systems  Respiratory:  Negative for shortness of breath.   Cardiovascular:  Negative for chest pain.  Gastrointestinal:  Negative for abdominal pain, nausea and vomiting.  Musculoskeletal:  Negative for falls.  Neurological:  Negative for tremors.  Psychiatric/Behavioral:  Positive for hallucinations. Negative for depression and suicidal ideas. The patient is  not nervous/anxious and does not have insomnia.     Blood pressure (!) 129/118, pulse (!) 110, temperature 98.6 F (37 C), temperature source Oral, resp. rate 16, height 6\' 5"  (1.956 m), weight (!) 171 kg, SpO2 99%. Body mass index is 44.71 kg/m.   Treatment Plan  ASSESSMENT: Matthew Padilla is a 27 year-old male with past psychiatric history of schizophrenia, paranoid type and past medical history of hypogonadism. He presented to [facility] ED for tachycardia and found to have mild rhabdomyolysis. Patient was medically stabilized and admitted to inpatient [facility] under IVC for possible exacerbation of schizophrenia.    Patient accurately states the name of his outpatient psychiatrist Dr. Kathryne Sharper. He notes his last visit was several months ago although on chart review his last virtual follow up was on 7/3. At the time, patient was  reportedly taking Zoloft 50 mg and Risperdal 1 mg as per Dr. Clarise Cruz note, but patient denies taking all medications for the last several months as they were not working for him. He has had 2 prior psychiatric hospitalizations in the past, with the most recent in 2022 for bizarre behavior and psychosis.   7/19: Patient remains disorganized with scattered thought process and thought content on exam this morning. His behavior has improved as he did not receive any PRN medications for agitation during the past 24 hours. He also slept for 8.5 hours and did not disturb other patients on the unit throughout the night. There seem to be no adverse effects from the medications, including extrapyramidal symptoms like akathisia and pseudoparkinsonism.   A key to his remaining well-adjusted in the outside world will be getting through to him about how psychoactive mushrooms are not regulated or tested and how much this illicit drug use puts him at risk for decompensation.   Diagnoses / Active Problems: -- Schizophrenia vs Schizoaffective Disorder -- Substance-Induced Psychosis   PLAN: Safety and Monitoring:             -- In-Voluntary admission to inpatient psychiatric unit for safety, stabilization and treatment             -- Daily contact with patient to assess and evaluate symptoms and progress in treatment             -- Patient's case to be discussed in multi-disciplinary team meeting             -- Observation Level : q15 minute checks             -- Vital signs:  q12 hours             -- Precautions: suicide, elopement, and assault   2. Psychiatric Diagnoses and Treatment:  -- Increase Risperdal m-tabs, 4 mg oral dissolvable tablets BID for acute psychosis -- Increase thorazine 50 mg disintegrating tablet at 0700, 1500 (first dose scheduled for 1500 on 7/19) daily for psychosis -- Continue thorazine 100 mg tablet at 2200 daily for psychosis             -- Change Propranolol to ER 120 mg daily for  tachycardia, blood pressure, anxiolysis.             -- Change PRNs diphenhydramine 50 mg / lorazepam 2 mg / thorazine 25 mg injection / 50 mg oral tablet             -- The risks/benefits/side-effects/alternatives to this medication were discussed in detail with the patient and time was given for questions. The patient consents  to medication trial.              -- Metabolic profile and EKG monitoring obtained while on an atypical antipsychotic (Body mass index is 44.71 kg/m, Lipid Panel: HDL 21, LDL 82, triglycerides 113, total 126, HbgA1c: pending, QTc:435 on 7/16)              -- Encouraged patient to participate in unit milieu and in scheduled group therapies              -- Short Term Goals: Ability to demonstrate self-control will improve and Ability to identify triggers associated with substance abuse/mental health issues will improve             -- Long Term Goals: Improvement in symptoms so as ready for discharge               3. Medical Issues Being Addressed:  -- Labs reviewed, notable for recovering rhabdomyolysis, mild troponin leak, elevated AST, positive for THC.   -- Elevated blood glucose: Start metformin XR 500 mg once in the morning   4. Discharge Planning:              -- Social work and case management to assist with discharge planning and identification of hospital follow-up needs prior to discharge             -- Estimated LOS: 5-7 days             -- Discharge Concerns: Need to establish a safety plan; Medication compliance and effectiveness             -- Discharge Goals: Return home with outpatient referrals for mental health follow-up including medication management/psychotherapy  Signed: Christie Nottingham, MD [facility] Physician 04/23/2023 11:29 AM

## 2023-04-23 NOTE — Progress Notes (Signed)
   04/23/23 0829  Psych Admission Type (Psych Patients Only)  Admission Status Involuntary  Psychosocial Assessment  Patient Complaints Anxiety;Restlessness  Eye Contact Brief  Facial Expression Flat  Affect Preoccupied  Speech Argumentative  Interaction Defensive  Motor Activity Pacing;Restless  Appearance/Hygiene Unremarkable  Behavior Characteristics Resistant to care  Mood Preoccupied;Irritable  Thought Process  Coherency Disorganized  Content Preoccupation  Delusions None reported or observed  Perception Hallucinations  Hallucination Auditory  Judgment Impaired  Confusion Moderate  Danger to Self  Current suicidal ideation? Denies  Danger to Others  Danger to Others None reported or observed

## 2023-04-24 DIAGNOSIS — F203 Undifferentiated schizophrenia: Secondary | ICD-10-CM | POA: Diagnosis not present

## 2023-04-24 LAB — HEMOGLOBIN A1C
Hgb A1c MFr Bld: 5.4 % (ref 4.8–5.6)
Mean Plasma Glucose: 108 mg/dL

## 2023-04-24 NOTE — Progress Notes (Incomplete)
1:1 Note

## 2023-04-24 NOTE — Progress Notes (Signed)
1:1 Note:   Pt observed pacing the hallway, needs consistent redirection due to disorganized thoughts. Pt denies SI/HI/AVH. Pt had no additional questions or concerns. Pt was offered PRN vistaril 25 for anxiety. 1:1 maintained for safety. Pt remains safe.

## 2023-04-24 NOTE — Group Note (Signed)
Date:  04/24/2023 Time:  8:34 PM  Group Topic/Focus:  Wrap-Up Group:   The focus of this group is to help patients review their daily goal of treatment and discuss progress on daily workbooks.    Participation Level:  Active  Participation Quality:  Appropriate  Affect:  Appropriate  Cognitive:  Confused  Insight: Lacking  Engagement in Group:  Engaged  Modes of Intervention:  Education and Exploration  Additional Comments:  Patient attended ane participated in group tonight. He reports that his day was a 10 because his mothervisited with him today.  Lita Mains [facility] 04/24/2023, 8:34 PM

## 2023-04-24 NOTE — Progress Notes (Addendum)
1:1 Note:   Pt observed eating dinner. Pt still needs constant redirection. Pt was pushing STARR button in his room. 1:1 maintained for safety. Pt remains safe.

## 2023-04-24 NOTE — Group Note (Unsigned)
Date:  04/24/2023 Time:  9:15 PM  Group Topic/Focus:  Wrap-Up Group:   The focus of this group is to help patients review their daily goal of treatment and discuss progress on daily workbooks.     Participation Level:  {BHH PARTICIPATION WUJWJ:19147}  Participation Quality:  {BHH PARTICIPATION QUALITY:22265}  Affect:  {BHH AFFECT:22266}  Cognitive:  {BHH COGNITIVE:22267}  Insight: {BHH Insight2:20797}  Engagement in Group:  {BHH ENGAGEMENT IN WGNFA:21308}  Modes of Intervention:  {BHH MODES OF INTERVENTION:22269}  Additional Comments:  ***  Scot Dock 04/24/2023, 9:15 PM

## 2023-04-24 NOTE — Progress Notes (Signed)
EKG completed. MD notified.

## 2023-04-24 NOTE — Progress Notes (Signed)
   04/24/23 4132  15 Minute Checks  Location Bedroom  Visual Appearance Calm  Behavior Composed  Sleep (Behavioral Health Patients Only)  Calculate sleep? (Click Yes once per 24 hr at 0600 safety check) Yes  Documented sleep last 24 hours 8.25

## 2023-04-24 NOTE — Plan of Care (Signed)
  Problem: Education: Goal: Emotional status will improve Outcome: Progressing   Problem: Activity: Goal: Interest or engagement in activities will improve Outcome: Progressing   Problem: Safety: Goal: Periods of time without injury will increase Outcome: Progressing

## 2023-04-24 NOTE — Progress Notes (Signed)
1:1 Note Pt observed a sleep at this time with even and unlabored respiration, staff remain at the bedside for safety, will continue to monitor.

## 2023-04-24 NOTE — Plan of Care (Signed)
  Problem: Education: Goal: Knowledge of [facility] Education information/materials will improve Outcome: Progressing   Problem: Activity: Goal: Interest or engagement in activities will improve Outcome: Progressing   Problem: Coping: Goal: Ability to verbalize frustrations and anger appropriately will improve Outcome: Progressing   

## 2023-04-24 NOTE — Progress Notes (Signed)
[facility] MD Progress Note  04/24/2023 12:28 PM Matthew Padilla  MRN:  [id number] Principal Problem: Schizophrenia (HCC) Diagnosis: Principal Problem:   Schizophrenia (HCC)   Subjective:   Matthew Padilla is a 27 year-old male with past psychiatric history of schizophrenia, paranoid type and past medical history of hypogonadism. He presented to [facility] ED for psychosis and tachycardia and found to have mild rhabdomyolysis. Patient was medically stabilized and admitted to inpatient [facility] under IVC for possible exacerbation of schizophrenia.   Case was discussed in the multidisciplinary team. MAR was reviewed and patient was compliant with medications.   PRNs in the last 24 hours:  - 07/19 2122 hydroxyzine 25mg  - 07/19 2123 ativan 2 mg (tablet) - 07/19 2122 trazodone 200 mg  - 07/20 0755 hydroxyzine 25mg   Psychiatric Team made the following recommendations yesterday: -- Increase Risperdal m-tabs, 4 mg oral dissolvable tablets BID for acute psychosis -- Increase thorazine 50 mg disintegrating tablet at 0700, 1500 (first dose scheduled for 1500 on 7/19) daily for psychosis -- Continue thorazine 100 mg tablet at 2200 daily for psychosis             -- Change Propranolol to ER 120 mg daily for tachycardia, blood pressure, anxiolysis.             -- Change PRNs diphenhydramine 50 mg / lorazepam 2 mg / thorazine 25 mg injection / 50 mg oral tablet  Today on morning interview, patient was the calmest he has been during his hospitalization.  Still grossly disorganized at times but is better able to connect conversations and string together sentences in a more appropriate manner.  This morning when asked what he wanted to talk about, he mentioned the legend of [place].  He was able to accurately recall several pieces of the ledge and from [place] mythology.  Because the story involves a splitting headache, questioned him whether he had a headache at this time but he denied.  Patient mentions that he heard Led  Zeppelin music playing earlier, "that's a hell of a band," and he was fairly certain it was not a hallucination.  Unclear whether that is possible.  Patient requested some sweet drinks to be added to his formulary, will comply.  Regarding visual hallucinations, He denies auditory hallucinations, suicidal thoughts, or homicidal ideation.   Regarding medication side effects,   Sleep: Good Appetite:  Good Depression: Denies Anxiety: Denies Auditory Hallucinations: Denies - possible led zeppelin music? Visual Hallucinations: Denies Paranoia: Denies NW:GNFAOZ SI: Denies Side effects from medications: does not endorse any side-effects they attribute to medications.  Total time spent with patient: 20 minutes  Past psychiatric history:  Diagnosed with schizophrenia in 2022 after previous hospitalization for psychosis. Pt has experienced past psychotic episodes in the summer months and depressive symptoms in the fall. Two total prior psychiatric hospitalizations in the past, with the most recent in 2022 for bizarre behavior and psychosis.   Past Medical History:  Past Medical History:  Diagnosis Date   Anxiety    Low testosterone in male    Paranoid schizophrenia (HCC)     History reviewed. No pertinent surgical history. Family History: History reviewed. No pertinent family history. Family Psychiatric History:  fmhx of bipolar disorder in patient's aunt well-controlled on lithium.   Social History:  Social History   Substance and Sexual Activity  Alcohol Use Not Currently     Social History   Substance and Sexual Activity  Drug Use Yes   Types: Other-see comments  Social History   Socioeconomic History   Marital status: Single    Spouse name: Not on file   Number of children: Not on file   Years of education: Not on file   Highest education level: Not on file  Occupational History   Not on file  Tobacco Use   Smoking status: Never   Smokeless tobacco: Never   Substance and Sexual Activity   Alcohol use: Not Currently   Drug use: Yes    Types: Other-see comments   Sexual activity: Never  Other Topics Concern   Not on file  Social History Narrative   Not on file   Social Determinants of Health   Financial Resource Strain: Not on file  Food Insecurity: Patient Declined (04/19/2023)   Hunger Vital Sign    Worried About Running Out of Food in the Last Year: Patient declined    Ran Out of Food in the Last Year: Patient declined  Transportation Needs: Patient Declined (04/19/2023)   PRAPARE - Administrator, Civil Service (Medical): Patient declined    Lack of Transportation (Non-Medical): Patient declined  Physical Activity: Not on file  Stress: Not on file  Social Connections: Not on file    Current Medications: Current Facility-Administered Medications  Medication Dose Route Frequency Provider Last Rate Last Admin   acetaminophen (TYLENOL) tablet 650 mg  650 mg Oral Q6H PRN Eligha Bridegroom, NP   650 mg at 04/22/23 1145   alum & mag hydroxide-simeth (MAALOX/MYLANTA) 200-200-20 MG/5ML suspension 30 mL  30 mL Oral Q4H PRN Eligha Bridegroom, NP       chlorproMAZINE (THORAZINE) injection 25 mg  25 mg Intramuscular TID PRN Massengill, Harrold Donath, MD       chlorproMAZINE (THORAZINE) tablet 100 mg  100 mg Oral QHS Margaretmary Dys, MD   100 mg at 04/23/23 2025   chlorproMAZINE (THORAZINE) tablet 50 mg  50 mg Oral TID PRN Massengill, Harrold Donath, MD       chlorproMAZINE (THORAZINE) tablet 50 mg  50 mg Oral BID Margaretmary Dys, MD   50 mg at 04/24/23 5284   cloNIDine (CATAPRES) tablet 0.1 mg  0.1 mg Oral Q4H PRN Margaretmary Dys, MD       hydrOXYzine (ATARAX) tablet 25 mg  25 mg Oral TID PRN Phineas Inches, MD   25 mg at 04/24/23 0755   LORazepam (ATIVAN) tablet 2 mg  2 mg Oral TID PRN Phineas Inches, MD   2 mg at 04/23/23 2123   Or   LORazepam (ATIVAN) injection 2 mg  2 mg Intramuscular TID  PRN Massengill, Harrold Donath, MD       magnesium hydroxide (MILK OF MAGNESIA) suspension 30 mL  30 mL Oral Daily PRN Eligha Bridegroom, NP       metFORMIN (GLUCOPHAGE-XR) 24 hr tablet 500 mg  500 mg Oral Q breakfast Margaretmary Dys, MD   500 mg at 04/24/23 0753   propranolol ER (INDERAL LA) 24 hr capsule 120 mg  120 mg Oral Daily Margaretmary Dys, MD   120 mg at 04/24/23 0753   risperiDONE (RISPERDAL M-TABS) disintegrating tablet 4 mg  4 mg Oral BID Margaretmary Dys, MD   4 mg at 04/24/23 1324   traZODone (DESYREL) tablet 200 mg  200 mg Oral QHS PRN Phineas Inches, MD   200 mg at 04/23/23 2122    Lab Results:  Results for orders placed or performed during the hospital encounter of 04/19/23 (  from the past 48 hour(s))  CK     Status: Abnormal   Collection Time: 04/22/23  7:16 PM  Result Value Ref Range   Total CK 937 (H) 49 - 397 U/L    Comment: Performed at [facility], [address] [place], [place] [address]  Lipid panel     Status: Abnormal   Collection Time: 04/22/23  7:16 PM  Result Value Ref Range   Cholesterol 126 0 - 200 mg/dL   Triglycerides 914 <782 mg/dL   HDL 21 (L) >95 mg/dL   Total CHOL/HDL Ratio 6.0 RATIO   VLDL 23 0 - 40 mg/dL   LDL Cholesterol 82 0 - 99 mg/dL    Comment:        Total Cholesterol/HDL:CHD Risk Coronary Heart Disease Risk Table                     Men   Women  1/2 Average Risk   3.4   3.3  Average Risk       5.0   4.4  2 X Average Risk   9.6   7.1  3 X Average Risk  23.4   11.0        Use the calculated Patient Ratio above and the CHD Risk Table to determine the patient's CHD Risk.        ATP III CLASSIFICATION (LDL):  <100     mg/dL   Optimal  621-308  mg/dL   Near or Above                    Optimal  130-159  mg/dL   Borderline  657-846  mg/dL   High  >962     mg/dL   Very High Performed at [facility], [address] [place], [place] [address]    Hemoglobin A1c     Status: None   Collection Time: 04/22/23  7:16 PM  Result Value Ref Range   Hgb A1c MFr Bld 5.4 4.8 - 5.6 %    Comment: (NOTE)         Prediabetes: 5.7 - 6.4         Diabetes: >6.4         Glycemic control for adults with diabetes: <7.0    Mean Plasma Glucose 108 mg/dL    Comment: (NOTE) Performed At: [facility] [address] [place], [place] [id number] Jolene Schimke MD VO:[id number]   Comprehensive metabolic panel     Status: Abnormal   Collection Time: 04/22/23  7:16 PM  Result Value Ref Range   Sodium 132 (L) 135 - 145 mmol/L   Potassium 3.4 (L) 3.5 - 5.1 mmol/L   Chloride 103 98 - 111 mmol/L   CO2 19 (L) 22 - 32 mmol/L   Glucose, Bld 115 (H) 70 - 99 mg/dL    Comment: Glucose reference range applies only to samples taken after fasting for at least 8 hours.   BUN 14 6 - 20 mg/dL   Creatinine, Ser 4.25 0.61 - 1.24 mg/dL   Calcium 8.3 (L) 8.9 - 10.3 mg/dL   Total Protein 6.9 6.5 - 8.1 g/dL   Albumin 4.1 3.5 - 5.0 g/dL   AST 53 (H) 15 - 41 U/L   ALT 59 (H) 0 - 44 U/L   Alkaline Phosphatase 50 38 - 126 U/L   Total Bilirubin 1.0 0.3 - 1.2 mg/dL   GFR, Estimated >95 >63 mL/min  Comment: (NOTE) Calculated using the CKD-EPI Creatinine Equation (2021)    Anion gap 10 5 - 15    Comment: Performed at [facility], [address] [place], [place] [address]    Blood Alcohol level:  Lab Results  Component Value Date   [facility] <10 04/18/2023   ETH <10 07/08/2021    Metabolic Disorder Labs: Lab Results  Component Value Date   HGBA1C 5.4 04/22/2023   MPG 108 04/22/2023   Lab Results  Component Value Date   PROLACTIN 22.3 (H) 09/11/2022   Lab Results  Component Value Date   CHOL 126 04/22/2023   TRIG 113 04/22/2023   HDL 21 (L) 04/22/2023   CHOLHDL 6.0 04/22/2023   VLDL 23 04/22/2023   LDLCALC 82 04/22/2023    Physical Findings: AIMS: Facial and Oral Movements Muscles of Facial Expression: None, normal Lips and  Perioral Area: None, normal Jaw: None, normal Tongue: None, normal,Extremity Movements Upper (arms, wrists, hands, fingers): None, normal Lower (legs, knees, ankles, toes): None, normal, Trunk Movements Neck, shoulders, hips: None, normal, Overall Severity Severity of abnormal movements (highest score from questions above): None, normal Incapacitation due to abnormal movements: None, normal Patient's awareness of abnormal movements (rate only patient's report): No Awareness, Dental Status Current problems with teeth and/or dentures?: No Does patient usually wear dentures?: No   Musculoskeletal: Strength & Muscle Tone: within normal limits Gait & Station: normal Patient leans: N/A  Psychiatric Specialty Exam:  Presentation  General Appearance:   Casual; Appropriate for Environment   Eye Contact:  Fleeting  Speech:  Clear and Coherent; Normal Rate  Speech Volume:  Decreased  Handedness:  Right    Mood and Affect  Mood:  Euthymic  Affect:  Inappropriate; Blunt; Non-Congruent   Thought Process  Thought Processes:  Disorganized  Descriptions of Associations: Loose  Orientation: Partial  Thought Content: Scattered; Tangential  History of Schizophrenia/Schizoaffective disorder: Yes  Duration of Psychotic Symptoms: Greater than six months  Hallucinations: Hallucinations: Auditory Description of Auditory Hallucinations: Possible music? Unclear whether he believes it is in his head or not. Pt unable to confirm.  Ideas of Reference: None  Suicidal Thoughts: Suicidal Thoughts: No  Homicidal Thoughts: Homicidal Thoughts: No    Sensorium  Memory: Immediate Poor; Remote Fair; Recent Fair   Judgment:  Poor   Insight:  Poor    Executive Functions  Concentration:  Poor  Attention Span:  Fair  Recall:  Poor  Fund of Knowledge:  Good  Language:  Good  Psychomotor Activity  Psychomotor Activity:  Psychomotor Activity:  Restlessness AIMS Completed?: No   Physical Exam: Physical Exam Vitals and nursing note reviewed. Exam conducted with a chaperone present.  Constitutional:      General: He is awake. He is not in acute distress.    Appearance: He is obese.  HENT:     Head: Normocephalic and atraumatic.  Pulmonary:     Effort: Pulmonary effort is normal. No respiratory distress.  Musculoskeletal:        General: Normal range of motion.     Cervical back: Normal range of motion.  Neurological:     Mental Status: He is alert.     Motor: Motor function is intact.     Gait: Gait is intact.     Comments: Patient demonstrated his flexibility today on exam, he was willing to do all requested activities without signs of stiffness or difficulty.  Psychiatric:        Behavior: Behavior is cooperative.  Review of Systems  Respiratory:  Negative for shortness of breath.   Cardiovascular:  Negative for chest pain.  Gastrointestinal:  Negative for abdominal pain, nausea and vomiting.  Musculoskeletal:  Negative for falls.  Neurological:  Negative for tremors.  Psychiatric/Behavioral:  Positive for hallucinations. Negative for depression and suicidal ideas. The patient is not nervous/anxious and does not have insomnia.     Blood pressure (!) 120/57, pulse 85, temperature 98.7 F (37.1 C), temperature source Oral, resp. rate 16, height 6\' 5"  (1.956 m), weight (!) 171 kg, SpO2 98%. Body mass index is 44.71 kg/m.   Treatment Plan  ASSESSMENT: Matthew Padilla is a 27 year-old male with past psychiatric history of schizophrenia, paranoid type and past medical history of hypogonadism. He presented to [facility] ED for tachycardia and found to have mild rhabdomyolysis. Patient was medically stabilized and admitted to inpatient [facility] under IVC for possible exacerbation of schizophrenia.    Patient accurately states the name of his outpatient psychiatrist Dr. Kathryne Sharper. He notes his last visit was several  months ago although on chart review his last virtual follow up was on 7/3. At the time, patient was reportedly taking Zoloft 50 mg and Risperdal 1 mg as per Dr. Clarise Cruz note, but patient denies taking all medications for the last several months as they were not working for him. He has had 2 prior psychiatric hospitalizations in the past, with the most recent in 2022 for bizarre behavior and psychosis.   7/20: Patient remains disorganized with scattered thought process and thought content on exam this morning. His behavior has improved as he did not receive any PRN medications for agitation during the past 24 hours. He also slept for 8.5 hours and did not disturb other patients on the unit throughout the night. There seem to be no adverse effects from the medications, including extrapyramidal symptoms like akathisia and pseudoparkinsonism.  Continues to require one-to-one supervision as his needs for frequent redirection to avoid going into other patient's rooms are in indication that he is still not at his baseline.  He would be disruptive if he went to other parts of the hospital.  It is only through constant redirection that he is able to be around others.  No major medication changes planned for the moment, would like to see how he does on the increased dose of Risperdal that began last night.  Patient grows agitated when one of the manic patients gets too close to him.  He is unlikely to become violent, however it does reinforce the need for one-to-one supervision.  A key to his remaining well-adjusted in the outside world will be getting through to him about how psychoactive mushrooms are not regulated or tested and how much this illicit drug use puts him at risk for decompensation.  Given his recent rhabdomyolysis prior to admission, appropriate to order follow-up labs for tomorrow while making sure that he is medically stable.  Also ordering another twelve-lead EKG in order to make sure that his  multiple medications have not prolong his QTc.   Diagnoses / Active Problems: -- Schizophrenia vs Schizoaffective Disorder -- Substance-Induced Psychosis   PLAN: Safety and Monitoring:             -- In-Voluntary admission to inpatient psychiatric unit for safety, stabilization and treatment             -- Daily contact with patient to assess and evaluate symptoms and progress in treatment             --  Patient's case to be discussed in multi-disciplinary team meeting             -- Observation Level : q15 minute checks             -- Vital signs:  q12 hours             -- Precautions: suicide, elopement, and assault   2. Psychiatric Diagnoses and Treatment:  -- Continue Risperdal m-tabs, 4 mg oral dissolvable tablets BID for acute psychosis -- Continue thorazine 50 mg disintegrating tablet at 0700, 1500 (first dose scheduled for 1500 on 7/19) daily for psychosis -- Continue thorazine 100 mg tablet at 2200 daily for psychosis             -- Change Propranolol to ER 120 mg daily for tachycardia, blood pressure, anxiolysis.             -- Change PRNs diphenhydramine 50 mg / lorazepam 2 mg / thorazine 25 mg injection / 50 mg oral tablet             -- The risks/benefits/side-effects/alternatives to this medication were discussed in detail with the patient and time was given for questions. The patient consents to medication trial.              -- Metabolic profile and EKG monitoring obtained while on an atypical antipsychotic (Body mass index is 44.71 kg/m, Lipid Panel: HDL 21, LDL 82, triglycerides 113, total 126, HbgA1c: 5.4%, QTc:435 on 7/16)              -- Encouraged patient to participate in unit milieu and in scheduled group therapies              -- Short Term Goals: Ability to demonstrate self-control will improve and Ability to identify triggers associated with substance abuse/mental health issues will improve             -- Long Term Goals: Improvement in symptoms so as ready for  discharge               3. Medical Issues Being Addressed:  -- Labs reviewed, notable for recovering rhabdomyolysis, mild troponin leak, elevated AST, positive for THC.   -- Elevated blood glucose: Continue metformin XR 500 mg once in the morning  -- Follow-up CBC, CMP, Ammonia level   4. Discharge Planning:              -- Social work and case management to assist with discharge planning and identification of hospital follow-up needs prior to discharge             -- Estimated LOS: 5-7 days             -- Discharge Concerns: Need to establish a safety plan; Medication compliance and effectiveness             -- Discharge Goals: Return home with outpatient referrals for mental health follow-up including medication management/psychotherapy  Signed: Christie Nottingham, MD [facility] Physician 04/24/2023 12:28 PM

## 2023-04-24 NOTE — Progress Notes (Signed)
1:1 Note Pt observed being loud and verbally abuse toward the staff seating with him. Pt very disorganized, laying on the floor, pushing on the call buttons, changing the [facility] constantly and will not listen to the staff. Pt had to be placed in the quite room to sleep. Pt had to medicated with Ativan 2 mg and thorazine 25 mg PO for agitation. Will continue to monitor.

## 2023-04-24 NOTE — Congregational Nurse Program (Signed)
1:1 Note:  Pt observed sitting in the dayroom. No additional concerns. No behavioral issues. Pt remains safe.

## 2023-04-25 DIAGNOSIS — F203 Undifferentiated schizophrenia: Secondary | ICD-10-CM | POA: Diagnosis not present

## 2023-04-25 NOTE — Progress Notes (Signed)
   04/25/23 2300  Psych Admission Type (Psych Patients Only)  Admission Status Involuntary  Psychosocial Assessment  Patient Complaints Anxiety  Eye Contact Brief  Facial Expression Flat  Affect Preoccupied  Speech Pressured  Interaction Evasive  Motor Activity Fidgety;Restless;Pacing  Appearance/Hygiene Unremarkable  Behavior Characteristics Anxious;Impulsive;Hyperactive  Mood Anxious  Aggressive Behavior  Effect No apparent injury  Thought Process  Coherency Disorganized;Tangential  Content Preoccupation;Paranoia  Delusions Paranoid  Perception Hallucinations  Hallucination Visual  Judgment Impaired  Confusion Moderate  Danger to Self  Current suicidal ideation? Denies  Danger to Others  Danger to Others None reported or observed

## 2023-04-25 NOTE — Group Note (Signed)
Date:  04/25/2023 Time:  8:49 PM  Group Topic/Focus:  Wrap-Up Group:   The focus of this group is to help patients review their daily goal of treatment and discuss progress on daily workbooks.    Participation Level:  Did Not Attend    Scot Dock 04/25/2023, 8:49 PM

## 2023-04-25 NOTE — Progress Notes (Signed)
[facility] MD Progress Note  04/25/2023 10:41 AM Matthew Padilla  MRN:  [id number]  Subjective:   Matthew Padilla is a 27 year-old male with past psychiatric history of schizophrenia, paranoid type and past medical history of hypogonadism. He presented to [facility] ED for psychosis and tachycardia and found to have mild rhabdomyolysis. Patient was medically stabilized and admitted to inpatient [facility] under IVC for possible exacerbation of schizophrenia.    Per nursing, overnight the pt received PRN medication for agitation. He was in the quiet room for some time. Poor sleep.  On my assessment today the pt is still disorganized but has some more linearity to speech. There is some improvement despite the patient still being illogical and psychotic. Denies AH, VH. Reports that he thinks meds are working but cannot identify how/why he thinks this. Denies s/e to current meds. He remains on 1:1. Reports that sleep is okay (contradicts nursing report). Pt has poor insight, judgement. Less confused compared to yesterday.  He refused labs but cannot recall why.  Denies SI, HI.  No eps on my exam today.    Principal Problem: Schizophrenia (HCC) Diagnosis: Principal Problem:   Schizophrenia (HCC)  Total Time spent with patient: 20 minutes  Past Psychiatric History:  Diagnosed with schizophrenia in 2022 after previous hospitalization for psychosis. Pt has experienced past psychotic episodes in the summer months and depressive symptoms in the fall. Two total prior psychiatric hospitalizations in the past, with the most recent in 2022 for bizarre behavior and psychosis.   Past Medical History:  Past Medical History:  Diagnosis Date   Anxiety    Low testosterone in male    Paranoid schizophrenia (HCC)    History reviewed. No pertinent surgical history. Family History: History reviewed. No pertinent family history. Family Psychiatric  History: See H&P Social History:  Social History   Substance and Sexual  Activity  Alcohol Use Not Currently     Social History   Substance and Sexual Activity  Drug Use Yes   Types: Other-see comments    Social History   Socioeconomic History   Marital status: Single    Spouse name: Not on file   Number of children: Not on file   Years of education: Not on file   Highest education level: Not on file  Occupational History   Not on file  Tobacco Use   Smoking status: Never   Smokeless tobacco: Never  Substance and Sexual Activity   Alcohol use: Not Currently   Drug use: Yes    Types: Other-see comments   Sexual activity: Never  Other Topics Concern   Not on file  Social History Narrative   Not on file   Social Determinants of Health   Financial Resource Strain: Not on file  Food Insecurity: Patient Declined (04/19/2023)   Hunger Vital Sign    Worried About Running Out of Food in the Last Year: Patient declined    Ran Out of Food in the Last Year: Patient declined  Transportation Needs: Patient Declined (04/19/2023)   PRAPARE - Administrator, Civil Service (Medical): Patient declined    Lack of Transportation (Non-Medical): Patient declined  Physical Activity: Not on file  Stress: Not on file  Social Connections: Not on file   Additional Social History:                         Sleep: Poor  Appetite:  Fair  Current Medications: Current Facility-Administered Medications  Medication Dose Route Frequency Provider Last Rate Last Admin   acetaminophen (TYLENOL) tablet 650 mg  650 mg Oral Q6H PRN Eligha Bridegroom, NP   650 mg at 04/25/23 0806   alum & mag hydroxide-simeth (MAALOX/MYLANTA) 200-200-20 MG/5ML suspension 30 mL  30 mL Oral Q4H PRN Eligha Bridegroom, NP       chlorproMAZINE (THORAZINE) injection 25 mg  25 mg Intramuscular TID PRN Hameed Kolar, Harrold Donath, MD       chlorproMAZINE (THORAZINE) tablet 100 mg  100 mg Oral QHS Margaretmary Dys, MD   100 mg at 04/24/23 2037   chlorproMAZINE (THORAZINE)  tablet 50 mg  50 mg Oral TID PRN Phineas Inches, MD   50 mg at 04/24/23 2136   chlorproMAZINE (THORAZINE) tablet 50 mg  50 mg Oral BID Margaretmary Dys, MD   50 mg at 04/25/23 0805   cloNIDine (CATAPRES) tablet 0.1 mg  0.1 mg Oral Q4H PRN Margaretmary Dys, MD       hydrOXYzine (ATARAX) tablet 25 mg  25 mg Oral TID PRN Phineas Inches, MD   25 mg at 04/24/23 1807   LORazepam (ATIVAN) tablet 2 mg  2 mg Oral TID PRN Phineas Inches, MD   2 mg at 04/24/23 2136   Or   LORazepam (ATIVAN) injection 2 mg  2 mg Intramuscular TID PRN Paydon Carll, Harrold Donath, MD       magnesium hydroxide (MILK OF MAGNESIA) suspension 30 mL  30 mL Oral Daily PRN Eligha Bridegroom, NP       metFORMIN (GLUCOPHAGE-XR) 24 hr tablet 500 mg  500 mg Oral Q breakfast Margaretmary Dys, MD   500 mg at 04/25/23 0805   propranolol ER (INDERAL LA) 24 hr capsule 120 mg  120 mg Oral Daily Margaretmary Dys, MD   120 mg at 04/25/23 0805   risperiDONE (RISPERDAL M-TABS) disintegrating tablet 4 mg  4 mg Oral BID Margaretmary Dys, MD   4 mg at 04/25/23 0805   traZODone (DESYREL) tablet 200 mg  200 mg Oral QHS PRN Phineas Inches, MD   200 mg at 04/24/23 2037    Lab Results: No results found for this or any previous visit (from the past 48 hour(s)).  Blood Alcohol level:  Lab Results  Component Value Date   ETH <10 04/18/2023   ETH <10 07/08/2021    Metabolic Disorder Labs: Lab Results  Component Value Date   HGBA1C 5.4 04/22/2023   MPG 108 04/22/2023   Lab Results  Component Value Date   PROLACTIN 22.3 (H) 09/11/2022   Lab Results  Component Value Date   CHOL 126 04/22/2023   TRIG 113 04/22/2023   HDL 21 (L) 04/22/2023   CHOLHDL 6.0 04/22/2023   VLDL 23 04/22/2023   LDLCALC 82 04/22/2023    Physical Findings: AIMS: Facial and Oral Movements Muscles of Facial Expression: None, normal Lips and Perioral Area: None, normal Jaw: None,  normal Tongue: None, normal,Extremity Movements Upper (arms, wrists, hands, fingers): None, normal Lower (legs, knees, ankles, toes): None, normal, Trunk Movements Neck, shoulders, hips: None, normal, Overall Severity Severity of abnormal movements (highest score from questions above): None, normal Incapacitation due to abnormal movements: None, normal Patient's awareness of abnormal movements (rate only patient's report): No Awareness, Dental Status Current problems with teeth and/or dentures?: No Does patient usually wear dentures?: No  CIWA:    COWS:     Musculoskeletal: Strength & Muscle Tone: within normal limits Gait & Station:  normal Patient leans: N/A  Psychiatric Specialty Exam:  Presentation  General Appearance:  Casual; Fairly Groomed  Eye Contact: Fair  Speech: Normal Rate  Speech Volume: Normal  Handedness: Right   Mood and Affect  Mood: Anxious  Affect: Constricted   Thought Process  Thought Processes: -- (more linear, less disorganized)  Descriptions of Associations:-- (more linear)  Orientation:Partial  Thought Content:Illogical  History of Schizophrenia/Schizoaffective disorder:Yes  Duration of Psychotic Symptoms:Greater than six months  Hallucinations:Hallucinations: None Description of Auditory Hallucinations: Possible music? Unclear whether he believes it is in his head or not. Pt unable to confirm.  Ideas of Reference:None  Suicidal Thoughts:Suicidal Thoughts: No  Homicidal Thoughts:Homicidal Thoughts: No   Sensorium  Memory: Immediate Fair; Remote Fair; Recent Poor  Judgment: Poor  Insight: Poor   Executive Functions  Concentration: Poor  Attention Span: Poor  Recall: Poor  Fund of Knowledge: Good  Language: Good   Psychomotor Activity  Psychomotor Activity: Psychomotor Activity: Normal AIMS Completed?: No   Assets  Assets: Financial Resources/Insurance; Housing; Social Support   Sleep   Sleep: Sleep: Poor    Physical Exam: Physical Exam Vitals reviewed.  Constitutional:      General: He is not in acute distress.    Appearance: He is normal weight. He is not toxic-appearing.  Pulmonary:     Effort: Pulmonary effort is normal. No respiratory distress.  Neurological:     Mental Status: He is alert.     Motor: No weakness.     Gait: Gait normal.    Review of Systems  Constitutional:  Negative for chills and fever.  Cardiovascular:  Negative for chest pain and palpitations.  Neurological:  Negative for dizziness, tingling, tremors and headaches.  Psychiatric/Behavioral:  Positive for memory loss and substance abuse. Negative for depression, hallucinations and suicidal ideas. The patient is nervous/anxious and has insomnia.   All other systems reviewed and are negative.  Blood pressure (!) 120/57, pulse 85, temperature 98.7 F (37.1 C), temperature source Oral, resp. rate 16, height 6\' 5"  (1.956 m), weight (!) 171 kg, SpO2 98%. Body mass index is 44.71 kg/m.   Treatment Plan Summary: Daily contact with patient to assess and evaluate symptoms and progress in treatment and Medication management  Assessment:  Diagnoses / Active Problems: -- Schizophrenia vs Schizoaffective Disorder -- R/o Substance-Induced Psychosis   PLAN: Safety and Monitoring:             -- Involuntary admission to inpatient psychiatric unit for safety, stabilization and treatment             -- Daily contact with patient to assess and evaluate symptoms and progress in treatment             -- Patient's case to be discussed in multi-disciplinary team meeting             -- Observation Level : q15 minute checks             -- Vital signs:  q12 hours             -- Precautions: suicide, elopement, and assault   2. Psychiatric Diagnoses and Treatment:  -- Continue Risperdal m-tabs, 4 mg oral dissolvable tablets BID for psychosis -- Continue thorazine 50 mg disintegrating tablet at 0700,  1500 (first dose scheduled for 1500 on 7/19) daily for psychosis -- Continue thorazine 100 mg tablet at 2200 daily for psychosis             -- Continue Propranolol ER  120 mg daily for tachycardia, blood pressure, anxiolysis.  -considered starting restoril for sleep, but do not want to over medicate or put at higher risk of falls, will monitor for 1 more night, and if insomnia continues, will consider other alternative then               -- The risks/benefits/side-effects/alternatives to this medication were discussed in detail with the patient and time was given for questions. The patient consents to medication trial.              -- Metabolic profile and EKG monitoring obtained while on an atypical antipsychotic (Body mass index is 44.71 kg/m, Lipid Panel: HDL 21, LDL 82, triglycerides 113, total 126, HbgA1c: 5.4%, QTc:435 on 7/16)              -- Encouraged patient to participate in unit milieu and in scheduled group therapies              -- Short Term Goals: Ability to demonstrate self-control will improve and Ability to identify triggers associated with substance abuse/mental health issues will improve             -- Long Term Goals: Improvement in symptoms so as ready for discharge                3. Medical Issues Being Addressed:  -- Labs reviewed, notable for recovering rhabdomyolysis, mild troponin leak, elevated AST, positive for THC.              -- Elevated blood glucose: Continue metformin XR 500 mg once in the morning             -- Follow-up CBC, CMP, Ammonia level   4. Discharge Planning:              -- Social work and case management to assist with discharge planning and identification of hospital follow-up needs prior to discharge             -- Estimated LOS: 5-7 days             -- Discharge Concerns: Need to establish a safety plan; Medication compliance and effectiveness             -- Discharge Goals: Return home with outpatient referrals for mental health follow-up  including medication management/psychotherapy   Cristy Hilts, MD 04/25/2023, 10:41 AM  Total Time Spent in Direct Patient Care:  I personally spent 35 minutes on the unit in direct patient care. The direct patient care time included face-to-face time with the patient, reviewing the patient's chart, communicating with other professionals, and coordinating care. Greater than 50% of this time was spent in counseling or coordinating care with the patient regarding goals of hospitalization, psycho-education, and discharge planning needs.   Phineas Inches, MD Psychiatrist

## 2023-04-25 NOTE — Plan of Care (Signed)
  Problem: Education: Goal: Knowledge of [facility] Education information/materials will improve Outcome: Not Progressing Goal: Emotional status will improve Outcome: Not Progressing   Problem: Activity: Goal: Interest or engagement in activities will improve Outcome: Not Progressing

## 2023-04-25 NOTE — Plan of Care (Signed)
Problem: Education: Goal: Knowledge of [facility] Education information/materials will improve Outcome: Progressing   Problem: Physical Regulation: Goal: Ability to maintain clinical measurements within normal limits will improve Outcome: Progressing   Problem: Safety: Goal: Periods of time without injury will increase Outcome: Progressing  Pt awake, observed wandering in dayroom and hall. Presents animated with bright affect, fair eye contact and soft, tangential speech. Pt remains disorganized and confused on interactions but is cooperative with care thus far. Continues to need frequent verbal redirections due to being intrusive and impulse. Tolerated breakfast and fluids. 1:1 observation maintained with assigned staff in attendance at all times.

## 2023-04-25 NOTE — Progress Notes (Signed)
Pt awake, tolerated meals and medications. PRN Thorazine 50 mg and Vistaril 25 mg given at 1558 for increased anxiety, restlessness and agitation with minimal effect when reassessed at 1650. He tolerated dinner and fluids well. Continues to need redirections for safety in milieu. 1:1 observation maintained with assigned staff in attendance at all times.

## 2023-04-25 NOTE — Progress Notes (Signed)
Pt tolerated lunch and medications well. Continues to wanders in hall, confused. Took a nap post lunch. 1:1 observation maintained with assigned staff in attendance at all times.

## 2023-04-25 NOTE — Progress Notes (Signed)
1:1 Note Pt remains in the quite room, pt still unable to go to sleep, kept on getting up, talking loud and argumentative with staff. Pt offered fluid, staff remains with the pt, 1:1 obs maintained for safety, will continue to monitor

## 2023-04-25 NOTE — Progress Notes (Signed)
1:1: Note Pt's  a sleep at this at this time, remains in the quite room. Continues to be on 1:1 obs for safety, will continue to monitor.

## 2023-04-26 ENCOUNTER — Encounter (HOSPITAL_COMMUNITY): Payer: Self-pay

## 2023-04-26 DIAGNOSIS — F203 Undifferentiated schizophrenia: Secondary | ICD-10-CM | POA: Diagnosis not present

## 2023-04-26 MED ORDER — CHLORPROMAZINE HCL 25 MG PO TABS
37.5000 mg | ORAL_TABLET | Freq: Two times a day (BID) | ORAL | Status: DC
  Administered 2023-04-26 – 2023-04-29 (×6): 37.5 mg via ORAL
  Filled 2023-04-26 (×8): qty 2

## 2023-04-26 NOTE — Group Note (Signed)
Date:  04/26/2023 Time:  8:39 PM  Group Topic/Focus:  Wrap-Up Group:   The focus of this group is to help patients review their daily goal of treatment and discuss progress on daily workbooks.    Participation Level:  Active  Participation Quality:  Appropriate  Affect:  Appropriate  Cognitive:  Appropriate  Insight: Appropriate  Engagement in Group:  Engaged  Modes of Intervention:  Education and Exploration  Additional Comments:  Patient attended and participated in group tonight. He reports that his day started out rough. He promise himself that he would not hurt himself. He kept his promise to himself. He eat well today.  Lita Mains [facility] 04/26/2023, 8:39 PM

## 2023-04-26 NOTE — Group Note (Signed)
Recreation Therapy Group Note   Group Topic:Coping Skills  Group Date: 04/26/2023 Start Time: 1030 End Time: 1055 Facilitators: Chaselynn Kepple-McCall, LRT,CTRS Location: [address] Dayroom   Goal Area(s) Addresses:  Patient will identify positive coping techniques. Patient will identify benefits of using coping skills post d/c.  Group Description:  Mind Map.  Patient was provided a blank template of a diagram with 32 blank boxes in a tiered system, branching from the center (similar to a bubble chart). LRT directed patients to label the middle of the diagram "Coping Skills" and consider 8 different sources in which coping skills can be used.  Patient was directed to record their sources (depression, cops, no cop outs, family problems, work, court, stress and grief) in the 2nd tier boxes closest to the center. Patients were to then come up with 3 effective coping skills to address each identified area in the remaining boxes stemming from a particular source. Pts were encouraged to share ideas with one another and ask for suggestions of peers and Clinical research associate when stuck on a certain category of stress.   Affect/Mood: Appropriate   Participation Level: Moderate   Participation Quality: Independent   Behavior: Attentive    Speech/Thought Process: Irrational   Insight: Limited   Judgement: Limited   Modes of Intervention: Worksheet   Patient Response to Interventions:  Receptive   Education Outcome:  In group clarification offered    Clinical Observations/Individualized Feedback: Pt was still exhibiting some off beat behavior. Pt comments were also off at times. Pt identified and area were coping skills would be needed was "no cop outs". Pt identified coping skills for that was "sheriff's appointment and talking to cops". Pt was attentive for remainder of group.      Plan: Continue to engage patient in RT group sessions 2-3x/week.   Graceann Boileau-McCall, LRT,CTRS 04/26/2023 12:47  PM

## 2023-04-26 NOTE — Plan of Care (Signed)
  Problem: Education: Goal: Knowledge of [facility] Education information/materials will improve Outcome: Progressing Goal: Emotional status will improve Outcome: Progressing Goal: Mental status will improve Outcome: Progressing   Problem: Activity: Goal: Interest or engagement in activities will improve Outcome: Progressing Goal: Sleeping patterns will improve Outcome: Progressing   

## 2023-04-26 NOTE — Progress Notes (Signed)
[name] MD Progress Note  04/26/2023 10:42 AM Matthew Padilla  MRN:  [id number] Principal Problem: Schizophrenia (HCC) Diagnosis: Principal Problem:   Schizophrenia (HCC)   Subjective:   Matthew Padilla is a 27 year-old male with past psychiatric history of schizophrenia, paranoid type and past medical history of hypogonadism. He presented to [facility] ED for psychosis and tachycardia and found to have mild rhabdomyolysis. Patient was medically stabilized and admitted to inpatient [facility] under IVC for possible exacerbation of schizophrenia.   Case was discussed in the multidisciplinary team. MAR was reviewed and patient was compliant with medications.   PRNs in the last 24 hours: 07/21 1115  tylenol 650 mg 07/21 1554 hydrOXYzine tablet 25 mg 07/21 1950 traZODone tablet 200 mg 07/21 1115 maalox  Psychiatric Team made the following recs yesterday: -- Increase Risperdal m-tabs, 4 mg oral dissolvable tablets BID for acute psychosis -- Increase thorazine 50 mg disintegrating tablet at 0700, 1500 (first dose scheduled for 1500 on 7/19) daily for psychosis -- Continue thorazine 100 mg tablet at 2200 daily for psychosis             -- Change Propranolol to ER 120 mg daily for tachycardia, blood pressure, anxiolysis.             -- Change PRNs diphenhydramine 50 mg / lorazepam 2 mg / thorazine 25 mg injection / 50 mg oral tablet  Today on morning interview, patient reports feeling "pretty good." Sleep is good. Denies medication side effects.   Patient also remains restless, but he is not akathisic. He was seated in an office chair and spinning back and forth, but was able to answer questions. Talked a little bit about his mushroom consumption, but his answers continue to be contradictory one moment to the next. His total usage on the day prior to admission could be anywhere from 2-8 grams of mushrooms based on his responses plus additional capsules of unknown strength. Pt does not recall or does not  want to share why he got maalox yesterday.  Denies depression and anxiety. Denies physical concerns. Denies paranoia. Denies auditory hallucinations, suicidal thoughts, and homicidal ideation.   He denies any muscle stiffness or involuntary muscle movements.  Sleep: Good Appetite:  Good Depression: Denies Anxiety: Denies Auditory Hallucinations: Denies Visual Hallucinations: Denies Paranoia: Denies [id number] SI: Denies Side effects from medications: does not endorse any side-effects they attribute to medications.  Total time spent with patient: 20 minutes  Past psychiatric history:  Diagnosed with schizophrenia in 2022 after previous hospitalization for psychosis. Pt has experienced past psychotic episodes in the summer months and depressive symptoms in the fall. Two total prior psychiatric hospitalizations in the past, with the most recent in 2022 for bizarre behavior and psychosis.   Past Medical History:  Past Medical History:  Diagnosis Date   Anxiety    Low testosterone in male    Paranoid schizophrenia (HCC)     History reviewed. No pertinent surgical history. Family History: History reviewed. No pertinent family history. Family Psychiatric History:  fmhx of bipolar disorder in patient's aunt well-controlled on lithium.   Social History:  Social History   Substance and Sexual Activity  Alcohol Use Not Currently     Social History   Substance and Sexual Activity  Drug Use Yes   Types: Other-see comments    Social History   Socioeconomic History   Marital status: Single    Spouse name: Not on file   Number of children: Not on file  Years of education: Not on file   Highest education level: Not on file  Occupational History   Not on file  Tobacco Use   Smoking status: Never   Smokeless tobacco: Never  Substance and Sexual Activity   Alcohol use: Not Currently   Drug use: Yes    Types: Other-see comments   Sexual activity: Never  Other Topics Concern    Not on file  Social History Narrative   Not on file   Social Determinants of Health   Financial Resource Strain: Not on file  Food Insecurity: Patient Declined (04/19/2023)   Hunger Vital Sign    Worried About Running Out of Food in the Last Year: Patient declined    Ran Out of Food in the Last Year: Patient declined  Transportation Needs: Patient Declined (04/19/2023)   PRAPARE - Administrator, Civil Service (Medical): Patient declined    Lack of Transportation (Non-Medical): Patient declined  Physical Activity: Not on file  Stress: Not on file  Social Connections: Not on file    Current Medications: Current Facility-Administered Medications  Medication Dose Route Frequency Provider Last Rate Last Admin   acetaminophen (TYLENOL) tablet 650 mg  650 mg Oral Q6H PRN Eligha Bridegroom, NP   650 mg at 04/26/23 0816   alum & mag hydroxide-simeth (MAALOX/MYLANTA) 200-200-20 MG/5ML suspension 30 mL  30 mL Oral Q4H PRN Eligha Bridegroom, NP   30 mL at 04/25/23 1115   chlorproMAZINE (THORAZINE) injection 25 mg  25 mg Intramuscular TID PRN Massengill, Harrold Donath, MD       chlorproMAZINE (THORAZINE) tablet 100 mg  100 mg Oral QHS Margaretmary Dys, MD   100 mg at 04/25/23 2242   chlorproMAZINE (THORAZINE) tablet 37.5 mg  37.5 mg Oral BID Margaretmary Dys, MD       chlorproMAZINE (THORAZINE) tablet 50 mg  50 mg Oral TID PRN Phineas Inches, MD   50 mg at 04/25/23 1953   cloNIDine (CATAPRES) tablet 0.1 mg  0.1 mg Oral Q4H PRN Margaretmary Dys, MD       hydrOXYzine (ATARAX) tablet 25 mg  25 mg Oral TID PRN Phineas Inches, MD   25 mg at 04/25/23 1554   LORazepam (ATIVAN) tablet 2 mg  2 mg Oral TID PRN Phineas Inches, MD   2 mg at 04/25/23 1950   Or   LORazepam (ATIVAN) injection 2 mg  2 mg Intramuscular TID PRN Massengill, Harrold Donath, MD       magnesium hydroxide (MILK OF MAGNESIA) suspension 30 mL  30 mL Oral Daily PRN Eligha Bridegroom, NP        metFORMIN (GLUCOPHAGE-XR) 24 hr tablet 500 mg  500 mg Oral Q breakfast Margaretmary Dys, MD   500 mg at 04/26/23 0815   propranolol ER (INDERAL LA) 24 hr capsule 120 mg  120 mg Oral Daily Margaretmary Dys, MD   120 mg at 04/26/23 0818   risperiDONE (RISPERDAL M-TABS) disintegrating tablet 4 mg  4 mg Oral BID Margaretmary Dys, MD   4 mg at 04/26/23 0816   traZODone (DESYREL) tablet 200 mg  200 mg Oral QHS PRN Phineas Inches, MD   200 mg at 04/25/23 2243    Lab Results:  No results found for this or any previous visit (from the past 48 hour(s)).   Blood Alcohol level:  Lab Results  Component Value Date   ETH <10 04/18/2023   ETH <10 07/08/2021  Metabolic Disorder Labs: Lab Results  Component Value Date   HGBA1C 5.4 04/22/2023   MPG 108 04/22/2023   Lab Results  Component Value Date   PROLACTIN 22.3 (H) 09/11/2022   Lab Results  Component Value Date   CHOL 126 04/22/2023   TRIG 113 04/22/2023   HDL 21 (L) 04/22/2023   CHOLHDL 6.0 04/22/2023   VLDL 23 04/22/2023   LDLCALC 82 04/22/2023    Physical Findings: AIMS: Facial and Oral Movements Muscles of Facial Expression: None, normal Lips and Perioral Area: None, normal Jaw: None, normal Tongue: None, normal,Extremity Movements Upper (arms, wrists, hands, fingers): None, normal Lower (legs, knees, ankles, toes): None, normal, Trunk Movements Neck, shoulders, hips: None, normal, Overall Severity Severity of abnormal movements (highest score from questions above): None, normal Incapacitation due to abnormal movements: None, normal Patient's awareness of abnormal movements (rate only patient's report): No Awareness, Dental Status Current problems with teeth and/or dentures?: No Does patient usually wear dentures?: No   Musculoskeletal: Strength & Muscle Tone: within normal limits Gait & Station: normal Patient leans: N/A  Psychiatric Specialty  Exam:  Presentation  General Appearance:   Casual   Eye Contact:  Good  Speech:  Normal Rate  Speech Volume:  Normal  Handedness:  Right    Mood and Affect  Mood:  Depressed  Affect:  Blunt; Non-Congruent   Thought Process  Thought Processes:  Disorganized  Descriptions of Associations: Tangential  Orientation: Partial  Thought Content: Scattered  History of Schizophrenia/Schizoaffective disorder: Yes  Duration of Psychotic Symptoms: Greater than six months  Hallucinations: Hallucinations: None  Ideas of Reference: None  Suicidal Thoughts: Suicidal Thoughts: No  Homicidal Thoughts: Homicidal Thoughts: No    Sensorium  Memory: Immediate Fair; Remote Fair; Recent Poor   Judgment:  Poor   Insight:  Lacking    Executive Functions  Concentration:  Fair  Attention Span:  Poor  Recall:  Fair  Fund of Knowledge:  Good  Language:  Good  Psychomotor Activity  Psychomotor Activity:  Psychomotor Activity: Normal AIMS Completed?: No   Physical Exam: Physical Exam Vitals and nursing note reviewed. Exam conducted with a chaperone present.  Constitutional:      General: He is awake. He is not in acute distress.    Appearance: He is obese.  HENT:     Head: Normocephalic and atraumatic.  Pulmonary:     Effort: Pulmonary effort is normal. No respiratory distress.  Musculoskeletal:        General: Normal range of motion.     Cervical back: Normal range of motion.  Neurological:     Mental Status: He is alert.     Motor: Motor function is intact.     Gait: Gait is intact.  Psychiatric:        Behavior: Behavior is cooperative.     Review of Systems  Respiratory:  Negative for shortness of breath.   Cardiovascular:  Negative for chest pain.  Gastrointestinal:  Negative for abdominal pain, nausea and vomiting.  Musculoskeletal:  Negative for falls.       Pt has ingrown toenail on R big toe.  Neurological:  Negative for  tremors.  Psychiatric/Behavioral:  Negative for depression and suicidal ideas. The patient is not nervous/anxious and does not have insomnia.     Blood pressure 116/76, pulse 98, temperature 98.7 F (37.1 C), temperature source Oral, resp. rate 16, height 6\' 5"  (1.956 m), weight (!) 171 kg, SpO2 98%. Body mass index is 44.71 kg/m.  Treatment Plan  ASSESSMENT: Matthew Padilla is a 27 year-old male with past psychiatric history of schizophrenia, paranoid type and past medical history of hypogonadism. He presented to [facility] ED for tachycardia and found to have mild rhabdomyolysis. Patient was medically stabilized and admitted to inpatient [facility] under IVC for possible exacerbation of schizophrenia.    Patient accurately states the name of his outpatient psychiatrist Dr. Kathryne Sharper. He notes his last visit was several months ago although on chart review his last virtual follow up was on 7/3. At the time, patient was reportedly taking Zoloft 50 mg and Risperdal 1 mg as per Dr. Clarise Cruz note, but patient denies taking all medications for the last several months as they were not working for him. He has had 2 prior psychiatric hospitalizations in the past, with the most recent in 2022 for bizarre behavior and psychosis.   7/22: Patient remains disorganized with scattered thought process and thought content on exam this morning. He got one PRN overnight for Thorazine 50 mg tablet, but slept 10.5 hours, which is an improvement. He continues to have tangential thoughts in conversation and difficulty relating to others on the unit. Was unable to name anyone else on the unit that he has spoken to, instead offered the name of his mother [name]) as a person he's known here at the hospital.   Burgess Estelle he was sleepy in the afternoon, so will drop his daytime doses of thorazine to 37.5 mg.  A key to his remaining well-adjusted in the outside world will be getting through to him about how psychoactive mushrooms are  not regulated or tested and how much this illicit drug use puts him at risk for decompensation.   Diagnoses / Active Problems: -- Schizophrenia vs Schizoaffective Disorder -- Substance-Induced Psychosis   PLAN: Safety and Monitoring:             -- In-Voluntary admission to inpatient psychiatric unit for safety, stabilization and treatment             -- Daily contact with patient to assess and evaluate symptoms and progress in treatment             -- Patient's case to be discussed in multi-disciplinary team meeting             -- Observation Level : 1:1 continued             -- Vital signs:  q12 hours             -- Precautions: suicide, elopement, and assault   2. Psychiatric Diagnoses and Treatment:  -- Continue Risperdal m-tabs, 4 mg oral dissolvable tablets BID for acute psychosis -- Decrease thorazine to 37.5 mg disintegrating tablet at 0700, 1500 (first dose scheduled for 1500 on 7/22) daily for psychosis -- Continue thorazine 100 mg tablet at 2200 daily for psychosis             -- Change Propranolol to ER 120 mg daily for tachycardia, blood pressure, anxiolysis.             -- Change PRNs diphenhydramine 50 mg / lorazepam 2 mg / thorazine 25 mg injection / 50 mg oral tablet              -- The risks/benefits/side-effects/alternatives to this medication were discussed in detail with the patient and time was given for questions. The patient consents to medication trial.              -- Metabolic  profile and EKG monitoring obtained while on an atypical antipsychotic (Body mass index is 44.71 kg/m, Lipid Panel: HDL 21, LDL 82, triglycerides 113, total 126, HbgA1c: 5.5, QTc:435 on 7/16)              -- Encouraged patient to participate in unit milieu and in scheduled group therapies              -- Short Term Goals: Ability to demonstrate self-control will improve and Ability to identify triggers associated with substance abuse/mental health issues will improve             -- Long  Term Goals: Improvement in symptoms so as ready for discharge               3. Medical Issues Being Addressed:  -- Labs reviewed, notable for recovering rhabdomyolysis, mild troponin leak, elevated AST, positive for THC.   -- Elevated blood glucose: Start metformin XR 500 mg once in the morning   4. Discharge Planning:              -- Social work and case management to assist with discharge planning and identification of hospital follow-up needs prior to discharge             -- Estimated LOS: 5-7 days             -- Discharge Concerns: Need to establish a safety plan; Medication compliance and effectiveness             -- Discharge Goals: Return home with outpatient referrals for mental health follow-up including medication management/psychotherapy  Signed: Christie Nottingham, MD [facility] Physician 04/26/2023 10:42 AM

## 2023-04-26 NOTE — Progress Notes (Signed)
   04/26/23 2030  Psych Admission Type (Psych Patients Only)  Admission Status Involuntary  Psychosocial Assessment  Patient Complaints Anxiety  Eye Contact Brief  Facial Expression Flat  Affect Preoccupied  Speech Tangential  Interaction Attention-seeking  Motor Activity Fidgety;Restless;Pacing  Appearance/Hygiene Unremarkable  Behavior Characteristics Anxious;Hyperactive  Mood Anxious  Aggressive Behavior  Effect No apparent injury  Thought Process  Coherency Disorganized;Tangential  Content Paranoia  Delusions Paranoid  Perception Hallucinations  Hallucination Visual  Judgment Limited  Confusion Mild  Danger to Self  Current suicidal ideation? Denies  Danger to Others  Danger to Others None reported or observed

## 2023-04-26 NOTE — Progress Notes (Signed)
1:1 Note: Patient visible in the milieu. Interacts with others and participating in groups. Patient pacing but calm. Medications administered without incident. 1:1 monitoring remains. Patient stays safe at this time.

## 2023-04-26 NOTE — Progress Notes (Signed)
1:1 Note: Patient in room with no distress noted. Patient remains under 1:1 supervision. Patient remains safe at this time.

## 2023-04-26 NOTE — Group Note (Signed)
[facility] LCSW Group Therapy Note   Group Date: 04/26/2023 Start Time: 1300 End Time: 1345  Type of Therapy/Topic:  Group Therapy:  Feelings about Diagnosis  Participation Level:  Active   Mood: Pleasant/ appropriate   Description of Group:    This group will allow patients to explore their thoughts and feelings about diagnoses they have received. Patients will be guided to explore their level of understanding and acceptance of these diagnoses. Facilitator will encourage patients to process their thoughts and feelings about the reactions of others to their diagnosis, and will guide patients in identifying ways to discuss their diagnosis with significant others in their lives. This group will be process-oriented, with patients participating in exploration of their own experiences as well as giving and receiving support and challenge from other group members.   Therapeutic Goals: 1. Patient will demonstrate understanding of diagnosis as evidence by identifying two or more symptoms of the disorder:  2. Patient will be able to express two feelings regarding the diagnosis 3. Patient will demonstrate ability to communicate their needs through discussion and/or role plays  Summary of Patient Progress:    Pt was engaged and participated through out the entire session          Therapeutic Modalities:   Cognitive Behavioral Therapy Brief Therapy Feelings Identification    Cosandra Plouffe S  [place], LCSW

## 2023-04-26 NOTE — Progress Notes (Signed)
1:1 Patient in room with 1:1 sitter. No distress noted. Calm affect noted. Patient remains safe at this time.

## 2023-04-26 NOTE — Plan of Care (Signed)
  Problem: Activity: Goal: Sleeping patterns will improve Outcome: Progressing   Problem: Education: Goal: Mental status will improve Outcome: Not Progressing Goal: Verbalization of understanding the information provided will improve Outcome: Not Progressing

## 2023-04-26 NOTE — BH IP Treatment Plan (Signed)
Interdisciplinary Treatment and Diagnostic Plan Update  04/26/2023 Time of Session: 9:35 AM ( UPDATE)  Charlton Boule MRN: [id number]  Principal Diagnosis: Schizophrenia (HCC)  Secondary Diagnoses: Principal Problem:   Schizophrenia (HCC)   Current Medications:  Current Facility-Administered Medications  Medication Dose Route Frequency Provider Last Rate Last Admin   acetaminophen (TYLENOL) tablet 650 mg  650 mg Oral Q6H PRN Eligha Bridegroom, NP   650 mg at 04/26/23 0816   alum & mag hydroxide-simeth (MAALOX/MYLANTA) 200-200-20 MG/5ML suspension 30 mL  30 mL Oral Q4H PRN Eligha Bridegroom, NP   30 mL at 04/25/23 1115   chlorproMAZINE (THORAZINE) injection 25 mg  25 mg Intramuscular TID PRN Massengill, Harrold Donath, MD       chlorproMAZINE (THORAZINE) tablet 100 mg  100 mg Oral QHS Margaretmary Dys, MD   100 mg at 04/25/23 2242   chlorproMAZINE (THORAZINE) tablet 37.5 mg  37.5 mg Oral BID Margaretmary Dys, MD   37.5 mg at 04/26/23 1420   chlorproMAZINE (THORAZINE) tablet 50 mg  50 mg Oral TID PRN Phineas Inches, MD   50 mg at 04/25/23 1953   cloNIDine (CATAPRES) tablet 0.1 mg  0.1 mg Oral Q4H PRN Margaretmary Dys, MD       hydrOXYzine (ATARAX) tablet 25 mg  25 mg Oral TID PRN Phineas Inches, MD   25 mg at 04/25/23 1554   LORazepam (ATIVAN) tablet 2 mg  2 mg Oral TID PRN Phineas Inches, MD   2 mg at 04/25/23 1950   Or   LORazepam (ATIVAN) injection 2 mg  2 mg Intramuscular TID PRN Massengill, Harrold Donath, MD       magnesium hydroxide (MILK OF MAGNESIA) suspension 30 mL  30 mL Oral Daily PRN Eligha Bridegroom, NP       metFORMIN (GLUCOPHAGE-XR) 24 hr tablet 500 mg  500 mg Oral Q breakfast Margaretmary Dys, MD   500 mg at 04/26/23 0815   propranolol ER (INDERAL LA) 24 hr capsule 120 mg  120 mg Oral Daily Margaretmary Dys, MD   120 mg at 04/26/23 0818   risperiDONE (RISPERDAL M-TABS) disintegrating tablet 4 mg  4 mg  Oral BID Margaretmary Dys, MD   4 mg at 04/26/23 4098   traZODone (DESYREL) tablet 200 mg  200 mg Oral QHS PRN Phineas Inches, MD   200 mg at 04/25/23 2243   PTA Medications: Medications Prior to Admission  Medication Sig Dispense Refill Last Dose   acetaminophen (TYLENOL) 500 MG tablet Take 500 mg by mouth every 6 (six) hours as needed for moderate pain.      clomiPHENE (CLOMID) 50 MG tablet Take 1 tablet (50 mg total) by mouth daily. 30 tablet 6    ibuprofen (ADVIL) 200 MG tablet Take 200-400 mg by mouth every 6 (six) hours as needed for moderate pain.      risperiDONE (RISPERDAL) 1 MG tablet TAKE 1 TABLET(1 MG) BY MOUTH AT BEDTIME 30 tablet 2    sertraline (ZOLOFT) 50 MG tablet Take 0.5-1 tablets (25-50 mg total) by mouth daily. (Patient not taking: Reported on 04/18/2023) 30 tablet 2     Patient Stressors: Health problems   Medication change or noncompliance   Substance abuse    Patient Strengths: Supportive family/friends   Treatment Modalities: Medication Management, Group therapy, Case management,  1 to 1 session with clinician, Psychoeducation, Recreational therapy.   Physician Treatment Plan for Primary Diagnosis: Schizophrenia [facility]) Long Term Goal(s): Improvement in  symptoms so as ready for discharge   Short Term Goals: Ability to demonstrate self-control will improve Ability to identify triggers associated with substance abuse/mental health issues will improve  Medication Management: Evaluate patient's response, side effects, and tolerance of medication regimen.  Therapeutic Interventions: 1 to 1 sessions, Unit Group sessions and Medication administration.  Evaluation of Outcomes: Progressing  Physician Treatment Plan for Secondary Diagnosis: Principal Problem:   Schizophrenia (HCC)  Long Term Goal(s): Improvement in symptoms so as ready for discharge   Short Term Goals: Ability to demonstrate self-control will improve Ability to identify triggers  associated with substance abuse/mental health issues will improve     Medication Management: Evaluate patient's response, side effects, and tolerance of medication regimen.  Therapeutic Interventions: 1 to 1 sessions, Unit Group sessions and Medication administration.  Evaluation of Outcomes: Progressing   RN Treatment Plan for Primary Diagnosis: Schizophrenia (HCC) Long Term Goal(s): Knowledge of disease and therapeutic regimen to maintain health will improve  Short Term Goals: Ability to remain free from injury will improve, Ability to verbalize frustration and anger appropriately will improve, Ability to participate in decision making will improve, Ability to verbalize feelings will improve, Ability to identify and develop effective coping behaviors will improve, and Compliance with prescribed medications will improve  Medication Management: RN will administer medications as ordered by provider, will assess and evaluate patient's response and provide education to patient for prescribed medication. RN will report any adverse and/or side effects to prescribing provider.  Therapeutic Interventions: 1 on 1 counseling sessions, Psychoeducation, Medication administration, Evaluate responses to treatment, Monitor vital signs and CBGs as ordered, Perform/monitor CIWA, COWS, AIMS and Fall Risk screenings as ordered, Perform wound care treatments as ordered.  Evaluation of Outcomes: Progressing   LCSW Treatment Plan for Primary Diagnosis: Schizophrenia (HCC) Long Term Goal(s): Safe transition to appropriate next level of care at discharge, Engage patient in therapeutic group addressing interpersonal concerns.  Short Term Goals: Engage patient in aftercare planning with referrals and resources, Increase social support, Increase emotional regulation, Facilitate acceptance of mental health diagnosis and concerns, Identify triggers associated with mental health/substance abuse issues, and Increase  skills for wellness and recovery  Therapeutic Interventions: Assess for all discharge needs, 1 to 1 time with Social worker, Explore available resources and support systems, Assess for adequacy in community support network, Educate family and significant other(s) on suicide prevention, Complete Psychosocial Assessment, Interpersonal group therapy.  Evaluation of Outcomes: Progressing   Progress in Treatment: Attending groups: Yes. Participating in groups: Yes. Taking medication as prescribed: Yes. Toleration medication: Yes. Family/Significant other contact made: Yes:  Kerrion Kemppainen, mother, [phone] Patient understands diagnosis: No. Discussing patient identified problems/goals with staff: Yes. Medical problems stabilized or resolved: Yes. Denies suicidal/homicidal ideation: Yes. Issues/concerns per patient self-inventory: No.                              New problem(s) identified: No, Describe:  None reported   New Short Term/Long Term Goal(s): medication stabilization, elimination of SI thoughts, development of comprehensive mental wellness plan.     Patient Goals:  " I want to work on going home as soon as possible"   Discharge Plan or Barriers: Patient will return back with mom once he is DC    Reason for Continuation of Hospitalization: Other; describe Schizophrenia and taking schrooms    Estimated Length of Stay: 5 to 7 days   Last 3 [place] Suicide Severity Risk Score:  Flowsheet Row [facility] (Current[facility] from [facility]18/2023 in [facility] Counselor from 02/15/2023 in [facility] RISK CATEGORY No Risk No Risk Error: Question 1 not populated       Last [facility] 2/9 Scores:    01/05/2023   12:35 PM 08/13/2022    7:46 AM 09/08/2021    1:27 PM  Depression screen PHQ 2/9  Decreased Interest 0 0 1  Down, Depressed, Hopeless  0 0 0  PHQ - 2 Score 0 0 1  Altered sleeping  0   Tired, decreased energy  0   Change in appetite  0   Feeling bad or failure about yourself   1   Trouble concentrating  0   Moving slowly or fidgety/restless  0   Suicidal thoughts  0   PHQ-9 Score  1   Difficult doing work/chores  Not difficult at all     Scribe for Treatment Team: Isabella Bowens, Theresia Majors 04/26/2023 3:30 PM

## 2023-04-26 NOTE — Plan of Care (Signed)
  Problem: Education: Goal: Knowledge of [facility] Education information/materials will improve Outcome: Progressing Goal: Emotional status will improve Outcome: Progressing Goal: Mental status will improve Outcome: Progressing Goal: Verbalization of understanding the information provided will improve Outcome: Progressing   Problem: Activity: Goal: Interest or engagement in activities will improve Outcome: Progressing   

## 2023-04-26 NOTE — Progress Notes (Signed)
Nursing 1:1 note D:Pt observed sleeping in bed with eyes closed. RR even and unlabored. No distress noted. A: 1:1 observation continues for safety  R: pt remains safe  

## 2023-04-27 DIAGNOSIS — F203 Undifferentiated schizophrenia: Secondary | ICD-10-CM | POA: Diagnosis not present

## 2023-04-27 MED ORDER — LORATADINE 10 MG PO TABS
10.0000 mg | ORAL_TABLET | Freq: Every day | ORAL | Status: DC
  Administered 2023-04-27 – 2023-05-06 (×10): 10 mg via ORAL
  Filled 2023-04-27 (×13): qty 1

## 2023-04-27 MED ORDER — NICOTINE POLACRILEX 2 MG MT GUM
2.0000 mg | CHEWING_GUM | OROMUCOSAL | Status: DC | PRN
  Administered 2023-04-28 – 2023-05-02 (×3): 2 mg via ORAL
  Filled 2023-04-27 (×2): qty 1

## 2023-04-27 MED ORDER — CHLORPROMAZINE HCL 25 MG PO TABS
125.0000 mg | ORAL_TABLET | Freq: Every day | ORAL | Status: DC
  Administered 2023-04-27 – 2023-04-28 (×2): 125 mg via ORAL
  Filled 2023-04-27 (×3): qty 1

## 2023-04-27 NOTE — Progress Notes (Signed)
[facility] Post 1:1 Observation Documentation  For the first (8) hours following discontinuation of 1:1 precautions, a progress note entry by nursing staff should be documented at least every 2 hours, reflecting the patient's behavior, condition, mood, and conversation.  Use the progress notes for additional entries.  Time 1:1 discontinued:  12 PM  Patient's Behavior:  Patient is calm and cooperative  Patient's Condition:  Patient is alert and oriented x 3  Patient's Conversation:  Patient is visible in milieu.  Audrie Lia Geoffry Bannister 04/27/2023, 7:00 PM

## 2023-04-27 NOTE — Group Note (Unsigned)
Date:  04/27/2023 Time:  8:32 PM  Group Topic/Focus:  Wrap-Up Group:   The focus of this group is to help patients review their daily goal of treatment and discuss progress on daily workbooks.     Participation Level:  {[facility] PARTICIPATION UEAVW:09811}  Participation Quality:  {[facility] PARTICIPATION QUALITY:22265}  Affect:  {[facility] AFFECT:22266}  Cognitive:  {[facility] COGNITIVE:22267}  Insight: {BHH Insight2:20797}  Engagement in Group:  {[facility] ENGAGEMENT IN BJYNW:29562}  Modes of Intervention:  {[facility] MODES OF INTERVENTION:22269}  Additional Comments:  ***  Scot Dock 04/27/2023, 8:32 PM

## 2023-04-27 NOTE — Group Note (Signed)
Date:  04/27/2023 Time:  9:10 PM  Group Topic/Focus:  Wrap-Up Group:   The focus of this group is to help patients review their daily goal of treatment and discuss progress on daily workbooks.    Participation Level:  Active  Participation Quality:  Appropriate  Affect:  Appropriate  Cognitive:  Oriented  Insight: Appropriate  Engagement in Group:  Engaged  Modes of Intervention:  Education and Exploration  Additional Comments:  Patient attended and participated in group tonight. He reports that his goal was to go home. Today he had a very brief talk with his doctor about discharge plan. He was told that after talking to some additional people he will let him know.  Lita Mains [facility] 04/27/2023, 9:10 PM

## 2023-04-27 NOTE — Plan of Care (Signed)

## 2023-04-27 NOTE — Progress Notes (Signed)
Adult Psychoeducational Group Note  Date:  04/27/2023 Time:  9:43 AM  Group Topic/Focus:  Goals Group:   The focus of this group is to help patients establish daily goals to achieve during treatment and discuss how the patient can incorporate goal setting into their daily lives to aide in recovery.  Participation Level:  Active  Participation Quality:  Appropriate  Affect:  Appropriate  Cognitive:  Appropriate  Insight: Appropriate  Engagement in Group:  Engaged  Modes of Intervention:  Discussion  Additional Comments: The patient engaged in group.  Octavio Manns 04/27/2023, 9:43 AM

## 2023-04-27 NOTE — Progress Notes (Signed)
[facility] Post 1:1 Observation Documentation  For the first (8) hours following discontinuation of 1:1 precautions, a progress note entry by nursing staff should be documented at least every 2 hours, reflecting the patient's behavior, condition, mood, and conversation.  Use the progress notes for additional entries.  Time 1:1 discontinued:  12 PM  Patient's Behavior:  Patient is calm and appropriate.  Patient's Condition:  Alert and oriented x 3.  Patient's Conversation:  Patient is visible in milieu.  Matthew Padilla 04/27/2023, 5:23 PM

## 2023-04-27 NOTE — Progress Notes (Signed)
[facility] Post 1:1 Observation Documentation  For the first (8) hours following discontinuation of 1:1 precautions, a progress note entry by nursing staff should be documented at least every 2 hours, reflecting the patient's behavior, condition, mood, and conversation.  Use the progress notes for additional entries.  Time 1:1 discontinued:  12 PM  Patient's Behavior:  Patient is calm and appropriate on and off the unit.  Patient's Condition:  Patient is alert and oriented x 3  Patient's Conversation:  Patient in the dayroom interacting with staff.  Audrie Lia Candis Kabel 04/27/2023, 5:21 PM

## 2023-04-27 NOTE — Progress Notes (Signed)
[facility] MD Progress Note  04/27/2023 12:10 PM Matthew Padilla  MRN:  [id number] Principal Problem: Schizophrenia (HCC) Diagnosis: Principal Problem:   Schizophrenia (HCC)   Subjective:   Matthew Padilla is a 27 year-old male with past psychiatric history of schizophrenia, paranoid type and past medical history of hypogonadism. He presented to [facility] ED for psychosis and tachycardia and found to have mild rhabdomyolysis. Patient was medically stabilized and admitted to inpatient [facility] under IVC for possible exacerbation of schizophrenia.   Case was discussed in the multidisciplinary team. MAR was reviewed and patient was compliant with medications.   PRNs in the last 24 hours: 07/22 2028 hydrOXYzine tablet 25 mg 07/22 2028 trazodone 50 mg 07/22 2320 ativan 2 mg 07/22 2320 thorazine 50 mg  Psychiatric Team made the following recs yesterday: -- Continue Risperdal m-tabs, 4 mg oral dissolvable tablets BID for acute psychosis -- Decrease thorazine to 37.5 mg disintegrating tablet at 0700, 1500 (first dose scheduled for 1500 on 7/22) daily for psychosis -- Continue thorazine 100 mg tablet at 2200 daily for psychosis             -- Change Propranolol to ER 120 mg daily for tachycardia, blood pressure, anxiolysis.             -- Change PRNs diphenhydramine 50 mg / lorazepam 2 mg / thorazine 25 mg injection / 50 mg oral tablet  Today on morning interview, patient reports feeling "pretty good." Sleep is good. Denies medication side effects.   Patient was much calmer and more coherent this morning on exam than since admission. He answered questions clearly, had better eye contact, and made few bizarre remarks. Reports from nursing indicate that he continues to pace, but is more respectful of other patients' space and boundaries. He has stopped trying to open doors and go into other patient rooms. They felt that he was ready to not have 1:1 supervision.   Denies depression and anxiety. Denies  physical concerns. Denies paranoia. Denies auditory hallucinations, suicidal thoughts, and homicidal ideation. Patient still responding to visual hallucinations, he seemed to be following a bug that only he could see. He mentioned this as the same bug that caused him to punch the wall and ceiling.   He denies any muscle stiffness or involuntary muscle movements. He has right heel pain on standing, which the patient identified as his "achilles tendon." He said that the pain is new in the last few days, but is not accompanied by any muscle movements. He said that the pain goes away when he stands up.  Sleep: Good Appetite:  Good Depression: Denies Anxiety: Denies Auditory Hallucinations: Denies, but is still responding to a bug that no one else can see.  Visual Hallucinations: Denies Paranoia: Denies [id number] SI: Denies Side effects from medications: does not endorse any side-effects they attribute to medications.  Total time spent with patient: 20 minutes  Past psychiatric history:  Diagnosed with schizophrenia in 2022 after previous hospitalization for psychosis. Pt has experienced past psychotic episodes in the summer months and depressive symptoms in the fall. Two total prior psychiatric hospitalizations in the past, with the most recent in 2022 for bizarre behavior and psychosis.   Past Medical History:  Past Medical History:  Diagnosis Date   Anxiety    Low testosterone in male    Paranoid schizophrenia (HCC)     History reviewed. No pertinent surgical history. Family History: History reviewed. No pertinent family history. Family Psychiatric History:  fmhx of bipolar disorder  in patient's aunt well-controlled on lithium.   Social History:  Social History   Substance and Sexual Activity  Alcohol Use Not Currently     Social History   Substance and Sexual Activity  Drug Use Yes   Types: Other-see comments    Social History   Socioeconomic History   Marital status:  Single    Spouse name: Not on file   Number of children: Not on file   Years of education: Not on file   Highest education level: Not on file  Occupational History   Not on file  Tobacco Use   Smoking status: Never   Smokeless tobacco: Never  Substance and Sexual Activity   Alcohol use: Not Currently   Drug use: Yes    Types: Other-see comments   Sexual activity: Never  Other Topics Concern   Not on file  Social History Narrative   Not on file   Social Determinants of Health   Financial Resource Strain: Not on file  Food Insecurity: Patient Declined (04/19/2023)   Hunger Vital Sign    Worried About Running Out of Food in the Last Year: Patient declined    Ran Out of Food in the Last Year: Patient declined  Transportation Needs: Patient Declined (04/19/2023)   PRAPARE - Administrator, Civil Service (Medical): Patient declined    Lack of Transportation (Non-Medical): Patient declined  Physical Activity: Not on file  Stress: Not on file  Social Connections: Not on file   Additional Social History: Collateral: Spoke with patient's mother Diontay Rosencrans [phone] at 11:45 for approximately 15 minutes. She agreed that Matthew Padilla has started to improve, but stated that he is not at his baseline. He is still not able to hold a longer conversation, which he is capable of at baseline. Updated her on our treatment plans and shared that we are hoping to wean him off one of the two antipsychotics that he is currently on before discharge. She asked whether his scheduled wisdom tooth extraction on August 8th should be pushed back. Told her that it would not be a bad idea, but that our hope was to have him discharged sooner than that. Also mentioned [name]'s request to have more supportive shoes to assist with his foot pain.  Current Medications: Current Facility-Administered Medications  Medication Dose Route Frequency Provider Last Rate Last Admin   acetaminophen (TYLENOL) tablet 650  mg  650 mg Oral Q6H PRN Eligha Bridegroom, NP   650 mg at 04/26/23 0816   alum & mag hydroxide-simeth (MAALOX/MYLANTA) 200-200-20 MG/5ML suspension 30 mL  30 mL Oral Q4H PRN Eligha Bridegroom, NP   30 mL at 04/25/23 1115   chlorproMAZINE (THORAZINE) injection 25 mg  25 mg Intramuscular TID PRN Massengill, Harrold Donath, MD       chlorproMAZINE (THORAZINE) tablet 100 mg  100 mg Oral QHS Margaretmary Dys, MD   100 mg at 04/26/23 2028   chlorproMAZINE (THORAZINE) tablet 37.5 mg  37.5 mg Oral BID Margaretmary Dys, MD   37.5 mg at 04/27/23 0559   chlorproMAZINE (THORAZINE) tablet 50 mg  50 mg Oral TID PRN Phineas Inches, MD   50 mg at 04/26/23 2320   cloNIDine (CATAPRES) tablet 0.1 mg  0.1 mg Oral Q4H PRN Margaretmary Dys, MD       hydrOXYzine (ATARAX) tablet 25 mg  25 mg Oral TID PRN Phineas Inches, MD   25 mg at 04/26/23 2028   loratadine (CLARITIN) tablet 10  mg  10 mg Oral Daily Margaretmary Dys, MD       LORazepam (ATIVAN) tablet 2 mg  2 mg Oral TID PRN Phineas Inches, MD   2 mg at 04/26/23 2320   Or   LORazepam (ATIVAN) injection 2 mg  2 mg Intramuscular TID PRN Massengill, Harrold Donath, MD       magnesium hydroxide (MILK OF MAGNESIA) suspension 30 mL  30 mL Oral Daily PRN Eligha Bridegroom, NP       metFORMIN (GLUCOPHAGE-XR) 24 hr tablet 500 mg  500 mg Oral Q breakfast Margaretmary Dys, MD   500 mg at 04/27/23 0802   propranolol ER (INDERAL LA) 24 hr capsule 120 mg  120 mg Oral Daily Margaretmary Dys, MD   120 mg at 04/27/23 0802   risperiDONE (RISPERDAL M-TABS) disintegrating tablet 4 mg  4 mg Oral BID Margaretmary Dys, MD   4 mg at 04/27/23 0802   traZODone (DESYREL) tablet 200 mg  200 mg Oral QHS PRN Phineas Inches, MD   200 mg at 04/26/23 2028    Lab Results:  No results found for this or any previous visit (from the past 48 hour(s)).   Blood Alcohol level:  Lab Results  Component  Value Date   ETH <10 04/18/2023   ETH <10 07/08/2021    Metabolic Disorder Labs: Lab Results  Component Value Date   HGBA1C 5.4 04/22/2023   MPG 108 04/22/2023   Lab Results  Component Value Date   PROLACTIN 22.3 (H) 09/11/2022   Lab Results  Component Value Date   CHOL 126 04/22/2023   TRIG 113 04/22/2023   HDL 21 (L) 04/22/2023   CHOLHDL 6.0 04/22/2023   VLDL 23 04/22/2023   LDLCALC 82 04/22/2023    Physical Findings: AIMS: Facial and Oral Movements Muscles of Facial Expression: None, normal Lips and Perioral Area: None, normal Jaw: None, normal Tongue: None, normal,Extremity Movements Upper (arms, wrists, hands, fingers): None, normal Lower (legs, knees, ankles, toes): None, normal, Trunk Movements Neck, shoulders, hips: None, normal, Overall Severity Severity of abnormal movements (highest score from questions above): None, normal Incapacitation due to abnormal movements: None, normal Patient's awareness of abnormal movements (rate only patient's report): No Awareness, Dental Status Current problems with teeth and/or dentures?: No Does patient usually wear dentures?: No   Musculoskeletal: Strength & Muscle Tone: within normal limits Gait & Station: normal Patient leans: N/A  Psychiatric Specialty Exam:  Presentation  General Appearance:   Casual   Eye Contact:  Fair  Speech:  Normal Rate; Clear and Coherent  Speech Volume:  Normal  Handedness:  Right    Mood and Affect  Mood:  Euthymic  Affect:  Blunt; Congruent   Thought Process  Thought Processes:  Disorganized  Descriptions of Associations: Loose  Orientation: Partial  Thought Content: Tangential  History of Schizophrenia/Schizoaffective disorder: Yes  Duration of Psychotic Symptoms: Greater than six months  Hallucinations: Hallucinations: Visual Description of Visual Hallucinations: Following a non-existent bug around the room with his eyes.  Ideas of Reference:  None  Suicidal Thoughts: Suicidal Thoughts: No  Homicidal Thoughts: Homicidal Thoughts: No  Sensorium  Memory: Immediate Fair; Remote Good; Recent Fair  Judgment:  Fair  Insight:  Fair   Chartered certified accountant:  Fair  Attention Span:  Fair  Recall:  Fair  Fund of Knowledge:  Good  Language:  Good  Psychomotor Activity  Psychomotor Activity:  Psychomotor Activity: Normal AIMS Completed?: Yes  Physical Exam: Physical Exam Vitals and nursing note reviewed. Exam conducted with a chaperone present.  Constitutional:      General: He is awake. He is not in acute distress.    Appearance: He is obese.  HENT:     Head: Normocephalic and atraumatic.  Pulmonary:     Effort: Pulmonary effort is normal. No respiratory distress.  Musculoskeletal:        General: Normal range of motion.     Cervical back: Normal range of motion.  Neurological:     Mental Status: He is alert.     Motor: Motor function is intact.     Gait: Gait is intact.  Psychiatric:        Behavior: Behavior is cooperative.     Review of Systems  Respiratory:  Negative for shortness of breath.   Cardiovascular:  Negative for chest pain.  Gastrointestinal:  Negative for abdominal pain, nausea and vomiting.  Musculoskeletal:  Negative for falls.       Pt has ingrown toenail on R big toe. Pt said this was better today 7/23. Pt reports right heel pain.  Neurological:  Negative for tremors.  Psychiatric/Behavioral:  Negative for depression and suicidal ideas. The patient is not nervous/anxious and does not have insomnia.     Blood pressure 125/80, pulse 94, temperature 98.7 F (37.1 C), temperature source Oral, resp. rate 20, height 6\' 5"  (1.956 m), weight (!) 171 kg, SpO2 99%. Body mass index is 44.71 kg/m.   Treatment Plan  ASSESSMENT: Jamez Ambrocio is a 27 year-old male with past psychiatric history of schizophrenia, paranoid type and past medical history of hypogonadism.  He presented to [facility] ED for tachycardia and found to have mild rhabdomyolysis. Patient was medically stabilized and admitted to inpatient [facility] under IVC for possible exacerbation of schizophrenia.    Patient accurately states the name of his outpatient psychiatrist Dr. Kathryne Sharper. He notes his last visit was several months ago although on chart review his last virtual follow up was on 7/3. At the time, patient was reportedly taking Zoloft 50 mg and Risperdal 1 mg as per Dr. Clarise Cruz note, but patient denies taking all medications for the last several months as they were not working for him. He has had 2 prior psychiatric hospitalizations in the past, with the most recent in 2022 for bizarre behavior and psychosis.   7/23: Patient remains disorganized with scattered thought process and thought content on exam this morning. He got one PRN overnight for Thorazine 50 mg tablet, but slept 7.5 hours, which is an improvement. He is demonstrating better self-control this morning and is doing better on the acute unit. He is no longer requiring 1:1 supervision from nursing for agitation, which is a significant improvement. Spoke with his mother [name] today who said that he is still not at his baseline, but that he is improved relative to previous days. She had a few questions about medication management and making sure that his outpatient psychiatrist is involved. Informed her that the notes here should be visible to his outpatient psychiatrist since he is within the same [facility] system.  Since the PRN thorazine has been required on two successive nights, the plan is to increase his nighttime dose of thorazine 125 mg.  A key to his remaining well-adjusted in the outside world will be getting through to him about how psychoactive mushrooms are not regulated or tested and how much this illicit drug use puts him at risk for decompensation.  Diagnoses / Active Problems: -- Schizophrenia vs Schizoaffective Disorder --  Substance-Induced Psychosis   PLAN: Safety and Monitoring:             -- In-Voluntary admission to inpatient psychiatric unit for safety, stabilization and treatment             -- Daily contact with patient to assess and evaluate symptoms and progress in treatment             -- Patient's case to be discussed in multi-disciplinary team meeting             -- Observation Level : 1:1 continued             -- Vital signs:  q12 hours             -- Precautions: suicide, elopement, and assault   2. Psychiatric Diagnoses and Treatment:  -- Continue Risperdal m-tabs, 4 mg oral dissolvable tablets BID for acute psychosis -- Continue daytime thorazine to 37.5 mg disintegrating tablet at 0700, 1500 (first dose scheduled for 1500 on 7/22) daily for psychosis -- Increase thorazine to 125 mg tablet at 2200 daily for psychosis             -- Change Propranolol to ER 120 mg daily for tachycardia, blood pressure, anxiolysis.             -- Change PRNs diphenhydramine 50 mg / lorazepam 2 mg / thorazine 25 mg injection / 50 mg oral tablet              -- The risks/benefits/side-effects/alternatives to this medication were discussed in detail with the patient and time was given for questions. The patient consents to medication trial.              -- Metabolic profile and EKG monitoring obtained while on an atypical antipsychotic (Body mass index is 44.71 kg/m, Lipid Panel: HDL 21, LDL 82, triglycerides 113, total 126, HbgA1c: 5.5, QTc:435 on 7/16)              -- Encouraged patient to participate in unit milieu and in scheduled group therapies              -- Short Term Goals: Ability to demonstrate self-control will improve and Ability to identify triggers associated with substance abuse/mental health issues will improve             -- Long Term Goals: Improvement in symptoms so as ready for discharge               3. Medical Issues Being Addressed:  -- Labs reviewed, notable for recovering  rhabdomyolysis, mild troponin leak, elevated AST, positive for THC.   -- Elevated blood glucose: Start metformin XR 500 mg once in the morning   4. Discharge Planning:              -- Social work and case management to assist with discharge planning and identification of hospital follow-up needs prior to discharge             -- Estimated LOS: 5-7 days             -- Discharge Concerns: Need to establish a safety plan; Medication compliance and effectiveness             -- Discharge Goals: Return home with outpatient referrals for mental health follow-up including medication management/psychotherapy  Signed: Christie Nottingham, MD [facility] Physician 04/27/2023 12:10 PM

## 2023-04-27 NOTE — Plan of Care (Signed)
  Problem: Education: Goal: Emotional status will improve Outcome: Progressing Goal: Mental status will improve Outcome: Progressing   Problem: Activity: Goal: Interest or engagement in activities will improve Outcome: Progressing   Problem: Safety: Goal: Periods of time without injury will increase Outcome: Progressing   Problem: Coping: Goal: Coping ability will improve Outcome: Progressing Goal: Will verbalize feelings Outcome: Progressing

## 2023-04-27 NOTE — Progress Notes (Signed)
[facility] Post 1:1 Observation Documentation  For the first (8) hours following discontinuation of 1:1 precautions, a progress note entry by nursing staff should be documented at least every 2 hours, reflecting the patient's behavior, condition, mood, and conversation.  Use the progress notes for additional entries.  Time 1:1 discontinued:  12 PM  Patient's Behavior:  Patient is calm and appropriate  Patient's Condition: Patient is alert and oriented x 3   Patient's Conversation:  Interacting well with staff and peers.  Matthew Padilla 04/27/2023, 5:18 PM

## 2023-04-27 NOTE — Progress Notes (Signed)
1:1 Note: Patient maintained on constant supervision for safety.  Patient is visible in milieu and dayroom watching TV.  Patient interacting well with staff and peers.  Attended group and participated.  Medications given as prescribed.  Routine safety checks maintained.

## 2023-04-27 NOTE — Progress Notes (Signed)
Nursing 1:1 note D:Pt observed sleeping in bed with eyes closed. RR even and unlabored. No distress noted. A: 1:1 observation continues for safety  R: pt remains safe  

## 2023-04-27 NOTE — Progress Notes (Signed)
Nursing 1:1 note D:Pt observed in dayroom getting his vital signs. RR even and unlabored. No distress noted. A: 1:1 observation continues for safety  R: pt remains safe

## 2023-04-27 NOTE — Group Note (Signed)
Recreation Therapy Group Note   Group Topic:Health and Wellness  Group Date: 04/27/2023 Start Time: 1040 End Time: 1120 Facilitators: Rosielee Corporan-McCall, LRT,CTRS Location: [address] Dayroom   Goal Area(s) Addresses:  Patient will verbalize benefit of exercise during group session. Patient will identify an exercise that can be completed post d/c. Patient will acknowledge benefits of exercise when used as a coping mechanism.   Group Description: Exercise. Patients and LRT discussed the importance of exercise on the body. Patients and LRT then stretched to get loosen up muscles. Patients then took turns leading the group in the exercises of their choosing. Patients were to between 20-30 minutes of exercise. Patients were to take breaks or get water as needed.  Affect/Mood: Appropriate   Participation Level: Engaged   Participation Quality: Independent   Behavior: Appropriate   Speech/Thought Process: Focused   Insight: Moderate   Judgement: Moderate   Modes of Intervention: Music   Patient Response to Interventions:  Engaged   Education Outcome:  Acknowledges education   Clinical Observations/Individualized Feedback: Pt was engaged and focused. Pt wasn't all over the place or needed redirection. Pt was attentive and completed the exercises presented in group session. Pt was bright and respectful throughout group session.     Plan: Continue to engage patient in RT group sessions 2-3x/week.   Malkie Wille-McCall, LRT,CTRS 04/27/2023 11:57 AM

## 2023-04-27 NOTE — Progress Notes (Signed)
   [facility] Post 1:1 Observation Documentation   For the first (8) hours following discontinuation of 1:1 precautions, a progress note entry by nursing staff should be documented at least every 2 hours, reflecting the patient's behavior, condition, mood, and conversation.  Use the progress notes for additional entries.   Time 1:1 discontinued:  12 PM   Patient's Behavior:  Patient is calm and appropriate , anxious at times , Patient is visible in milieu.   Patient's Condition:  Alert and oriented x 3.   Patient's Conversation:  coherent, Tangential at times

## 2023-04-27 NOTE — Progress Notes (Signed)
Pt very anxious on the unit this evening, pt appearing to have hard time coping without someone with him all the time, pt appearing paranoid at times    04/27/23 2130  Psych Admission Type (Psych Patients Only)  Admission Status Involuntary  Psychosocial Assessment  Patient Complaints Anxiety;Restlessness  Eye Contact Brief  Facial Expression Flat  Affect Preoccupied  Speech Tangential  Interaction Attention-seeking  Motor Activity Fidgety;Restless;Pacing  Appearance/Hygiene Unremarkable  Behavior Characteristics Cooperative  Mood Suspicious;Anxious;Pleasant;Preoccupied  Aggressive Behavior  Effect No apparent injury  Thought Process  Coherency Disorganized;Tangential  Content Paranoia  Delusions Paranoid  Perception Hallucinations  Hallucination Visual  Judgment Limited  Confusion Mild  Danger to Self  Current suicidal ideation? Denies  Danger to Others  Danger to Others None reported or observed

## 2023-04-28 DIAGNOSIS — F203 Undifferentiated schizophrenia: Secondary | ICD-10-CM | POA: Diagnosis not present

## 2023-04-28 LAB — CK: Total CK: 223 U/L (ref 49–397)

## 2023-04-28 LAB — CBC WITH DIFFERENTIAL/PLATELET
Abs Immature Granulocytes: 0.02 10*3/uL (ref 0.00–0.07)
Basophils Absolute: 0 10*3/uL (ref 0.0–0.1)
Basophils Relative: 1 %
Eosinophils Absolute: 0.1 10*3/uL (ref 0.0–0.5)
Eosinophils Relative: 1 %
HCT: 43.2 % (ref 39.0–52.0)
Hemoglobin: 13.6 g/dL (ref 13.0–17.0)
Immature Granulocytes: 0 %
Lymphocytes Relative: 41 %
Lymphs Abs: 2.4 10*3/uL (ref 0.7–4.0)
MCH: 24.5 pg — ABNORMAL LOW (ref 26.0–34.0)
MCHC: 31.5 g/dL (ref 30.0–36.0)
MCV: 77.8 fL — ABNORMAL LOW (ref 80.0–100.0)
Monocytes Absolute: 0.5 10*3/uL (ref 0.1–1.0)
Monocytes Relative: 9 %
Neutro Abs: 2.8 10*3/uL (ref 1.7–7.7)
Neutrophils Relative %: 48 %
Platelets: 212 10*3/uL (ref 150–400)
RBC: 5.55 MIL/uL (ref 4.22–5.81)
RDW: 16.3 % — ABNORMAL HIGH (ref 11.5–15.5)
WBC: 5.8 10*3/uL (ref 4.0–10.5)
nRBC: 0 % (ref 0.0–0.2)

## 2023-04-28 LAB — COMPREHENSIVE METABOLIC PANEL
ALT: 48 U/L — ABNORMAL HIGH (ref 0–44)
AST: 28 U/L (ref 15–41)
Albumin: 3.7 g/dL (ref 3.5–5.0)
Alkaline Phosphatase: 45 U/L (ref 38–126)
Anion gap: 11 (ref 5–15)
BUN: 10 mg/dL (ref 6–20)
CO2: 19 mmol/L — ABNORMAL LOW (ref 22–32)
Calcium: 8.9 mg/dL (ref 8.9–10.3)
Chloride: 107 mmol/L (ref 98–111)
Creatinine, Ser: 0.81 mg/dL (ref 0.61–1.24)
GFR, Estimated: >60 mL/min (ref >60)
Glucose, Bld: 91 mg/dL (ref 70–99)
Potassium: 4 mmol/L (ref 3.5–5.1)
Sodium: 137 mmol/L (ref 135–145)
Total Bilirubin: 0.7 mg/dL (ref 0.3–1.2)
Total Protein: 6.5 g/dL (ref 6.5–8.1)

## 2023-04-28 LAB — AMMONIA: Ammonia: 44 umol/L — ABNORMAL HIGH (ref 9–35)

## 2023-04-28 NOTE — Group Note (Signed)
Recreation Therapy Group Note   Group Topic:Leisure Education  Group Date: 04/28/2023 Start Time: 1000 End Time: 1030 Facilitators: Nikiah Goin-McCall, LRT,CTRS Location: [address] Dayroom   Goal Area(s) Addresses:  Patient will successfully identify positive leisure and recreation activities.  Patient will acknowledge benefits of participation in healthy leisure activities post discharge.  Patient will actively work with peers toward a shared goal.    Group Description: Pictionary. In groups of 5-7, patients took turns trying to guess the picture being drawn on the board by their teammate.  If the team guessed the correct answer, they won a point.  If the team guessed wrong, the other team got a chance to steal the point. After several rounds of game play, the team with the most points were declared winners. Post-activity discussion reviewed benefits of positive recreation outlets: reducing stress, improving coping mechanisms, increasing self-esteem, and building larger support systems.    Affect/Mood: Appropriate   Participation Level: Engaged   Participation Quality: Independent   Behavior: Appropriate   Speech/Thought Process: Focused   Insight: Good   Judgement: Good   Modes of Intervention: Competitive Play   Patient Response to Interventions:  Engaged   Education Outcome:  Acknowledges education   Clinical Observations/Individualized Feedback: Pt was bright and focused during activity. Pt was very skillful in how he did his drawings. Pt engaged well with peers and was appropriate throughout group.     Plan: Continue to engage patient in RT group sessions 2-3x/week.   Apollonia Amini-McCall, LRT,CTRS  04/28/2023 11:06 AM

## 2023-04-28 NOTE — [facility] Group Notes (Signed)
Adult Psychoeducational Group Note  Date:  04/28/2023 Time:  8:56 PM  Group Topic/Focus:  Wrap-Up Group:   The focus of this group is to help patients review their daily goal of treatment and discuss progress on daily workbooks.  Participation Level:  Active  Participation Quality:  Appropriate  Affect:  Appropriate  Cognitive:  Appropriate  Insight: Appropriate  Engagement in Group:  Engaged  Modes of Intervention:  Discussion  Additional Comments:  Pt attended group.  Joselyn Arrow 04/28/2023, 8:56 PM

## 2023-04-28 NOTE — Progress Notes (Signed)
[facility] MD Progress Note  04/28/2023 11:08 AM Matthew Padilla  MRN:  [id number] Principal Problem: Schizophrenia (HCC) Diagnosis: Principal Problem:   Schizophrenia (HCC)   Subjective:   Matthew Padilla is a 27 year-old male with past psychiatric history of schizophrenia, paranoid type and past medical history of hypogonadism. He presented to [facility] ED for psychosis and tachycardia and found to have mild rhabdomyolysis. Patient was medically stabilized and admitted to inpatient [facility] under IVC for possible exacerbation of schizophrenia.   Case was discussed in the multidisciplinary team. MAR was reviewed and patient was compliant with medications.   PRNs in the last 24 hours: 07/23 2041  trazodone 200 mg   Psychiatric Team made the following recs yesterday: -- Continue Risperdal m-tabs, 4 mg oral dissolvable tablets BID for acute psychosis -- Continue daytime thorazine to 37.5 mg disintegrating tablet at 0700, 1500 (first dose scheduled for 1500 on 7/22) daily for psychosis -- Increase thorazine to 125 mg tablet at 2200 daily for psychosis             -- Change Propranolol to ER 120 mg daily for tachycardia, blood pressure, anxiolysis.             -- Change PRNs diphenhydramine 50 mg / lorazepam 2 mg / thorazine 25 mg injection / 50 mg oral tablet  Today on morning interview, patient reports feeling "good." Sleep is good. Denies medication side effects.   Patient was calmer than early in his admission, but when brought to his room, he was trying to engage another patient in a discussion that was growing a little heated. Pt denied a problem with that or any other patient, and said that he was trying to be good.   Denies depression and anxiety. States his appetite and bowel movements are normal. Denies paranoia. Denies auditory hallucinations, suicidal thoughts, and homicidal ideation. Pt having some clanging associations and going off on tangents "[id number]." Has not done on  previous days.  He denies any muscle stiffness or involuntary muscle movements. Patient stated that his heel pain is improved since yesterday. Patient was asked whether his mom had brought him the new shoes discussed on the phone, and he replied "yes, see them?" They were the same slippers he has worn since admission.  Sleep: Good Appetite:  Good Depression: Denies Anxiety: Denies Auditory Hallucinations: Denies. No signs. Visual Hallucinations: Denies. No signs. Paranoia: Denies [id number] SI: Denies Side effects from medications: does not endorse any side-effects they attribute to medications.  Total time spent with patient: 20 minutes  Past psychiatric history:  Diagnosed with schizophrenia in 2022 after previous hospitalization for psychosis. Pt has experienced past psychotic episodes in the summer months and depressive symptoms in the fall. Two total prior psychiatric hospitalizations in the past, with the most recent in 2022 for bizarre behavior and psychosis.   Past Medical History:  Past Medical History:  Diagnosis Date   Anxiety    Low testosterone in male    Paranoid schizophrenia (HCC)     History reviewed. No pertinent surgical history. Family History: History reviewed. No pertinent family history. Family Psychiatric History:  fmhx of bipolar disorder in patient's aunt well-controlled on lithium.   Social History:  Social History   Substance and Sexual Activity  Alcohol Use Not Currently     Social History   Substance and Sexual Activity  Drug Use Yes   Types: Other-see comments    Social History   Socioeconomic History   Marital status: Single  Spouse name: Not on file   Number of children: Not on file   Years of education: Not on file   Highest education level: Not on file  Occupational History   Not on file  Tobacco Use   Smoking status: Never   Smokeless tobacco: Never  Substance and Sexual Activity   Alcohol use: Not Currently   Drug use: Yes     Types: Other-see comments   Sexual activity: Never  Other Topics Concern   Not on file  Social History Narrative   Not on file   Social Determinants of Health   Financial Resource Strain: Not on file  Food Insecurity: Patient Declined (04/19/2023)   Hunger Vital Sign    Worried About Running Out of Food in the Last Year: Patient declined    Ran Out of Food in the Last Year: Patient declined  Transportation Needs: Patient Declined (04/19/2023)   PRAPARE - Administrator, Civil Service (Medical): Patient declined    Lack of Transportation (Non-Medical): Patient declined  Physical Activity: Not on file  Stress: Not on file  Social Connections: Not on file   Additional Social History:   Current Medications: Current Facility-Administered Medications  Medication Dose Route Frequency Provider Last Rate Last Admin   acetaminophen (TYLENOL) tablet 650 mg  650 mg Oral Q6H PRN Eligha Bridegroom, NP   650 mg at 04/26/23 0816   alum & mag hydroxide-simeth (MAALOX/MYLANTA) 200-200-20 MG/5ML suspension 30 mL  30 mL Oral Q4H PRN Eligha Bridegroom, NP   30 mL at 04/25/23 1115   chlorproMAZINE (THORAZINE) injection 25 mg  25 mg Intramuscular TID PRN Massengill, Harrold Donath, MD       chlorproMAZINE (THORAZINE) tablet 125 mg  125 mg Oral QHS Margaretmary Dys, MD   125 mg at 04/27/23 2041   chlorproMAZINE (THORAZINE) tablet 37.5 mg  37.5 mg Oral BID Margaretmary Dys, MD   37.5 mg at 04/28/23 8295   chlorproMAZINE (THORAZINE) tablet 50 mg  50 mg Oral TID PRN Phineas Inches, MD   50 mg at 04/26/23 2320   cloNIDine (CATAPRES) tablet 0.1 mg  0.1 mg Oral Q4H PRN Margaretmary Dys, MD       hydrOXYzine (ATARAX) tablet 25 mg  25 mg Oral TID PRN Phineas Inches, MD   25 mg at 04/26/23 2028   loratadine (CLARITIN) tablet 10 mg  10 mg Oral Daily Margaretmary Dys, MD   10 mg at 04/28/23 6213   LORazepam (ATIVAN) tablet 2 mg  2 mg Oral TID  PRN Phineas Inches, MD   2 mg at 04/26/23 2320   Or   LORazepam (ATIVAN) injection 2 mg  2 mg Intramuscular TID PRN Massengill, Harrold Donath, MD       magnesium hydroxide (MILK OF MAGNESIA) suspension 30 mL  30 mL Oral Daily PRN Eligha Bridegroom, NP       metFORMIN (GLUCOPHAGE-XR) 24 hr tablet 500 mg  500 mg Oral Q breakfast Margaretmary Dys, MD   500 mg at 04/28/23 0865   nicotine polacrilex (NICORETTE) gum 2 mg  2 mg Oral PRN Massengill, Harrold Donath, MD       propranolol ER (INDERAL LA) 24 hr capsule 120 mg  120 mg Oral Daily Margaretmary Dys, MD   120 mg at 04/28/23 7846   risperiDONE (RISPERDAL M-TABS) disintegrating tablet 4 mg  4 mg Oral BID Margaretmary Dys, MD   4 mg at 04/28/23 [phone]  traZODone (DESYREL) tablet 200 mg  200 mg Oral QHS PRN Phineas Inches, MD   200 mg at 04/27/23 2041    Lab Results:  Results for orders placed or performed during the hospital encounter of 04/19/23 (from the past 48 hour(s))  CBC with Differential/Platelet     Status: Abnormal   Collection Time: 04/28/23  6:48 AM  Result Value Ref Range   WBC 5.8 4.0 - 10.5 K/uL   RBC 5.55 4.22 - 5.81 MIL/uL   Hemoglobin 13.6 13.0 - 17.0 g/dL   HCT 13.0 86.5 - 78.4 %   MCV 77.8 (L) 80.0 - 100.0 fL   MCH 24.5 (L) 26.0 - 34.0 pg   MCHC 31.5 30.0 - 36.0 g/dL   RDW 69.6 (H) 29.5 - 28.4 %   Platelets 212 150 - 400 K/uL   nRBC 0.0 0.0 - 0.2 %   Neutrophils Relative % 48 %   Neutro Abs 2.8 1.7 - 7.7 K/uL   Lymphocytes Relative 41 %   Lymphs Abs 2.4 0.7 - 4.0 K/uL   Monocytes Relative 9 %   Monocytes Absolute 0.5 0.1 - 1.0 K/uL   Eosinophils Relative 1 %   Eosinophils Absolute 0.1 0.0 - 0.5 K/uL   Basophils Relative 1 %   Basophils Absolute 0.0 0.0 - 0.1 K/uL   Immature Granulocytes 0 %   Abs Immature Granulocytes 0.02 0.00 - 0.07 K/uL    Comment: Performed at [facility], [address], [place], [place] [address]  CK     Status: None   Collection  Time: 04/28/23  6:48 AM  Result Value Ref Range   Total CK 223 49 - 397 U/L    Comment: Performed at [facility], [address] [place], [place] [address]  Comprehensive metabolic panel     Status: Abnormal   Collection Time: 04/28/23  6:48 AM  Result Value Ref Range   Sodium 137 135 - 145 mmol/L   Potassium 4.0 3.5 - 5.1 mmol/L   Chloride 107 98 - 111 mmol/L   CO2 19 (L) 22 - 32 mmol/L   Glucose, Bld 91 70 - 99 mg/dL    Comment: Glucose reference range applies only to samples taken after fasting for at least 8 hours.   BUN 10 6 - 20 mg/dL   Creatinine, Ser 2.53 0.61 - 1.24 mg/dL   Calcium 8.9 8.9 - 66.4 mg/dL   Total Protein 6.5 6.5 - 8.1 g/dL   Albumin 3.7 3.5 - 5.0 g/dL   AST 28 15 - 41 U/L   ALT 48 (H) 0 - 44 U/L   Alkaline Phosphatase 45 38 - 126 U/L   Total Bilirubin 0.7 0.3 - 1.2 mg/dL   GFR, Estimated >40 >34 mL/min    Comment: (NOTE) Calculated using the CKD-EPI Creatinine Equation (2021)    Anion gap 11 5 - 15    Comment: Performed at [facility], [address], [place], [place] [address]  Ammonia     Status: Abnormal   Collection Time: 04/28/23  6:48 AM  Result Value Ref Range   Ammonia 44 (H) 9 - 35 umol/L    Comment: Performed at [facility], [address] [place], [place] [address]     Blood Alcohol level:  Lab Results  Component Value Date   [facility] <10 04/18/2023   ETH <10 07/08/2021    Metabolic Disorder Labs: Lab Results  Component Value Date   HGBA1C 5.4  04/22/2023   MPG 108 04/22/2023   Lab Results  Component Value Date   PROLACTIN 22.3 (H) 09/11/2022   Lab Results  Component Value Date   CHOL 126 04/22/2023   TRIG 113 04/22/2023   HDL 21 (L) 04/22/2023   CHOLHDL 6.0 04/22/2023   VLDL 23 04/22/2023   LDLCALC 82 04/22/2023    Physical Findings: AIMS: Facial and Oral Movements Muscles of Facial Expression: None, normal Lips and Perioral Area: None, normal Jaw: None,  normal Tongue: None, normal,Extremity Movements Upper (arms, wrists, hands, fingers): None, normal Lower (legs, knees, ankles, toes): None, normal, Trunk Movements Neck, shoulders, hips: None, normal, Overall Severity Severity of abnormal movements (highest score from questions above): None, normal Incapacitation due to abnormal movements: None, normal Patient's awareness of abnormal movements (rate only patient's report): No Awareness, Dental Status Current problems with teeth and/or dentures?: No Does patient usually wear dentures?: No   Musculoskeletal: Strength & Muscle Tone: within normal limits Gait & Station: normal Patient leans: N/A  Psychiatric Specialty Exam:  Presentation  General Appearance:   Casual   Eye Contact:  Good  Speech:  Clear and Coherent; Normal Rate  Speech Volume:  Normal  Handedness:  Right    Mood and Affect  Mood:  Dysphoric  Affect:  Blunt; Non-Congruent   Thought Process  Thought Processes:  Disorganized  Descriptions of Associations: Tangential  Orientation: None  Thought Content: Illogical; Scattered  History of Schizophrenia/Schizoaffective disorder: Yes  Duration of Psychotic Symptoms: Greater than six months  Hallucinations: Hallucinations: Visual Description of Visual Hallucinations: Following a non-existent bug around the room with his eyes.  Ideas of Reference: None  Suicidal Thoughts: Suicidal Thoughts: No  Homicidal Thoughts: Homicidal Thoughts: No  Sensorium  Memory: Immediate Fair; Recent Fair; Remote Fair  Judgment:  Fair  Insight:  Poor   Executive Functions  Concentration:  Fair  Attention Span:  Fair  Recall:  Fair  Fund of Knowledge:  Good  Language:  Good  Psychomotor Activity  Psychomotor Activity:  Psychomotor Activity: Normal AIMS Completed?: Yes   Physical Exam: Physical Exam Vitals and nursing note reviewed. Exam conducted with a chaperone present.   Constitutional:      General: He is awake. He is not in acute distress.    Appearance: He is obese.  HENT:     Head: Normocephalic and atraumatic.  Pulmonary:     Effort: Pulmonary effort is normal. No respiratory distress.  Musculoskeletal:        General: Normal range of motion.     Cervical back: Normal range of motion.  Neurological:     Mental Status: He is alert.     Motor: Motor function is intact.     Gait: Gait is intact.  Psychiatric:        Behavior: Behavior is cooperative.     Review of Systems  Respiratory:  Negative for shortness of breath.   Cardiovascular:  Negative for chest pain.  Gastrointestinal:  Negative for abdominal pain, nausea and vomiting.  Musculoskeletal:  Negative for falls.       Pt denied pain in right foot today.  Neurological:  Negative for tremors.  Psychiatric/Behavioral:  Negative for depression and suicidal ideas. The patient is not nervous/anxious and does not have insomnia.     Blood pressure 127/64, pulse 89, temperature 98.8 F (37.1 C), temperature source Oral, resp. rate 20, height 6\' 5"  (1.956 m), weight (!) 171 kg, SpO2 98%. Body mass index is 44.71 kg/m.  Treatment Plan  ASSESSMENT: Matthew Padilla is a 27 year-old male with past psychiatric history of schizophrenia, paranoid type and past medical history of hypogonadism. He presented to [facility] ED for tachycardia and found to have mild rhabdomyolysis. Patient was medically stabilized and admitted to inpatient [facility] under IVC for possible exacerbation of schizophrenia.    Patient accurately states the name of his outpatient psychiatrist Dr. Kathryne Sharper. He notes his last visit was several months ago although on chart review his last virtual follow up was on 7/3. At the time, patient was reportedly taking Zoloft 50 mg and Risperdal 1 mg as per Dr. Clarise Cruz note, but patient denies taking all medications for the last several months as they were not working for him. He has had 2  prior psychiatric hospitalizations in the past, with the most recent in 2022 for bizarre behavior and psychosis.   7/23: Patient remains disorganized with scattered thought process and thought content on exam this morning. He is demonstrating better self-control this morning and is doing better on the acute unit. He is no longer requiring 1:1 supervision from nursing for agitation, which is a significant improvement. Medication adjustment to nighttime thorazine 125 mg seems to have been successful, patient did not require additional PRNs.  If he can maintain the status quo for a day, will try to start reducing his thorazine daytime dosing to see how much of his psychosis can be controlled on the daytime risperdal alone. Would like to see him stabilized on one antipsychotic if possible.  A key to his remaining well-adjusted in the outside world will be getting through to him about how psychoactive mushrooms are not regulated or tested and how much this illicit drug use puts him at risk for decompensation. This will be difficult, as Rande's intelligence allows him to read extensively online and rationalize his experimentation.    Diagnoses / Active Problems: -- Schizophrenia vs Schizoaffective Disorder -- Substance-Induced Psychosis   PLAN: Safety and Monitoring:             -- In-Voluntary admission to inpatient psychiatric unit for safety, stabilization and treatment             -- Daily contact with patient to assess and evaluate symptoms and progress in treatment             -- Patient's case to be discussed in multi-disciplinary team meeting             -- Observation Level : standard on acute unit             -- Vital signs:  q12 hours             -- Precautions: suicide, elopement, and assault   2. Psychiatric Diagnoses and Treatment:  -- Continue Risperdal m-tabs, 4 mg oral dissolvable tablets BID for acute psychosis -- Continue daytime thorazine to 37.5 mg disintegrating tablet at  0700, 1500 (first dose scheduled for 1500 on 7/22) daily for psychosis -- Continue thorazine to 125 mg tablet at 2200 daily for psychosis             -- Change Propranolol to ER 120 mg daily for tachycardia, blood pressure, anxiolysis.             -- Change PRNs diphenhydramine 50 mg / lorazepam 2 mg / thorazine 25 mg injection / 50 mg oral tablet              -- The risks/benefits/side-effects/alternatives to this medication were  discussed in detail with the patient and time was given for questions. The patient consents to medication trial.              -- Metabolic profile and EKG monitoring obtained while on an atypical antipsychotic (Body mass index is 44.71 kg/m, Lipid Panel: HDL 21, LDL 82, triglycerides 113, total 126, HbgA1c: 5.5, QTc:435 on 7/16)              -- Encouraged patient to participate in unit milieu and in scheduled group therapies              -- Short Term Goals: Ability to demonstrate self-control will improve and Ability to identify triggers associated with substance abuse/mental health issues will improve             -- Long Term Goals: Improvement in symptoms so as ready for discharge               3. Medical Issues Being Addressed:  -- Labs reviewed, notable for recovering rhabdomyolysis, mild troponin leak, elevated AST, positive for THC.   -- Elevated blood glucose: Start metformin XR 500 mg once in the morning   4. Discharge Planning:              -- Social work and case management to assist with discharge planning and identification of hospital follow-up needs prior to discharge             -- Estimated LOS: 5-7 days             -- Discharge Concerns: Need to establish a safety plan; Medication compliance and effectiveness             -- Discharge Goals: Return home with outpatient referrals for mental health follow-up including medication management/psychotherapy  Signed: Christie Nottingham, MD [facility] Physician 04/28/2023 11:08 AM

## 2023-04-28 NOTE — Progress Notes (Signed)
   04/28/23 1113  Psych Admission Type (Psych Patients Only)  Admission Status Involuntary  Psychosocial Assessment  Patient Complaints Anxiety;Restlessness  Eye Contact Brief  Facial Expression Flat  Affect Preoccupied  Speech Tangential  Interaction Assertive  Motor Activity Fidgety;Pacing;Restless  Appearance/Hygiene Unremarkable  Behavior Characteristics Cooperative  Mood Anxious;Pleasant  Thought Process  Coherency Disorganized;Tangential  Content Preoccupation  Delusions Paranoid  Perception Hallucinations  Hallucination Visual  Judgment Limited  Confusion Mild  Danger to Self  Current suicidal ideation? Denies  Danger to Others  Danger to Others None reported or observed

## 2023-04-28 NOTE — Progress Notes (Signed)
   04/28/23 2100  Psych Admission Type (Psych Patients Only)  Admission Status Involuntary  Psychosocial Assessment  Patient Complaints Anxiety;Restlessness  Eye Contact Brief  Facial Expression Flat  Affect Preoccupied  Speech Tangential  Interaction Assertive  Motor Activity Pacing;Fidgety;Restless  Appearance/Hygiene Unremarkable  Behavior Characteristics Cooperative;Appropriate to situation  Mood Anxious;Pleasant  Thought Process  Coherency Disorganized;Tangential  Content Preoccupation  Delusions Paranoid  Perception Hallucinations  Hallucination Visual  Judgment Limited  Confusion Mild  Danger to Self  Current suicidal ideation? Denies  Danger to Others  Danger to Others None reported or observed

## 2023-04-28 NOTE — Group Note (Signed)
Date:  04/28/2023 Time:  9:43 AM  Group Topic/Focus:  Goals Group:   The focus of this group is to help patients establish daily goals to achieve during treatment and discuss how the patient can incorporate goal setting into their daily lives to aide in recovery.    Participation Level:  Active  Participation Quality:  Appropriate  Affect:  Flat  Cognitive:  Lacking  Insight: Improving  Engagement in Group:  Lacking  Modes of Intervention:  Limit-setting  Additional Comments:  Patient attended goals group but was very limited with engagement. Patient's goal take all his medication and attend all groups.   Matthew Padilla [name] 04/28/2023, 9:43 AM

## 2023-04-29 ENCOUNTER — Other Ambulatory Visit (HOSPITAL_COMMUNITY): Payer: Self-pay

## 2023-04-29 DIAGNOSIS — F203 Undifferentiated schizophrenia: Secondary | ICD-10-CM | POA: Diagnosis not present

## 2023-04-29 MED ORDER — CHLORPROMAZINE HCL 50 MG PO TABS
150.0000 mg | ORAL_TABLET | Freq: Every day | ORAL | Status: DC
  Administered 2023-04-29 – 2023-05-05 (×7): 150 mg via ORAL
  Filled 2023-04-29 (×8): qty 3

## 2023-04-29 MED ORDER — ALBUTEROL SULFATE HFA 108 (90 BASE) MCG/ACT IN AERS
2.0000 | INHALATION_SPRAY | Freq: Four times a day (QID) | RESPIRATORY_TRACT | Status: DC | PRN
  Administered 2023-05-01 – 2023-05-03 (×5): 2 via RESPIRATORY_TRACT
  Filled 2023-04-29: qty 6.7

## 2023-04-29 MED ORDER — CHLORPROMAZINE HCL 25 MG PO TABS
25.0000 mg | ORAL_TABLET | Freq: Two times a day (BID) | ORAL | Status: DC
  Administered 2023-04-29 – 2023-04-30 (×2): 25 mg via ORAL
  Filled 2023-04-29 (×6): qty 1

## 2023-04-29 NOTE — Plan of Care (Signed)
  Problem: Education: Goal: Knowledge of [place] [facility] Education information/materials will improve Outcome: Progressing Goal: Emotional status will improve Outcome: Progressing Goal: Mental status will improve Outcome: Progressing Goal: Verbalization of understanding the information provided will improve Outcome: Progressing   Problem: Activity: Goal: Interest or engagement in activities will improve Outcome: Progressing Goal: Sleeping patterns will improve Outcome: Progressing   Problem: Coping: Goal: Ability to verbalize frustrations and anger appropriately will improve Outcome: Progressing Goal: Ability to demonstrate self-control will improve Outcome: Progressing   Problem: Health Behavior/Discharge Planning: Goal: Identification of resources available to assist in meeting health care needs will improve Outcome: Progressing Goal: Compliance with treatment plan for underlying cause of condition will improve Outcome: Progressing   Problem: Physical Regulation: Goal: Ability to maintain clinical measurements within normal limits will improve Outcome: Progressing   Problem: Safety: Goal: Periods of time without injury will increase Outcome: Progressing   Problem: Education: Goal: Knowledge of [place] [facility] Education information/materials will improve Outcome: Progressing Goal: Emotional status will improve Outcome: Progressing Goal: Mental status will improve Outcome: Progressing Goal: Verbalization of understanding the information provided will improve Outcome: Progressing   Problem: Activity: Goal: Interest or engagement in activities will improve Outcome: Progressing Goal: Sleeping patterns will improve Outcome: Progressing   Problem: Coping: Goal: Ability to verbalize frustrations and anger appropriately will improve Outcome: Progressing Goal: Ability to demonstrate self-control will improve Outcome: Progressing   Problem: Health  Behavior/Discharge Planning: Goal: Identification of resources available to assist in meeting health care needs will improve Outcome: Progressing Goal: Compliance with treatment plan for underlying cause of condition will improve Outcome: Progressing   Problem: Physical Regulation: Goal: Ability to maintain clinical measurements within normal limits will improve Outcome: Progressing   Problem: Safety: Goal: Periods of time without injury will increase Outcome: Progressing   Problem: Coping: Goal: Coping ability will improve Outcome: Progressing Goal: Will verbalize feelings Outcome: Progressing   Problem: Coping: Goal: Coping ability will improve Outcome: Progressing Goal: Will verbalize feelings Outcome: Progressing   Problem: Health Behavior/Discharge Planning: Goal: Compliance with prescribed medication regimen will improve Outcome: Progressing   Problem: Nutritional: Goal: Ability to achieve adequate nutritional intake will improve Outcome: Progressing   Problem: Role Relationship: Goal: Ability to communicate needs accurately will improve Outcome: Progressing Goal: Ability to interact with others will improve Outcome: Progressing   Problem: Safety: Goal: Ability to redirect hostility and anger into socially appropriate behaviors will improve Outcome: Progressing Goal: Ability to remain free from injury will improve Outcome: Progressing   Problem: Coping: Goal: Ability to identify and develop effective coping behavior will improve Outcome: Progressing

## 2023-04-29 NOTE — Group Note (Signed)
Recreation Therapy Group Note   Group Topic:General Recreation  Group Date: 04/29/2023 Start Time: 0957 End Time: 1027 Facilitators: Alexcis Bicking-McCall, LRT,CTRS Location: [address] Dayroom   Goal Area(s) Addresses:  Patient will use appropriate interactions in play with peers.     Group Description: Keep It Going Volleyball.  Patients were placed in circle. Patients hit a beach ball to each other for as long as possible. LRT kept the time for how the ball was in motion. Patients were to keep the ball moving at all times. If the ball came to a stop at any point, LRT would restart the time. LRT also played music to give the activity a beach vibe.      Affect/Mood: Appropriate   Participation Level: Engaged   Participation Quality: Independent   Behavior: Appropriate   Speech/Thought Process: Focused   Insight: Good   Judgement: Good   Modes of Intervention: Cooperative Play   Patient Response to Interventions:  Engaged   Education Outcome:  Acknowledges education   Clinical Observations/Individualized Feedback: Patient played appropriately with peers, demonstrated no behavioral issues. Patient was focused and was on task throughout group session. Patients were instructed on the benefits of exercise, as well as, how often and for how long physical activity should be done to lead to a healthy lifestyle.    Plan: Continue to engage patient in RT group sessions 2-3x/week.   Peyten Weare-McCall, LRT,CTRS  04/29/2023 11:43 AM

## 2023-04-29 NOTE — Group Note (Signed)
Occupational Therapy Group Note  Group Topic: Sleep Hygiene  Group Date: 04/29/2023 Start Time: 1430 End Time: 1503 Facilitators: Ted Mcalpine, OT   Group Description: Group encouraged increased participation and engagement through topic focused on sleep hygiene. Patients reflected on the quality of sleep they typically receive and identified areas that need improvement. Group was given background information on sleep and sleep hygiene, including common sleep disorders. Group members also received information on how to improve one's sleep and introduced a sleep diary as a tool that can be utilized to track sleep quality over a length of time. Group session ended with patients identifying one or more strategies they could utilize or implement into their sleep routine in order to improve overall sleep quality.        Therapeutic Goal(s):  Identify one or more strategies to improve overall sleep hygiene  Identify one or more areas of sleep that are negatively impacted (sleep too much, too little, etc)     Participation Level: Engaged   Participation Quality: Independent   Behavior: Appropriate   Speech/Thought Process: Relevant   Affect/Mood: Appropriate   Insight: Improved   Judgement: Improved      Modes of Intervention: Education  Patient Response to Interventions:  Attentive   Plan: Continue to engage patient in OT groups 2 - 3x/week.  04/29/2023  Ted Mcalpine, OT   Kerrin Champagne, OT

## 2023-04-29 NOTE — Progress Notes (Signed)
[facility] MD Progress Note  04/29/2023 9:19 AM Matthew Padilla  MRN:  [id number] Principal Problem: Schizophrenia (HCC) Diagnosis: Principal Problem:   Schizophrenia (HCC)   Subjective:   Matthew Padilla is a 27 year-old male with past psychiatric history of schizophrenia, paranoid type and past medical history of hypogonadism. He presented to [facility] ED for psychosis and tachycardia and found to have mild rhabdomyolysis. Patient was medically stabilized and admitted to inpatient [facility] under IVC for possible exacerbation of schizophrenia.   Case was discussed in the multidisciplinary team. MAR was reviewed and patient was compliant with medications.   PRNs in the last 24 hours: 07/24 2028 trazodone 200 mg 07/25 0424 ativan 2 mg 07/25 0424 thorazine 50 mg  Psychiatric Team made the following recs yesterday: -- Continue Risperdal m-tabs, 4 mg oral dissolvable tablets BID for acute psychosis -- Continue daytime thorazine to 37.5 mg disintegrating tablet at 0700, 1500 (first dose scheduled for 1500 on 7/22) daily for psychosis -- Continue thorazine to 125 mg tablet at 2200 daily for psychosis             -- Change Propranolol to ER 120 mg daily for tachycardia, blood pressure, anxiolysis.             -- Continue PRNs diphenhydramine 50 mg / lorazepam 2 mg / thorazine 25 mg injection / 50 mg oral tablet  Today on morning interview, patient reports feeling "good." Sleep is good. Denies medication side effects. He reports a little bit of chest-tightness similar to previous asthma outbreaks. He does not have a rapid heartbeat or other major concerns.  Patient received a PRN for agitation approximately 3.5 hours before the meeting this morning. He was calm, agreeable, and pleasant. Continues to be disorganized, but the periods of coherence are longer and more sustained.  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 He had a sensation that he had "rocks falling out of my heels" last night, asked questions to clarify and he patiently said that maybe it was his shoes.  Sleep: Good Appetite:  Good Depression: Denies Anxiety: Denies Auditory Hallucinations: Denies. No signs. Visual Hallucinations: Denies. No signs. Paranoia: Denies [id number] SI: Denies Side effects from medications: does not endorse any side-effects they attribute to medications.  Total time spent with patient: 20 minutes  Past psychiatric history:  Diagnosed with schizophrenia in 2022 after previous hospitalization for psychosis. Pt has experienced past psychotic episodes in the summer months and depressive symptoms in the fall. Two total prior psychiatric hospitalizations in the past, with the most recent in 2022 for bizarre behavior and psychosis.   Past Medical History:  Past Medical History:  Diagnosis Date   Anxiety    Low testosterone in male    Paranoid schizophrenia (HCC)     History reviewed. No pertinent surgical history. Family History: History reviewed. No pertinent family history. Family Psychiatric History:  fmhx of bipolar disorder in patient's aunt well-controlled on lithium.   Social History:  Social History   Substance and Sexual Activity  Alcohol Use Not Currently     Social History   Substance and Sexual Activity  Drug Use Yes   Types: Other-see comments    Social History   Socioeconomic History   Marital status: Single    Spouse name: Not on  file   Number of children: Not on file   Years of education: Not on file   Highest education level: Not on file  Occupational History   Not on file  Tobacco Use   Smoking status: Never   Smokeless tobacco: Never  Substance and Sexual Activity   Alcohol use: Not Currently   Drug use: Yes    Types: Other-see comments   Sexual activity: Never  Other Topics  Concern   Not on file  Social History Narrative   Not on file   Social Determinants of Health   Financial Resource Strain: Not on file  Food Insecurity: Patient Declined (04/19/2023)   Hunger Vital Sign    Worried About Running Out of Food in the Last Year: Patient declined    Ran Out of Food in the Last Year: Patient declined  Transportation Needs: Patient Declined (04/19/2023)   PRAPARE - Administrator, Civil Service (Medical): Patient declined    Lack of Transportation (Non-Medical): Patient declined  Physical Activity: Not on file  Stress: Not on file  Social Connections: Not on file   Additional Social History:   Current Medications: Current Facility-Administered Medications  Medication Dose Route Frequency Provider Last Rate Last Admin   acetaminophen (TYLENOL) tablet 650 mg  650 mg Oral Q6H PRN Eligha Bridegroom, NP   650 mg at 04/26/23 0816   alum & mag hydroxide-simeth (MAALOX/MYLANTA) 200-200-20 MG/5ML suspension 30 mL  30 mL Oral Q4H PRN Eligha Bridegroom, NP   30 mL at 04/25/23 1115   chlorproMAZINE (THORAZINE) injection 25 mg  25 mg Intramuscular TID PRN Massengill, Harrold Donath, MD       chlorproMAZINE (THORAZINE) tablet 125 mg  125 mg Oral QHS Margaretmary Dys, MD   125 mg at 04/28/23 2027   chlorproMAZINE (THORAZINE) tablet 37.5 mg  37.5 mg Oral BID Margaretmary Dys, MD   37.5 mg at 04/29/23 1610   chlorproMAZINE (THORAZINE) tablet 50 mg  50 mg Oral TID PRN Phineas Inches, MD   50 mg at 04/29/23 0424   cloNIDine (CATAPRES) tablet 0.1 mg  0.1 mg Oral Q4H PRN Margaretmary Dys, MD       hydrOXYzine (ATARAX) tablet 25 mg  25 mg Oral TID PRN Phineas Inches, MD   25 mg at 04/26/23 2028   loratadine (CLARITIN) tablet 10 mg  10 mg Oral Daily Margaretmary Dys, MD   10 mg at 04/29/23 0836   LORazepam (ATIVAN) tablet 2 mg  2 mg Oral TID PRN Phineas Inches, MD   2 mg at 04/29/23 0424   Or   LORazepam  (ATIVAN) injection 2 mg  2 mg Intramuscular TID PRN Massengill, Harrold Donath, MD       magnesium hydroxide (MILK OF MAGNESIA) suspension 30 mL  30 mL Oral Daily PRN Eligha Bridegroom, NP       metFORMIN (GLUCOPHAGE-XR) 24 hr tablet 500 mg  500 mg Oral Q breakfast Margaretmary Dys, MD   500 mg at 04/29/23 9604   nicotine polacrilex (NICORETTE) gum 2 mg  2 mg Oral PRN Phineas Inches, MD   2 mg at 04/28/23 1953   propranolol ER (INDERAL LA) 24 hr capsule 120 mg  120 mg Oral Daily Margaretmary Dys, MD   120 mg at 04/29/23 5409   risperiDONE (RISPERDAL M-TABS) disintegrating tablet 4 mg  4 mg Oral BID Margaretmary Dys, MD   4 mg at 04/29/23 [phone]  traZODone (DESYREL) tablet 200 mg  200 mg Oral QHS PRN Phineas Inches, MD   200 mg at 04/28/23 2028    Lab Results:  Results for orders placed or performed during the hospital encounter of 04/19/23 (from the past 48 hour(s))  CBC with Differential/Platelet     Status: Abnormal   Collection Time: 04/28/23  6:48 AM  Result Value Ref Range   WBC 5.8 4.0 - 10.5 K/uL   RBC 5.55 4.22 - 5.81 MIL/uL   Hemoglobin 13.6 13.0 - 17.0 g/dL   HCT 95.6 21.3 - 08.6 %   MCV 77.8 (L) 80.0 - 100.0 fL   MCH 24.5 (L) 26.0 - 34.0 pg   MCHC 31.5 30.0 - 36.0 g/dL   RDW 57.8 (H) 46.9 - 62.9 %   Platelets 212 150 - 400 K/uL   nRBC 0.0 0.0 - 0.2 %   Neutrophils Relative % 48 %   Neutro Abs 2.8 1.7 - 7.7 K/uL   Lymphocytes Relative 41 %   Lymphs Abs 2.4 0.7 - 4.0 K/uL   Monocytes Relative 9 %   Monocytes Absolute 0.5 0.1 - 1.0 K/uL   Eosinophils Relative 1 %   Eosinophils Absolute 0.1 0.0 - 0.5 K/uL   Basophils Relative 1 %   Basophils Absolute 0.0 0.0 - 0.1 K/uL   Immature Granulocytes 0 %   Abs Immature Granulocytes 0.02 0.00 - 0.07 K/uL    Comment: Performed at [facility], [address], [place], [place] [address]  CK     Status: None   Collection Time: 04/28/23  6:48 AM  Result Value Ref Range    Total CK 223 49 - 397 U/L    Comment: Performed at [facility], [address] [place], [place] [address]  Comprehensive metabolic panel     Status: Abnormal   Collection Time: 04/28/23  6:48 AM  Result Value Ref Range   Sodium 137 135 - 145 mmol/L   Potassium 4.0 3.5 - 5.1 mmol/L   Chloride 107 98 - 111 mmol/L   CO2 19 (L) 22 - 32 mmol/L   Glucose, Bld 91 70 - 99 mg/dL    Comment: Glucose reference range applies only to samples taken after fasting for at least 8 hours.   BUN 10 6 - 20 mg/dL   Creatinine, Ser 1.02 0.61 - 1.24 mg/dL   Calcium 8.9 8.9 - 72.5 mg/dL   Total Protein 6.5 6.5 - 8.1 g/dL   Albumin 3.7 3.5 - 5.0 g/dL   AST 28 15 - 41 U/L   ALT 48 (H) 0 - 44 U/L   Alkaline Phosphatase 45 38 - 126 U/L   Total Bilirubin 0.7 0.3 - 1.2 mg/dL   GFR, Estimated >36 >64 mL/min    Comment: (NOTE) Calculated using the CKD-EPI Creatinine Equation (2021)    Anion gap 11 5 - 15    Comment: Performed at [facility], [address], [place], [place] [address]  Ammonia     Status: Abnormal   Collection Time: 04/28/23  6:48 AM  Result Value Ref Range   Ammonia 44 (H) 9 - 35 umol/L    Comment: Performed at [facility], [address] [place], [place] [address]     Blood Alcohol level:  Lab Results  Component Value Date   [facility] <10 04/18/2023   ETH <10 07/08/2021    Metabolic Disorder Labs: Lab Results  Component Value Date   HGBA1C 5.4  04/22/2023   MPG 108 04/22/2023   Lab Results  Component Value Date   PROLACTIN 22.3 (H) 09/11/2022   Lab Results  Component Value Date   CHOL 126 04/22/2023   TRIG 113 04/22/2023   HDL 21 (L) 04/22/2023   CHOLHDL 6.0 04/22/2023   VLDL 23 04/22/2023   LDLCALC 82 04/22/2023    Physical Findings: AIMS: Facial and Oral Movements Muscles of Facial Expression: None, normal Lips and Perioral Area: None, normal Jaw: None, normal Tongue: None, normal,Extremity Movements Upper  (arms, wrists, hands, fingers): None, normal Lower (legs, knees, ankles, toes): None, normal, Trunk Movements Neck, shoulders, hips: None, normal, Overall Severity Severity of abnormal movements (highest score from questions above): None, normal Incapacitation due to abnormal movements: None, normal Patient's awareness of abnormal movements (rate only patient's report): No Awareness, Dental Status Current problems with teeth and/or dentures?: No Does patient usually wear dentures?: No   Musculoskeletal: Strength & Muscle Tone: within normal limits Gait & Station: normal Patient leans: N/A  Psychiatric Specialty Exam:  Presentation  General Appearance:   Casual   Eye Contact:  Good  Speech:  Clear and Coherent; Normal Rate  Speech Volume:  Normal  Handedness:  Right    Mood and Affect  Mood:  Dysphoric  Affect:  Blunt; Non-Congruent   Thought Process  Thought Processes:  Disorganized  Descriptions of Associations: Tangential  Orientation: None  Thought Content: Illogical; Scattered  History of Schizophrenia/Schizoaffective disorder: Yes  Duration of Psychotic Symptoms: Greater than six months  Hallucinations: No data recorded  Ideas of Reference: None  Suicidal Thoughts: Suicidal Thoughts: No  Homicidal Thoughts: Homicidal Thoughts: No  Sensorium  Memory: Immediate Fair; Recent Fair; Remote Fair  Judgment:  Fair  Insight:  Poor   Executive Functions  Concentration:  Fair  Attention Span:  Fair  Recall:  Fair  Fund of Knowledge:  Good  Language:  Good  Psychomotor Activity  Psychomotor Activity:  Psychomotor Activity: Normal AIMS Completed?: Yes   Physical Exam: Physical Exam Vitals and nursing note reviewed. Exam conducted with a chaperone present.  Constitutional:      General: He is awake. He is not in acute distress.    Appearance: He is obese.  HENT:     Head: Normocephalic and atraumatic.  Pulmonary:      Effort: Pulmonary effort is normal. No respiratory distress.  Musculoskeletal:        General: Normal range of motion.     Cervical back: Normal range of motion.  Neurological:     Mental Status: He is alert.     Motor: Motor function is intact.     Gait: Gait is intact.  Psychiatric:        Behavior: Behavior is cooperative.     Review of Systems  Constitutional:  Negative for chills and fever.  Respiratory:  Positive for shortness of breath.   Cardiovascular:  Negative for chest pain.  Gastrointestinal:  Negative for abdominal pain, constipation, diarrhea, nausea and vomiting.  Musculoskeletal:  Negative for falls.       Pt denied pain in right foot today.  Neurological:  Negative for tremors.  Psychiatric/Behavioral:  Negative for depression and suicidal ideas. The patient is not nervous/anxious and does not have insomnia.     Blood pressure (!) 126/58, pulse 91, temperature 98.6 F (37 C), temperature source Oral, resp. rate 18, height 6\' 5"  (1.956 m), weight (!) 171 kg, SpO2 99%. Body mass index is 44.71 kg/m.   Treatment Plan  ASSESSMENT: Matthew Padilla is a 27 year-old male with past psychiatric history of schizophrenia, paranoid type and past medical history of hypogonadism. He presented to [facility] ED for tachycardia and found to have mild rhabdomyolysis. Patient was medically stabilized and admitted to inpatient [facility] under IVC for possible exacerbation of schizophrenia.    Patient accurately states the name of his outpatient psychiatrist Dr. Kathryne Sharper. He notes his last visit was several months ago although on chart review his last virtual follow up was on 7/3. At the time, patient was reportedly taking Zoloft 50 mg and Risperdal 1 mg as per Dr. Clarise Cruz note, but patient denies taking all medications for the last several months as they were not working for him. He has had 2 prior psychiatric hospitalizations in the past, with the most recent in 2022 for bizarre behavior  and psychosis.   7/25: Patient remains disorganized with scattered thought process and thought content on exam this morning. He is able to follow conversations, however his comments tend to go onto tangents and get derailed. [name] brought up the upcoming family vacation and voiced willingness to take any medications he is given, as long as he can go on the vacation. Told him that might not be possible, but definitely not possible when he has to get PRN medication for extra agitation.   Based on the timing of his PRN needs, will increase the nighttime thorazine dose to 150 mg and decrease the daytime doses to 25 mg each.  A key to his remaining well-adjusted in the outside world will be getting through to him about how psychoactive mushrooms are not regulated or tested and how much this illicit drug use puts him at risk for decompensation. We had more discussions about this today, and [name] made the connection that his overdosing mushrooms and not being on medication was what got him into the hospital. This will be difficult, as Orlan's intelligence allows him to read extensively online and rationalize his experimentation.    Diagnoses / Active Problems: -- Schizophrenia vs Schizoaffective Disorder -- Substance-Induced Psychosis   PLAN: Safety and Monitoring:             -- In-Voluntary admission to inpatient psychiatric unit for safety, stabilization and treatment             -- Daily contact with patient to assess and evaluate symptoms and progress in treatment             -- Patient's case to be discussed in multi-disciplinary team meeting             -- Observation Level : standard on acute unit             -- Vital signs:  q12 hours             -- Precautions: suicide, elopement, and assault   2. Psychiatric Diagnoses and Treatment:  -- Continue Risperdal m-tabs, 4 mg oral dissolvable tablets BID for acute psychosis -- Decrease daytime thorazine to 25 mg disintegrating tablet at 0700, 1500  (first dose scheduled for 1500 on 7/22) daily for psychosis -- Increase nighttime thorazine to 150 mg tablet at 2200 daily for psychosis             -- Discontinue Propranolol due to patient history of asthma  -- Add albuterol rescue inhaler Q6 PRN             -- Change PRNs diphenhydramine 50 mg / lorazepam 2 mg / thorazine 25  mg injection / 50 mg oral tablet              -- The risks/benefits/side-effects/alternatives to this medication were discussed in detail with the patient and time was given for questions. The patient consents to medication trial.              -- Metabolic profile and EKG monitoring obtained while on an atypical antipsychotic (Body mass index is 44.71 kg/m, Lipid Panel: HDL 21, LDL 82, triglycerides 113, total 126, HbgA1c: 5.5, QTc:435 on 7/16)              -- Encouraged patient to participate in unit milieu and in scheduled group therapies              -- Short Term Goals: Ability to demonstrate self-control will improve and Ability to identify triggers associated with substance abuse/mental health issues will improve             -- Long Term Goals: Improvement in symptoms so as ready for discharge               3. Medical Issues Being Addressed:  -- Labs reviewed, notable for recovering rhabdomyolysis, mild troponin leak, elevated AST, positive for THC.   -- Elevated blood glucose: Start metformin XR 500 mg once in the morning   4. Discharge Planning:              -- Social work and case management to assist with discharge planning and identification of hospital follow-up needs prior to discharge             -- Estimated LOS: 5-7 days             -- Discharge Concerns: Need to establish a safety plan; Medication compliance and effectiveness             -- Discharge Goals: Return home with outpatient referrals for mental health follow-up including medication management/psychotherapy  Signed: Christie Nottingham, MD [facility] Health Physician 04/29/2023 9:19 AM

## 2023-04-29 NOTE — Progress Notes (Signed)
   04/29/23 1000  Psych Admission Type (Psych Patients Only)  Admission Status Involuntary  Psychosocial Assessment  Patient Complaints None  Eye Contact Brief  Facial Expression Other (Comment)  Affect Appropriate to circumstance  Speech Tangential  Interaction Assertive  Motor Activity Slow  Appearance/Hygiene Unremarkable  Behavior Characteristics Cooperative  Mood Pleasant  Thought Process  Coherency Tangential  Content Preoccupation  Delusions None reported or observed  Perception WDL  Hallucination None reported or observed  Judgment Limited  Confusion None  Danger to Self  Current suicidal ideation? Denies  Danger to Others  Danger to Others None reported or observed

## 2023-04-29 NOTE — Progress Notes (Signed)
Patient alert and oriented x 4. Patient started getting agitated for not leaving with his dad. Patient asked his dad " what train are we taking home?" When his dad did not answer her stood up started pacing and carried his clothes to leave with his dad. Patient was redirected to his room and took his prn medications immediately his father left.

## 2023-04-29 NOTE — Progress Notes (Signed)
   04/29/23 2200  Psych Admission Type (Psych Patients Only)  Admission Status Involuntary  Psychosocial Assessment  Patient Complaints Anxiety;Agitation  Eye Contact Brief  Facial Expression Flat  Affect Irritable;Anxious  Scientist, water quality;Hyperactive  Appearance/Hygiene Unremarkable  Behavior Characteristics Cooperative  Mood Anxious  Thought Process  Coherency Tangential  Content Preoccupation  Delusions Paranoid  Perception WDL  Hallucination None reported or observed  Judgment Limited  Confusion Mild  Danger to Self  Current suicidal ideation? Denies  Danger to Others  Danger to Others None reported or observed

## 2023-04-30 ENCOUNTER — Encounter (HOSPITAL_COMMUNITY): Payer: Self-pay

## 2023-04-30 DIAGNOSIS — F203 Undifferentiated schizophrenia: Secondary | ICD-10-CM | POA: Diagnosis not present

## 2023-04-30 MED ORDER — CHLORPROMAZINE HCL 25 MG PO TABS
37.5000 mg | ORAL_TABLET | Freq: Two times a day (BID) | ORAL | Status: DC
  Administered 2023-05-01: 37.5 mg via ORAL
  Filled 2023-04-30 (×3): qty 2

## 2023-04-30 NOTE — BH IP Treatment Plan (Signed)
Interdisciplinary Treatment and Diagnostic Plan Update  04/30/2023 Time of Session: 2:00pm Matthew Padilla MRN: [id number]  Principal Diagnosis: Schizophrenia [facility])  Secondary Diagnoses: Principal Problem:   Schizophrenia (HCC)   Current Medications:  Current Facility-Administered Medications  Medication Dose Route Frequency Provider Last Rate Last Admin   acetaminophen (TYLENOL) tablet 650 mg  650 mg Oral Q6H PRN Eligha Bridegroom, NP   650 mg at 04/26/23 0816   albuterol (VENTOLIN HFA) 108 (90 Base) MCG/ACT inhaler 2 puff  2 puff Inhalation Q6H PRN Margaretmary Dys, MD       alum & mag hydroxide-simeth (MAALOX/MYLANTA) 200-200-20 MG/5ML suspension 30 mL  30 mL Oral Q4H PRN Eligha Bridegroom, NP   30 mL at 04/25/23 1115   chlorproMAZINE (THORAZINE) injection 25 mg  25 mg Intramuscular TID PRN Massengill, Harrold Donath, MD       chlorproMAZINE (THORAZINE) tablet 150 mg  150 mg Oral QHS Margaretmary Dys, MD   150 mg at 04/29/23 2147   chlorproMAZINE (THORAZINE) tablet 25 mg  25 mg Oral BID Margaretmary Dys, MD   25 mg at 04/30/23 0741   chlorproMAZINE (THORAZINE) tablet 50 mg  50 mg Oral TID PRN Phineas Inches, MD   50 mg at 04/30/23 1331   cloNIDine (CATAPRES) tablet 0.1 mg  0.1 mg Oral Q4H PRN Margaretmary Dys, MD       hydrOXYzine (ATARAX) tablet 25 mg  25 mg Oral TID PRN Phineas Inches, MD   25 mg at 04/30/23 1331   loratadine (CLARITIN) tablet 10 mg  10 mg Oral Daily Margaretmary Dys, MD   10 mg at 04/30/23 0865   LORazepam (ATIVAN) tablet 2 mg  2 mg Oral TID PRN Phineas Inches, MD   2 mg at 04/30/23 1330   Or   LORazepam (ATIVAN) injection 2 mg  2 mg Intramuscular TID PRN Massengill, Harrold Donath, MD       magnesium hydroxide (MILK OF MAGNESIA) suspension 30 mL  30 mL Oral Daily PRN Eligha Bridegroom, NP       metFORMIN (GLUCOPHAGE-XR) 24 hr tablet 500 mg  500 mg Oral Q breakfast Margaretmary Dys,  MD   500 mg at 04/30/23 7846   nicotine polacrilex (NICORETTE) gum 2 mg  2 mg Oral PRN Phineas Inches, MD   2 mg at 04/28/23 1953   risperiDONE (RISPERDAL M-TABS) disintegrating tablet 4 mg  4 mg Oral BID Margaretmary Dys, MD   4 mg at 04/30/23 9629   traZODone (DESYREL) tablet 200 mg  200 mg Oral QHS PRN Phineas Inches, MD   200 mg at 04/29/23 2147   PTA Medications: Medications Prior to Admission  Medication Sig Dispense Refill Last Dose   acetaminophen (TYLENOL) 500 MG tablet Take 500 mg by mouth every 6 (six) hours as needed for moderate pain.      clomiPHENE (CLOMID) 50 MG tablet Take 1 tablet (50 mg total) by mouth daily. 30 tablet 6    ibuprofen (ADVIL) 200 MG tablet Take 200-400 mg by mouth every 6 (six) hours as needed for moderate pain.      risperiDONE (RISPERDAL) 1 MG tablet TAKE 1 TABLET(1 MG) BY MOUTH AT BEDTIME 30 tablet 2    sertraline (ZOLOFT) 50 MG tablet Take 0.5-1 tablets (25-50 mg total) by mouth daily. (Patient not taking: Reported on 04/18/2023) 30 tablet 2     Patient Stressors: Health problems   Medication change or noncompliance   Substance abuse  Patient Strengths: Supportive family/friends   Treatment Modalities: Medication Management, Group therapy, Case management,  1 to 1 session with clinician, Psychoeducation, Recreational therapy.   Physician Treatment Plan for Primary Diagnosis: Schizophrenia (HCC) Long Term Goal(s): Improvement in symptoms so as ready for discharge   Short Term Goals: Ability to demonstrate self-control will improve Ability to identify triggers associated with substance abuse/mental health issues will improve  Medication Management: Evaluate patient's response, side effects, and tolerance of medication regimen.  Therapeutic Interventions: 1 to 1 sessions, Unit Group sessions and Medication administration.  Evaluation of Outcomes: Progressing  Physician Treatment Plan for Secondary Diagnosis: Principal  Problem:   Schizophrenia (HCC)  Long Term Goal(s): Improvement in symptoms so as ready for discharge   Short Term Goals: Ability to demonstrate self-control will improve Ability to identify triggers associated with substance abuse/mental health issues will improve     Medication Management: Evaluate patient's response, side effects, and tolerance of medication regimen.  Therapeutic Interventions: 1 to 1 sessions, Unit Group sessions and Medication administration.  Evaluation of Outcomes: Progressing   RN Treatment Plan for Primary Diagnosis: Schizophrenia (HCC) Long Term Goal(s): Knowledge of disease and therapeutic regimen to maintain health will improve  Short Term Goals: Ability to remain free from injury will improve, Ability to verbalize frustration and anger appropriately will improve, Ability to demonstrate self-control, Ability to participate in decision making will improve, Ability to verbalize feelings will improve, Ability to disclose and discuss suicidal ideas, Ability to identify and develop effective coping behaviors will improve, and Compliance with prescribed medications will improve  Medication Management: RN will administer medications as ordered by provider, will assess and evaluate patient's response and provide education to patient for prescribed medication. RN will report any adverse and/or side effects to prescribing provider.  Therapeutic Interventions: 1 on 1 counseling sessions, Psychoeducation, Medication administration, Evaluate responses to treatment, Monitor vital signs and CBGs as ordered, Perform/monitor CIWA, COWS, AIMS and Fall Risk screenings as ordered, Perform wound care treatments as ordered.  Evaluation of Outcomes: Progressing   LCSW Treatment Plan for Primary Diagnosis: Schizophrenia (HCC) Long Term Goal(s): Safe transition to appropriate next level of care at discharge, Engage patient in therapeutic group addressing interpersonal  concerns.  Short Term Goals: Engage patient in aftercare planning with referrals and resources, Increase social support, Increase ability to appropriately verbalize feelings, Increase emotional regulation, Facilitate acceptance of mental health diagnosis and concerns, Facilitate patient progression through stages of change regarding substance use diagnoses and concerns, Identify triggers associated with mental health/substance abuse issues, and Increase skills for wellness and recovery  Therapeutic Interventions: Assess for all discharge needs, 1 to 1 time with Social worker, Explore available resources and support systems, Assess for adequacy in community support network, Educate family and significant other(s) on suicide prevention, Complete Psychosocial Assessment, Interpersonal group therapy.  Evaluation of Outcomes: Progressing   Progress in Treatment: Attending groups: Yes. Participating in groups: Yes. Taking medication as prescribed: Yes. Toleration medication: Yes. Family/Significant other contact made: Yes, individual(s) contacted:  Yes mother  Patient understands diagnosis: No. Discussing patient identified problems/goals with staff: Yes. Medical problems stabilized or resolved: Yes. Denies suicidal/homicidal ideation: Yes. Issues/concerns per patient self-inventory: No. Other: none reported  New problem(s) identified: No, Describe:  none reported  New Short Term/Long Term Goal(s):   medication stabilization, elimination of SI thoughts, development of comprehensive mental wellness plan.    Patient Goals:  " I want to work on going home as soon as possible"   Discharge Plan or  Barriers:  Patient will return back with mom once he is DC   Reason for Continuation of Hospitalization: Other; describe Schizophrenia and taking schrooms   Estimated Length of Stay: 5 to 7 days  Last 3 [place] Suicide Severity Risk Score: Flowsheet Row [facility] (Current[facility] Counselor from 02/15/2023 in [facility] RISK CATEGORY No Risk No Risk Error: Question 1 not populated       Last [facility] 2/9 Scores:    01/05/2023   12:35 PM 08/13/2022    7:46 AM 09/08/2021    1:27 PM  Depression screen PHQ 2/9  Decreased Interest 0 0 1  Down, Depressed, Hopeless 0 0 0  PHQ - 2 Score 0 0 1  Altered sleeping  0   Tired, decreased energy  0   Change in appetite  0   Feeling bad or failure about yourself   1   Trouble concentrating  0   Moving slowly or fidgety/restless  0   Suicidal thoughts  0   PHQ-9 Score  1   Difficult doing work/chores  Not difficult at all     Scribe for Treatment Team: Starleen Arms, [place] 04/30/2023 2:17 PM

## 2023-04-30 NOTE — Plan of Care (Signed)
  Problem: Education: Goal: Knowledge of [place] [facility] Education information/materials will improve Outcome: Progressing Goal: Emotional status will improve Outcome: Progressing Goal: Mental status will improve Outcome: Progressing Goal: Verbalization of understanding the information provided will improve Outcome: Progressing   Problem: Activity: Goal: Interest or engagement in activities will improve Outcome: Progressing Goal: Sleeping patterns will improve Outcome: Progressing   Problem: Coping: Goal: Ability to verbalize frustrations and anger appropriately will improve Outcome: Progressing Goal: Ability to demonstrate self-control will improve Outcome: Progressing   Problem: Health Behavior/Discharge Planning: Goal: Identification of resources available to assist in meeting health care needs will improve Outcome: Progressing Goal: Compliance with treatment plan for underlying cause of condition will improve Outcome: Progressing   Problem: Physical Regulation: Goal: Ability to maintain clinical measurements within normal limits will improve Outcome: Progressing   Problem: Safety: Goal: Periods of time without injury will increase Outcome: Progressing   Problem: Education: Goal: Knowledge of [place] [facility] Education information/materials will improve Outcome: Progressing Goal: Emotional status will improve Outcome: Progressing Goal: Mental status will improve Outcome: Progressing Goal: Verbalization of understanding the information provided will improve Outcome: Progressing   Problem: Activity: Goal: Interest or engagement in activities will improve Outcome: Progressing Goal: Sleeping patterns will improve Outcome: Progressing   Problem: Coping: Goal: Ability to verbalize frustrations and anger appropriately will improve Outcome: Progressing Goal: Ability to demonstrate self-control will improve Outcome: Progressing   Problem: Health  Behavior/Discharge Planning: Goal: Identification of resources available to assist in meeting health care needs will improve Outcome: Progressing Goal: Compliance with treatment plan for underlying cause of condition will improve Outcome: Progressing   Problem: Physical Regulation: Goal: Ability to maintain clinical measurements within normal limits will improve Outcome: Progressing   Problem: Safety: Goal: Periods of time without injury will increase Outcome: Progressing   Problem: Coping: Goal: Coping ability will improve Outcome: Progressing Goal: Will verbalize feelings Outcome: Progressing   Problem: Coping: Goal: Coping ability will improve Outcome: Progressing Goal: Will verbalize feelings Outcome: Progressing   Problem: Health Behavior/Discharge Planning: Goal: Compliance with prescribed medication regimen will improve Outcome: Progressing   Problem: Nutritional: Goal: Ability to achieve adequate nutritional intake will improve Outcome: Progressing   Problem: Role Relationship: Goal: Ability to communicate needs accurately will improve Outcome: Progressing Goal: Ability to interact with others will improve Outcome: Progressing   Problem: Safety: Goal: Ability to redirect hostility and anger into socially appropriate behaviors will improve Outcome: Progressing Goal: Ability to remain free from injury will improve Outcome: Progressing   Problem: Coping: Goal: Ability to identify and develop effective coping behavior will improve Outcome: Progressing

## 2023-04-30 NOTE — Progress Notes (Signed)
Adult Psychoeducational Group Note  Date:  04/30/2023 Time:  11:06 AM  Group Topic/Focus:  Goals Group:   The focus of this group is to help patients establish daily goals to achieve during treatment and discuss how the patient can incorporate goal setting into their daily lives to aide in recovery.  Participation Level:  Active  Participation Quality:  Appropriate  Affect:  Appropriate  Cognitive:  Appropriate  Insight: Appropriate  Engagement in Group:  Engaged  Modes of Intervention:  Discussion  Additional Comments:  The patient  attended group.  Matthew Padilla 04/30/2023, 11:06 AM

## 2023-04-30 NOTE — Progress Notes (Signed)
Dar Note: Patient presents with flat affect affect and anxious mood.  Patient is disorganized, tangential in speech.  Observed pacing the hallway, giving his clothes away to peers.  Patient visible in milieu talking to himself.  Needed a lot of redirection on the unit.  Routine safety checks maintained.  Patient is safe on and off the unit.

## 2023-04-30 NOTE — Group Note (Signed)
Recreation Therapy Group Note   Group Topic:Team Building  Group Date: 04/30/2023 Start Time: 1006 End Time: 1026 Facilitators: Nain Rudd-McCall, LRT,CTRS Location: [address] [employer]   Recreation Therapy Notes  Goal Area(s) Addresses:  Patient will effectively work with peer towards shared goal.  Patient will identify skills used to make activity successful.  Patient will identify how skills used during activity can be applied to reach post d/c goals.   Group Description: [employer]. In teams of 5-6, patients were given 11 craft pipe cleaners. Using the materials provided, patients were instructed to compete again the opposing team(s) to build the tallest free-standing structure from floor level. The activity was timed; difficulty increased by Clinical research associate as Production designer, theatre/television/film continued.  Systematically resources were removed with additional directions for example, placing one arm behind their back, working in silence, and shape stipulations. LRT facilitated post-activity discussion reviewing team processes and necessary communication skills involved in completion. Patients were encouraged to reflect how the skills utilized, or not utilized, in this activity can be incorporated to positively impact support systems post discharge.   Affect/Mood: Appropriate   Participation Level: Engaged   Participation Quality: Independent   Behavior: Appropriate   Speech/Thought Process: Focused   Insight: Poor   Judgement: Poor   Modes of Intervention: STEM Activity   Patient Response to Interventions:  Engaged   Education Outcome:  Acknowledges education   Clinical Observations/Individualized Feedback: Pt worked well with peers. Pt also had to be reminded a few time of the rules to the activity. During processing, pt would ask questions that had nothing to do with the activity like "how does grass grow".     Plan: Continue to engage patient in RT group sessions  2-3x/week.   Aarilyn Dye-McCall, LRT,CTRS 04/30/2023 11:51 AM

## 2023-04-30 NOTE — Plan of Care (Signed)
  Problem: Education: Goal: Emotional status will improve Outcome: Progressing Goal: Verbalization of understanding the information provided will improve Outcome: Progressing   Problem: Activity: Goal: Interest or engagement in activities will improve Outcome: Progressing   Problem: Education: Goal: Mental status will improve Outcome: Progressing   Problem: Safety: Goal: Periods of time without injury will increase Outcome: Progressing

## 2023-04-30 NOTE — Progress Notes (Signed)
   04/30/23 2000  Psych Admission Type (Psych Patients Only)  Admission Status Involuntary  Psychosocial Assessment  Patient Complaints Anxiety;Agitation  Eye Contact Fair  Facial Expression Flat  Affect Anxious  Speech Tangential  Interaction Assertive  Motor Activity Fidgety  Appearance/Hygiene Unremarkable  Behavior Characteristics Anxious;Cooperative  Mood Anxious;Preoccupied;Labile  Aggressive Behavior  Effect No apparent injury  Thought Process  Coherency Disorganized;Tangential  Content Preoccupation  Delusions Paranoid  Perception Hallucinations  Hallucination Auditory  Judgment Limited  Confusion None  Danger to Self  Current suicidal ideation? Denies

## 2023-04-30 NOTE — Progress Notes (Addendum)
[facility] MD Progress Note  04/30/2023 2:02 PM Matthew Padilla  MRN:  [id number] Principal Problem: Schizophrenia (HCC) Diagnosis: Principal Problem:   Schizophrenia (HCC)   Subjective:   Matthew Padilla is a 27 year-old male with past psychiatric history of schizophrenia, paranoid type and past medical history of hypogonadism. He presented to [facility] ED for psychosis and tachycardia and found to have mild rhabdomyolysis. Patient was medically stabilized and admitted to inpatient [facility] under IVC for possible exacerbation of schizophrenia.   Case was discussed in the multidisciplinary team. MAR was reviewed and patient was compliant with medications.   PRNs in the last 24 hours: 07/26 1331 diphenhydramine 50 mg tablet 07/26 1331 ativan 2 mg tablet 07/26 1331 chlorpromazine 50 mg tablet 07/25 2147 trazodone 200 mg tablet  Psychiatric Team made the following recs yesterday: -- Continue Risperdal m-tabs, 4 mg oral dissolvable tablets BID for acute psychosis -- Decrease daytime thorazine to 25 mg disintegrating tablet at 0700, 1500 (first dose scheduled for 1500 on 7/22) daily for psychosis -- Increase nighttime thorazine to 150 mg tablet at 2200 daily for psychosis             -- Discontinue Propranolol due to patient history of asthma             -- Add albuterol rescue inhaler Q6 PRN             -- Change PRNs diphenhydramine 50 mg / lorazepam 2 mg / thorazine 25 mg injection / 50 mg oral tablet  Today on morning interview, patient reports feeling "okay." Patient continues to be scattered and disorganized, particularly when trying to engage in the same topic for more than a minute or two. Patient is able to joke one minute, then starts to ramble onto other unrelated topics. EG: Patient mentioned seeing a "beaver walking by the window yesterday then you think it's a platypus, but it's actually a bird shooting by the window super fast." He asked about when he could go home, and mentioned doing  everything he is supposed to. Told him he's making progress.  A few hours after the interview, he required PRN agitation medications again when the dosage on his daytime Thorazine was dropped.    Sleep: Good Appetite:  Good Depression: Denies Anxiety: Denies Auditory Hallucinations: Denies. No signs. Visual Hallucinations: Denies, but might be seeing things. He mentioned seeing a beaver or a platypus or a hawk yesterday. Possible that he saw two of those things, but the third would be far more unlikely. Paranoia: Denies [id number] SI: Denies Side effects from medications: does not endorse any side-effects they attribute to medications.  Total time spent with patient: 20 minutes  Past psychiatric history:  Diagnosed with schizophrenia in 2022 after previous hospitalization for psychosis. Pt has experienced past psychotic episodes in the summer months and depressive symptoms in the fall. Two total prior psychiatric hospitalizations in the past, with the most recent in 2022 for bizarre behavior and psychosis.   Past Medical History:  Past Medical History:  Diagnosis Date   Anxiety    Low testosterone in male    Paranoid schizophrenia (HCC)     History reviewed. No pertinent surgical history. Family History: History reviewed. No pertinent family history. Family Psychiatric History:  fmhx of bipolar disorder in patient's aunt well-controlled on lithium.   Social History:  Social History   Substance and Sexual Activity  Alcohol Use Not Currently     Social History   Substance and Sexual Activity  Drug Use Yes   Types: Other-see comments    Social History   Socioeconomic History   Marital status: Single    Spouse name: Not on file   Number of children: Not on file   Years of education: Not on file   Highest education level: Not on file  Occupational History   Not on file  Tobacco Use   Smoking status: Never   Smokeless tobacco: Never  Substance and Sexual Activity    Alcohol use: Not Currently   Drug use: Yes    Types: Other-see comments   Sexual activity: Never  Other Topics Concern   Not on file  Social History Narrative   Not on file   Social Determinants of Health   Financial Resource Strain: Not on file  Food Insecurity: Patient Declined (04/19/2023)   Hunger Vital Sign    Worried About Running Out of Food in the Last Year: Patient declined    Ran Out of Food in the Last Year: Patient declined  Transportation Needs: Patient Declined (04/19/2023)   PRAPARE - Administrator, Civil Service (Medical): Patient declined    Lack of Transportation (Non-Medical): Patient declined  Physical Activity: Not on file  Stress: Not on file  Social Connections: Not on file   Additional Social History: Collateral: Spoke with Pt's mother at 1350 for 15 minutes. She said [name] has been calling once or twice a day and has been very demanding of going home and that he is apparently still very far from his pre-July baseline. She said that he was able to have normal conversations and work 30 hours a week at [employer] prior to the last few weeks. Apparently his outpatient regimen prior to hospitalization was 1 mg of risperidone daily, which was enough to keep him functional and conversational with others.  He is apparently missed at work and they keep hoping for his return.   Current Medications: Current Facility-Administered Medications  Medication Dose Route Frequency Provider Last Rate Last Admin   acetaminophen (TYLENOL) tablet 650 mg  650 mg Oral Q6H PRN Eligha Bridegroom, NP   650 mg at 04/26/23 0816   albuterol (VENTOLIN HFA) 108 (90 Base) MCG/ACT inhaler 2 puff  2 puff Inhalation Q6H PRN Margaretmary Dys, MD       alum & mag hydroxide-simeth (MAALOX/MYLANTA) 200-200-20 MG/5ML suspension 30 mL  30 mL Oral Q4H PRN Eligha Bridegroom, NP   30 mL at 04/25/23 1115   chlorproMAZINE (THORAZINE) injection 25 mg  25 mg Intramuscular TID PRN Massengill,  Harrold Donath, MD       chlorproMAZINE (THORAZINE) tablet 150 mg  150 mg Oral QHS Margaretmary Dys, MD   150 mg at 04/29/23 2147   chlorproMAZINE (THORAZINE) tablet 25 mg  25 mg Oral BID Margaretmary Dys, MD   25 mg at 04/30/23 0741   chlorproMAZINE (THORAZINE) tablet 50 mg  50 mg Oral TID PRN Phineas Inches, MD   50 mg at 04/30/23 1331   cloNIDine (CATAPRES) tablet 0.1 mg  0.1 mg Oral Q4H PRN Margaretmary Dys, MD       hydrOXYzine (ATARAX) tablet 25 mg  25 mg Oral TID PRN Phineas Inches, MD   25 mg at 04/30/23 1331   loratadine (CLARITIN) tablet 10 mg  10 mg Oral Daily Margaretmary Dys, MD   10 mg at 04/30/23 0742   LORazepam (ATIVAN) tablet 2 mg  2 mg Oral TID PRN Phineas Inches, MD  2 mg at 04/30/23 1330   Or   LORazepam (ATIVAN) injection 2 mg  2 mg Intramuscular TID PRN Massengill, Harrold Donath, MD       magnesium hydroxide (MILK OF MAGNESIA) suspension 30 mL  30 mL Oral Daily PRN Eligha Bridegroom, NP       metFORMIN (GLUCOPHAGE-XR) 24 hr tablet 500 mg  500 mg Oral Q breakfast Margaretmary Dys, MD   500 mg at 04/30/23 1610   nicotine polacrilex (NICORETTE) gum 2 mg  2 mg Oral PRN Phineas Inches, MD   2 mg at 04/28/23 1953   risperiDONE (RISPERDAL M-TABS) disintegrating tablet 4 mg  4 mg Oral BID Margaretmary Dys, MD   4 mg at 04/30/23 9604   traZODone (DESYREL) tablet 200 mg  200 mg Oral QHS PRN Phineas Inches, MD   200 mg at 04/29/23 2147    Lab Results:  No results found for this or any previous visit (from the past 48 hour(s)).    Blood Alcohol level:  Lab Results  Component Value Date   ETH <10 04/18/2023   ETH <10 07/08/2021    Metabolic Disorder Labs: Lab Results  Component Value Date   HGBA1C 5.4 04/22/2023   MPG 108 04/22/2023   Lab Results  Component Value Date   PROLACTIN 22.3 (H) 09/11/2022   Lab Results  Component Value Date   CHOL 126 04/22/2023   TRIG 113  04/22/2023   HDL 21 (L) 04/22/2023   CHOLHDL 6.0 04/22/2023   VLDL 23 04/22/2023   LDLCALC 82 04/22/2023    Physical Findings: AIMS: Facial and Oral Movements Muscles of Facial Expression: None, normal Lips and Perioral Area: None, normal Jaw: None, normal Tongue: None, normal,Extremity Movements Upper (arms, wrists, hands, fingers): None, normal Lower (legs, knees, ankles, toes): None, normal, Trunk Movements Neck, shoulders, hips: None, normal, Overall Severity Severity of abnormal movements (highest score from questions above): None, normal Incapacitation due to abnormal movements: None, normal Patient's awareness of abnormal movements (rate only patient's report): No Awareness, Dental Status Current problems with teeth and/or dentures?: No Does patient usually wear dentures?: No   Musculoskeletal: Strength & Muscle Tone: within normal limits Gait & Station: normal Patient leans: N/A  Psychiatric Specialty Exam:  Presentation  General Appearance:   Casual   Eye Contact:  Good  Speech:  Clear and Coherent; Normal Rate  Speech Volume:  Normal  Handedness:  Right    Mood and Affect  Mood:  Euthymic  Affect:  Congruent; Appropriate   Thought Process  Thought Processes:  Irrevelant; Disorganized  Descriptions of Associations: Loose  Orientation: Partial  Thought Content: Perseveration  History of Schizophrenia/Schizoaffective disorder: Yes  Duration of Psychotic Symptoms: Greater than six months  Hallucinations: Hallucinations: Tactile (sensation of rocks falling from heels.)   Ideas of Reference: None  Suicidal Thoughts: Suicidal Thoughts: No  Homicidal Thoughts: Homicidal Thoughts: No  Sensorium  Memory: Remote Good; Immediate Fair; Recent Fair  Judgment:  Poor (recent additional prns)  Insight:  Fair   Art therapist  Concentration:  Fair  Attention Span:  Fair  Recall:  Fair  Fund of Knowledge:   Good  Language:  Good  Psychomotor Activity  Psychomotor Activity:  Psychomotor Activity: Normal   Physical Exam: Physical Exam Vitals and nursing note reviewed.  Constitutional:      General: He is awake. He is not in acute distress.    Appearance: He is obese.  HENT:     Head: Normocephalic and  atraumatic.  Pulmonary:     Effort: Pulmonary effort is normal. No respiratory distress.  Musculoskeletal:        General: Normal range of motion.     Cervical back: Normal range of motion.  Neurological:     Mental Status: He is alert.     Motor: Motor function is intact.     Gait: Gait is intact.  Psychiatric:        Behavior: Behavior is cooperative.     Review of Systems  Constitutional:  Negative for chills and fever.  Cardiovascular:  Negative for chest pain.  Gastrointestinal:  Negative for abdominal pain, constipation, diarrhea, nausea and vomiting.  Musculoskeletal:  Negative for falls.       Pt denied pain in right foot today.  Neurological:  Negative for tremors.  Psychiatric/Behavioral:  Negative for depression and suicidal ideas. The patient is not nervous/anxious and does not have insomnia.     Blood pressure 103/68, pulse 93, temperature (!) 97.4 F (36.3 C), temperature source Oral, resp. rate 18, height 6\' 5"  (1.956 m), weight (!) 171 kg, SpO2 99%. Body mass index is 44.71 kg/m.   Treatment Plan  ASSESSMENT: Matthew Padilla is a 27 year-old male with past psychiatric history of schizophrenia, paranoid type and past medical history of hypogonadism. He presented to [facility] ED for tachycardia and found to have mild rhabdomyolysis. Patient was medically stabilized and admitted to inpatient [facility] under IVC for possible exacerbation of schizophrenia.    Patient accurately states the name of his outpatient psychiatrist Dr. Kathryne Sharper. He notes his last visit was several months ago although on chart review his last virtual follow up was on 7/3. At the time,  patient was reportedly taking Zoloft 50 mg and Risperdal 1 mg as per Dr. Clarise Cruz note, but patient denies taking all medications for the last several months as they were not working for him. He has had 2 prior psychiatric hospitalizations in the past, with the most recent in 2022 for bizarre behavior and psychosis.   7/26: Patient remains disorganized with scattered thought process and thought content on exam this morning. He showed some signs of improvement relative to yesterday's exam, but continues to have difficulty maintaining conversation threads for more than a few minutes at a time. His reasoning and self control continue to fluctuate.   Yesterday's reduced daytime dose of thorazine at 25 mg appears to be insufficient. Was given PRN agitation meds including 50 mg thorazine in the afternoon. Will change his daytime dosage back to 37.5 for both doses tomorrow. Keep the nighttime dose the same. Given how responsive his behavior has been to the Thorazine, might be worth testing whether the risperidone is having much of an effect at all.   A key to his remaining well-adjusted in the outside world will be getting through to him about how psychoactive mushrooms are not regulated or tested and how much this illicit drug use puts him at risk for decompensation. We had more discussions about this today, and [name] made the connection that his overdosing mushrooms and not being on medication was what got him into the hospital. This will be difficult, as Demetria's intelligence allows him to read extensively online and rationalize his experimentation.    Diagnoses / Active Problems: -- Schizophrenia vs Schizoaffective Disorder -- Substance-Induced Psychosis   PLAN: Safety and Monitoring:             -- In-Voluntary admission to inpatient psychiatric unit for safety, stabilization and treatment             --  Daily contact with patient to assess and evaluate symptoms and progress in treatment             --  Patient's case to be discussed in multi-disciplinary team meeting             -- Observation Level : standard on acute unit             -- Vital signs:  q12 hours             -- Precautions: suicide, elopement, and assault   2. Psychiatric Diagnoses and Treatment:  -- Continue Risperdal m-tabs, 4 mg oral dissolvable tablets BID for acute psychosis -- Increase daytime thorazine to back to 37.5 disintegrating tablet at 0700, 1500 (first dose scheduled for 0700 on 7/27) daily for psychosis -- Continue nighttime thorazine at 150 mg tablet at 2200 daily for psychosis             -- Discontinue Propranolol due to patient history of asthma  -- Add albuterol rescue inhaler Q6 PRN             -- Change PRNs diphenhydramine 50 mg / lorazepam 2 mg / thorazine 25 mg injection / 50 mg oral tablet              -- The risks/benefits/side-effects/alternatives to this medication were discussed in detail with the patient and time was given for questions. The patient consents to medication trial.              -- Metabolic profile and EKG monitoring obtained while on an atypical antipsychotic (Body mass index is 44.71 kg/m, Lipid Panel: HDL 21, LDL 82, triglycerides 113, total 126, HbgA1c: 5.5, QTc:435 on 7/16)              -- Encouraged patient to participate in unit milieu and in scheduled group therapies              -- Short Term Goals: Ability to demonstrate self-control will improve and Ability to identify triggers associated with substance abuse/mental health issues will improve             -- Long Term Goals: Improvement in symptoms so as ready for discharge               3. Medical Issues Being Addressed:  -- Labs reviewed, notable for recovering rhabdomyolysis, mild troponin leak, elevated AST, positive for THC.   -- Elevated blood glucose: Start metformin XR 500 mg once in the morning   4. Discharge Planning:              -- Social work and case management to assist with discharge planning and  identification of hospital follow-up needs prior to discharge             -- Estimated LOS: 5-7 days             -- Discharge Concerns: Need to establish a safety plan; Medication compliance and effectiveness             -- Discharge Goals: Return home with outpatient referrals for mental health follow-up including medication management/psychotherapy  Signed: Christie Nottingham, MD [facility] Health Physician 04/30/2023 2:02 PM

## 2023-05-01 MED ORDER — CHLORPROMAZINE HCL 50 MG PO TABS
50.0000 mg | ORAL_TABLET | Freq: Two times a day (BID) | ORAL | Status: DC
  Administered 2023-05-01 – 2023-05-06 (×9): 50 mg via ORAL
  Filled 2023-05-01: qty 2
  Filled 2023-05-01 (×12): qty 1

## 2023-05-01 MED ORDER — HYDROXYZINE HCL 25 MG PO TABS
25.0000 mg | ORAL_TABLET | Freq: Three times a day (TID) | ORAL | Status: DC
  Administered 2023-05-01 – 2023-05-06 (×16): 25 mg via ORAL
  Filled 2023-05-01 (×21): qty 1

## 2023-05-01 NOTE — Group Note (Signed)
Date:  05/01/2023 Time:  1:20 PM  Group Topic/Focus:  Goals Group:   The focus of this group is to help patients establish daily goals to achieve during treatment and discuss how the patient can incorporate goal setting into their daily lives to aide in recovery.    Participation Level:  Active  Participation Quality:  Appropriate  Affect:  Appropriate  Cognitive:  Appropriate  Insight: Appropriate  Engagement in Group:  Engaged  Modes of Intervention:  Discussion    Matthew Padilla 05/01/2023, 1:20 PM

## 2023-05-01 NOTE — Group Note (Signed)
Date:  05/01/2023 Time:  1:33 PM  Group Topic/Focus:  Healthy Communication:   The focus of this group is to discuss communication, barriers to communication, as well as healthy ways to communicate with others.    Participation Level:  Active  Participation Quality:  Appropriate  Affect:  Appropriate  Cognitive:  Appropriate  Insight: Appropriate  Engagement in Group:  Engaged  Modes of Intervention:  Education  Additional Comments:    Donell Beers 05/01/2023, 1:33 PM

## 2023-05-01 NOTE — Group Note (Signed)
Date:  05/01/2023 Time:  8:23 PM  Group Topic/Focus:  Wrap-Up Group:   The focus of this group is to help patients review their daily goal of treatment and discuss progress on daily workbooks.    Participation Level:  Active  Participation Quality:  Appropriate  Affect:  Appropriate  Cognitive:  Appropriate  Insight: Appropriate  Engagement in Group:  Engaged  Modes of Intervention:  Education and Exploration  Additional Comments:  Patient attended and participated in group tonight.  He reports that he had a good day.  What was significant for him today was that "the chicken runs at midnight",  Scot Dock 05/01/2023, 8:23 PM

## 2023-05-01 NOTE — Progress Notes (Signed)
   05/01/23 2300  Psych Admission Type (Psych Patients Only)  Admission Status Involuntary  Psychosocial Assessment  Patient Complaints Anxiety  Eye Contact Fair  Facial Expression Flat  Affect Anxious  Speech Tangential  Interaction Childlike  Motor Activity Pacing  Appearance/Hygiene Unremarkable  Behavior Characteristics Anxious  Mood Preoccupied  Thought Process  Coherency Disorganized;Tangential  Content Preoccupation  Delusions Paranoid  Perception Hallucinations  Hallucination Visual  Judgment Limited  Confusion None  Danger to Self  Current suicidal ideation? Denies  Danger to Others  Danger to Others None reported or observed

## 2023-05-01 NOTE — Progress Notes (Signed)
   05/01/23 1400  Psych Admission Type (Psych Patients Only)  Admission Status Involuntary  Psychosocial Assessment  Patient Complaints Anxiety  Eye Contact Fair  Facial Expression Flat  Affect Anxious  Speech Tangential  Interaction Assertive  Motor Activity Fidgety  Appearance/Hygiene Unremarkable  Behavior Characteristics Cooperative  Mood Anxious;Preoccupied  Thought Process  Coherency Disorganized;Tangential  Content Preoccupation  Delusions Paranoid  Perception Hallucinations  Hallucination Visual  Judgment Limited  Confusion None  Danger to Self  Current suicidal ideation? Denies  Danger to Others  Danger to Others None reported or observed

## 2023-05-01 NOTE — Progress Notes (Signed)
[facility] MD Progress Note  05/01/2023 7:48 AM Matthew Padilla  MRN:  [id number] Principal Problem: Schizophrenia (HCC) Diagnosis: Principal Problem:   Schizophrenia (HCC)   Subjective:   Matthew Padilla is a 27 year-old male with past psychiatric history of schizophrenia, paranoid type and past medical history of hypogonadism. He presented to [facility] ED for psychosis and tachycardia and found to have mild rhabdomyolysis. Patient was medically stabilized and admitted to inpatient [facility] under IVC for possible exacerbation of schizophrenia.   Case was discussed in the multidisciplinary team. MAR was reviewed and patient was compliant with medications.   PRNs in the last 24 hours: 07/26 1331 diphenhydramine 50 mg tablet 07/26 1331 ativan 2 mg tablet 07/26 1331 chlorpromazine 50 mg tablet 07/26 1950 chlorpromazine 50 mg tablet 07/26 1950 ativan 2 mg tablet 07/26 1950 hydroxyzine 25 mg tablet 07/26 2152 traZODone tablet 200 mg   Psychiatric Team made the following recs yesterday: -- Continue Risperdal m-tabs, 4 mg oral dissolvable tablets BID for acute psychosis -- Increase daytime thorazine to back to 37.5 disintegrating tablet at 0700, 1500 (first dose scheduled for 0700 on 7/27) daily for psychosis -- Continue nighttime thorazine at 150 mg tablet at 2200 daily for psychosis             -- Discontinue Propranolol due to patient history of asthma  -- Add albuterol rescue inhaler Q6 PRN             -- Change PRNs diphenhydramine 50 mg / lorazepam 2 mg / thorazine 25 mg injection / 50 mg oral tablet  Today on morning interview, patient reports feeling "okay." Patient is less disorganized than yesterday, but still has a tendency to lose his train of thought or respond bizarrely. Pt denies current AVH, paranoia.   Patient was able to describe movies that he has enjoyed lately, including  [place] the Apes, which he described as "different from the early movies of the series, and  more like a drama that just happens to feature apes."  Whether it is just a restatement of something he has heard or not, it was still the most coherent conversation he has had since admission.   Sleep: Good Appetite:  Good Depression: Denies Anxiety: Denies Auditory Hallucinations: Denies. No signs. Visual Hallucinations: Denies, no signs Paranoia: Denies GM:WNUUVO SI: Denies Side effects from medications: does not endorse any side-effects they attribute to medications.  Total time spent with patient: 20 minutes  Past psychiatric history:  Diagnosed with schizophrenia in 2022 after previous hospitalization for psychosis. Pt has experienced past psychotic episodes in the summer months and depressive symptoms in the fall. Two total prior psychiatric hospitalizations in the past, with the most recent in 2022 for bizarre behavior and psychosis.   Past Medical History:  Past Medical History:  Diagnosis Date   Anxiety    Low testosterone in male    Paranoid schizophrenia (HCC)     History reviewed. No pertinent surgical history. Family History: History reviewed. No pertinent family history. Family Psychiatric History:  fmhx of bipolar disorder in patient's aunt well-controlled on lithium.   Social History:  Social History   Substance and Sexual Activity  Alcohol Use Not Currently     Social History   Substance and Sexual Activity  Drug Use Yes   Types: Other-see comments    Social History   Socioeconomic History   Marital status: Single    Spouse name: Not on file   Number of children: Not  on file   Years of education: Not on file   Highest education level: Not on file  Occupational History   Not on file  Tobacco Use   Smoking status: Never   Smokeless tobacco: Never  Substance and Sexual Activity   Alcohol use: Not Currently   Drug use: Yes    Types: Other-see comments   Sexual activity: Never  Other Topics Concern   Not on file  Social History Narrative   Not  on file   Social Determinants of Health   Financial Resource Strain: Not on file  Food Insecurity: Patient Declined (04/19/2023)   Hunger Vital Sign    Worried About Running Out of Food in the Last Year: Patient declined    Ran Out of Food in the Last Year: Patient declined  Transportation Needs: Patient Declined (04/19/2023)   PRAPARE - Administrator, Civil Service (Medical): Patient declined    Lack of Transportation (Non-Medical): Patient declined  Physical Activity: Not on file  Stress: Not on file  Social Connections: Not on file   Additional Social History:   Current Medications: Current Facility-Administered Medications  Medication Dose Route Frequency Provider Last Rate Last Admin   acetaminophen (TYLENOL) tablet 650 mg  650 mg Oral Q6H PRN Eligha Bridegroom, NP   650 mg at 04/26/23 0816   albuterol (VENTOLIN HFA) 108 (90 Base) MCG/ACT inhaler 2 puff  2 puff Inhalation Q6H PRN Margaretmary Dys, MD       alum & mag hydroxide-simeth (MAALOX/MYLANTA) 200-200-20 MG/5ML suspension 30 mL  30 mL Oral Q4H PRN Eligha Bridegroom, NP   30 mL at 04/25/23 1115   chlorproMAZINE (THORAZINE) injection 25 mg  25 mg Intramuscular TID PRN Massengill, Harrold Donath, MD       chlorproMAZINE (THORAZINE) tablet 150 mg  150 mg Oral QHS Margaretmary Dys, MD   150 mg at 04/30/23 2152   chlorproMAZINE (THORAZINE) tablet 37.5 mg  37.5 mg Oral BID Margaretmary Dys, MD   37.5 mg at 05/01/23 1610   chlorproMAZINE (THORAZINE) tablet 50 mg  50 mg Oral TID PRN Phineas Inches, MD   50 mg at 04/30/23 1950   cloNIDine (CATAPRES) tablet 0.1 mg  0.1 mg Oral Q4H PRN Margaretmary Dys, MD       hydrOXYzine (ATARAX) tablet 25 mg  25 mg Oral TID PRN Phineas Inches, MD   25 mg at 04/30/23 1950   loratadine (CLARITIN) tablet 10 mg  10 mg Oral Daily Margaretmary Dys, MD   10 mg at 04/30/23 9604   LORazepam (ATIVAN) tablet 2 mg  2 mg Oral  TID PRN Phineas Inches, MD   2 mg at 04/30/23 1950   Or   LORazepam (ATIVAN) injection 2 mg  2 mg Intramuscular TID PRN Massengill, Harrold Donath, MD       magnesium hydroxide (MILK OF MAGNESIA) suspension 30 mL  30 mL Oral Daily PRN Eligha Bridegroom, NP       metFORMIN (GLUCOPHAGE-XR) 24 hr tablet 500 mg  500 mg Oral Q breakfast Margaretmary Dys, MD   500 mg at 04/30/23 5409   nicotine polacrilex (NICORETTE) gum 2 mg  2 mg Oral PRN Phineas Inches, MD   2 mg at 04/28/23 1953   risperiDONE (RISPERDAL M-TABS) disintegrating tablet 4 mg  4 mg Oral BID Margaretmary Dys, MD   4 mg at 04/30/23 2152   traZODone (DESYREL) tablet 200 mg  200 mg  Oral QHS PRN Phineas Inches, MD   200 mg at 04/30/23 2152    Lab Results:  No results found for this or any previous visit (from the past 48 hour(s)).    Blood Alcohol level:  Lab Results  Component Value Date   ETH <10 04/18/2023   ETH <10 07/08/2021    Metabolic Disorder Labs: Lab Results  Component Value Date   HGBA1C 5.4 04/22/2023   MPG 108 04/22/2023   Lab Results  Component Value Date   PROLACTIN 22.3 (H) 09/11/2022   Lab Results  Component Value Date   CHOL 126 04/22/2023   TRIG 113 04/22/2023   HDL 21 (L) 04/22/2023   CHOLHDL 6.0 04/22/2023   VLDL 23 04/22/2023   LDLCALC 82 04/22/2023    Physical Findings: AIMS: Facial and Oral Movements Muscles of Facial Expression: None, normal Lips and Perioral Area: None, normal Jaw: None, normal Tongue: None, normal,Extremity Movements Upper (arms, wrists, hands, fingers): None, normal Lower (legs, knees, ankles, toes): None, normal, Trunk Movements Neck, shoulders, hips: None, normal, Overall Severity Severity of abnormal movements (highest score from questions above): None, normal Incapacitation due to abnormal movements: None, normal Patient's awareness of abnormal movements (rate only patient's report): No Awareness, Dental Status Current  problems with teeth and/or dentures?: No Does patient usually wear dentures?: No   Musculoskeletal: Strength & Muscle Tone: within normal limits Gait & Station: normal Patient leans: N/A  Psychiatric Specialty Exam:  Presentation  General Appearance:   Casual   Eye Contact:  Good  Speech:  Clear and Coherent; Normal Rate  Speech Volume:  Normal  Handedness:  Right    Mood and Affect  Mood:  Euthymic  Affect:  Congruent; Appropriate   Thought Process  Thought Processes:  Irrevelant; Disorganized  Descriptions of Associations: Loose  Orientation: Partial  Thought Content: Perseveration  History of Schizophrenia/Schizoaffective disorder: Yes  Duration of Psychotic Symptoms: Greater than six months  Hallucinations: No data recorded   Ideas of Reference: None  Suicidal Thoughts: No data recorded  Homicidal Thoughts: No data recorded  Sensorium  Memory: Remote Good; Immediate Fair; Recent Fair  Judgment:  Poor (recent additional prns)  Insight:  Fair   Art therapist  Concentration:  Fair  Attention Span:  Fair  Recall:  Fair  Fund of Knowledge:  Good  Language:  Good  Psychomotor Activity  Psychomotor Activity:  No data recorded   Physical Exam: Physical Exam Vitals and nursing note reviewed.  Constitutional:      General: He is awake. He is not in acute distress.    Appearance: He is obese.  HENT:     Head: Normocephalic and atraumatic.  Pulmonary:     Effort: Pulmonary effort is normal. No respiratory distress.  Musculoskeletal:        General: Normal range of motion.     Cervical back: Normal range of motion.  Skin:    General: Skin is warm and dry.  Neurological:     Mental Status: He is alert and oriented to person, place, and time.     Motor: Motor function is intact.     Gait: Gait is intact.  Psychiatric:        Behavior: Behavior is cooperative.     Review of Systems  Constitutional:   Negative for chills and fever.  Cardiovascular:  Negative for chest pain.  Gastrointestinal:  Negative for abdominal pain, constipation, diarrhea, nausea and vomiting.  Musculoskeletal:  Negative for falls.  Pt denied pain in right foot today.  Neurological:  Negative for tremors.  Psychiatric/Behavioral:  Negative for depression and suicidal ideas. The patient is not nervous/anxious and does not have insomnia.     Blood pressure 107/78, pulse (!) 118, temperature (!) 97.5 F (36.4 C), temperature source Oral, resp. rate 18, height 6\' 5"  (1.956 m), weight (!) 171 kg, SpO2 98%. Body mass index is 44.71 kg/m.   Treatment Plan  ASSESSMENT: Matthew Padilla is a 27 year-old male with past psychiatric history of schizophrenia, paranoid type and past medical history of hypogonadism. He presented to [facility] ED for tachycardia and found to have mild rhabdomyolysis. Patient was medically stabilized and admitted to inpatient [facility] under IVC for possible exacerbation of schizophrenia.    Patient accurately states the name of his outpatient psychiatrist Dr. Kathryne Sharper. He notes his last visit was several months ago although on chart review his last virtual follow up was on 7/3. At the time, patient was reportedly taking Zoloft 50 mg and Risperdal 1 mg as per Dr. Clarise Cruz note, but patient denies taking all medications for the last several months as they were not working for him. He has had 2 prior psychiatric hospitalizations in the past, with the most recent in 2022 for bizarre behavior and psychosis.   7/27: Patient is markedly improved relative to yesterday's exam. Talked more with patient about what happened yesterday when he had to receive PRN agitation medications. He mentioned that he was concerned someone was going to hurt one of the women on the unit. According to one of the mental health technicians, Mr Carson was bothered by the behavior of one of the other male patients on the unit towards  a male patient that has been kind to Mr Leaper in the last few days. He says he feels a "strong duty to be a protector" towards females, which is interesting given his veiled hostility towards several of them early in his hospitalization.  Yesterday's morning dose of thorazine at 37.5 mg appears to be insufficient. Was given PRN agitation meds twice yesterday including 50 mg thorazine in the afternoon and again in the evening. Will increase his daytime dosage to 50 mg for afternoon dose today. Keep the nighttime dose the same.   A key to his remaining well-adjusted in the outside world will be getting through to him about how psychoactive mushrooms are not regulated or tested and how much this illicit drug use puts him at risk for decompensation. We had more discussions about this today, and [name] made the connection that his overdosing mushrooms and not being on medication was what got him into the hospital. This will be difficult, as Siddiq's intelligence allows him to read extensively online and rationalize his experimentation.    Diagnoses / Active Problems: -- Schizophrenia vs Schizoaffective Disorder -- Substance-Induced Psychosis   PLAN: Safety and Monitoring:             -- In-Voluntary admission to inpatient psychiatric unit for safety, stabilization and treatment             -- Daily contact with patient to assess and evaluate symptoms and progress in treatment             -- Patient's case to be discussed in multi-disciplinary team meeting             -- Observation Level : standard on acute unit             -- Vital signs:  q12 hours             -- Precautions: suicide, elopement, and assault   2. Psychiatric Diagnoses and Treatment:  -- Continue Risperdal m-tabs, 4 mg oral dissolvable tablets BID for acute psychosis -- Increase daytime thorazine to back to 50 mg disintegrating tablet at 0700, 1500 (first dose scheduled for 1500 on 7/27) daily for psychosis -- Change hydroxyzine  25 mg TID PRN to scheduled TID -- Continue nighttime thorazine at 150 mg tablet at 2200 daily for psychosis  -- Add albuterol rescue inhaler Q6 PRN             -- Change PRNs diphenhydramine 50 mg / lorazepam 2 mg / thorazine 25 mg injection / 50 mg oral tablet              -- The risks/benefits/side-effects/alternatives to this medication were discussed in detail with the patient and time was given for questions. The patient consents to medication trial.              -- Metabolic profile and EKG monitoring obtained while on an atypical antipsychotic (Body mass index is 44.71 kg/m, Lipid Panel: HDL 21, LDL 82, triglycerides 113, total 126, HbgA1c: 5.5, QTc:435 on 7/16)              -- Encouraged patient to participate in unit milieu and in scheduled group therapies              -- Short Term Goals: Ability to demonstrate self-control will improve and Ability to identify triggers associated with substance abuse/mental health issues will improve             -- Long Term Goals: Improvement in symptoms so as ready for discharge               3. Medical Issues Being Addressed:  -- Labs reviewed, notable for recovering rhabdomyolysis, mild troponin leak, elevated AST, positive for THC.   -- Elevated blood glucose: Start metformin XR 500 mg once in the morning   4. Discharge Planning:              -- Social work and case management to assist with discharge planning and identification of hospital follow-up needs prior to discharge             -- Estimated LOS: 5-7 days             -- Discharge Concerns: Need to establish a safety plan; Medication compliance and effectiveness             -- Discharge Goals: Return home with outpatient referrals for mental health follow-up including medication management/psychotherapy  Signed: Christie Nottingham, MD [facility] Physician 05/01/2023 7:48 AM

## 2023-05-02 DIAGNOSIS — F200 Paranoid schizophrenia: Principal | ICD-10-CM

## 2023-05-02 NOTE — Progress Notes (Signed)
Pt reported having removed screw from the metal bar next to the bathroom. Staff checked and found out thet 3 screws are missing, one was recovered, 2 are missing. Pt reported one screw is another pt's pocket but did say who. Will continue to monitor.

## 2023-05-02 NOTE — Plan of Care (Signed)
  Problem: Education: Goal: Knowledge of [place] [facility] Education information/materials will improve Outcome: Progressing Goal: Mental status will improve Outcome: Progressing Goal: Verbalization of understanding the information provided will improve Outcome: Progressing   Problem: Activity: Goal: Interest or engagement in activities will improve Outcome: Progressing   

## 2023-05-02 NOTE — Plan of Care (Signed)
  Problem: Activity: Goal: Interest or engagement in activities will improve Outcome: Progressing   Problem: Safety: Goal: Periods of time without injury will increase Outcome: Progressing   

## 2023-05-02 NOTE — Progress Notes (Signed)
[facility] MD Progress Note  05/02/2023 1:48 PM Matthew Padilla  MRN:  [id number] Principal Problem: Schizophrenia (HCC) Diagnosis: Principal Problem:   Schizophrenia (HCC)   Subjective:   Matthew Padilla is a 27 year-old male with past psychiatric history of schizophrenia, paranoid type and past medical history of hypogonadism. He presented to [facility] ED for psychosis and tachycardia and found to have mild rhabdomyolysis. Patient was medically stabilized and admitted to inpatient [facility] under IVC for possible exacerbation of schizophrenia.   Case was discussed in the multidisciplinary team. MAR was reviewed and patient was compliant with medications.   PRNs in the last 24 hours: Acetaminophen x 1, albuterol every 6 hours, Nicorette gum x 1, trazodone at 2021, agitation protocol Thorazine+lorazepam+ at 2139   Psychiatric Team made the following recs yesterday: -- Continue Risperdal m-tabs, 4 mg oral dissolvable tablets BID for acute psychosis -- Increase (daytime) thorazine to 50 mg disintegrating tablet twice daily (0700, 1500) for psychosis -- Continue nighttime thorazine at 150 mg tablet at 2200 daily for psychosis  -- Continue albuterol rescue inhaler Q6 PRN  -- Changed hydroxyzine 25 mg as needed 3 times daily to scheduled 3 times daily  -- Started metformin 500 mg once in the morning for prediabetes             -- Change PRNs diphenhydramine 50 mg / lorazepam 2 mg / thorazine 25 mg injection / 50 mg oral tablet  Today her the patient reports that his right wrist is painful,  his left shoulder is popping, and that he has back pain.  He denies stiffness or rigidity.  He does not give a specific timeline for when these started.  He does not report any issues with these in the past.  He reports that he slept fine and is eating fine.  He reports that he pulled screws on the door and then he flushed them down the toilet.  He talks about feeling shameful for "harming his brother."  (He engages in  multiple tangential disorganized conversations).   Sleep: Good Appetite:  Good Depression: Denies Anxiety: Denies Auditory Hallucinations: Denies. No signs. Visual Hallucinations: Denies, no signs Paranoia: Denies [id number] SI: Denies Side effects from medications: does not endorse any side-effects they attribute to medications.  Total time spent with patient: 20 minutes  Past psychiatric history:  Diagnosed with schizophrenia in 2022 after previous hospitalization for psychosis. Pt has experienced past psychotic episodes in the summer months and depressive symptoms in the fall. Two total prior psychiatric hospitalizations in the past, with the most recent in 2022 for bizarre behavior and psychosis.   Past Medical History:  Past Medical History:  Diagnosis Date   Anxiety    Low testosterone in male    Paranoid schizophrenia (HCC)     History reviewed. No pertinent surgical history. Family History: History reviewed. No pertinent family history. Family Psychiatric History:  fmhx of bipolar disorder in patient's aunt well-controlled on lithium.   Social History:  Social History   Substance and Sexual Activity  Alcohol Use Not Currently     Social History   Substance and Sexual Activity  Drug Use Yes   Types: Other-see comments    Social History   Socioeconomic History   Marital status: Single    Spouse name: Not on file   Number of children: Not on file   Years of education: Not on file   Highest education level: Not on file  Occupational History   Not on file  Tobacco  Use   Smoking status: Never   Smokeless tobacco: Never  Substance and Sexual Activity   Alcohol use: Not Currently   Drug use: Yes    Types: Other-see comments   Sexual activity: Never  Other Topics Concern   Not on file  Social History Narrative   Not on file   Social Determinants of Health   Financial Resource Strain: Not on file  Food Insecurity: Patient Declined (04/19/2023)   Hunger  Vital Sign    Worried About Running Out of Food in the Last Year: Patient declined    Ran Out of Food in the Last Year: Patient declined  Transportation Needs: Patient Declined (04/19/2023)   PRAPARE - Administrator, Civil Service (Medical): Patient declined    Lack of Transportation (Non-Medical): Patient declined  Physical Activity: Not on file  Stress: Not on file  Social Connections: Not on file    Current Medications: Current Facility-Administered Medications  Medication Dose Route Frequency Provider Last Rate Last Admin   acetaminophen (TYLENOL) tablet 650 mg  650 mg Oral Q6H PRN Eligha Bridegroom, NP   650 mg at 05/01/23 1619   albuterol (VENTOLIN HFA) 108 (90 Base) MCG/ACT inhaler 2 puff  2 puff Inhalation Q6H PRN Margaretmary Dys, MD   2 puff at 05/02/23 1310   alum & mag hydroxide-simeth (MAALOX/MYLANTA) 200-200-20 MG/5ML suspension 30 mL  30 mL Oral Q4H PRN Eligha Bridegroom, NP   30 mL at 04/25/23 1115   chlorproMAZINE (THORAZINE) injection 25 mg  25 mg Intramuscular TID PRN Massengill, Harrold Donath, MD       chlorproMAZINE (THORAZINE) tablet 150 mg  150 mg Oral QHS Margaretmary Dys, MD   150 mg at 05/01/23 2022   chlorproMAZINE (THORAZINE) tablet 50 mg  50 mg Oral TID PRN Phineas Inches, MD   50 mg at 05/01/23 2139   chlorproMAZINE (THORAZINE) tablet 50 mg  50 mg Oral BID Margaretmary Dys, MD   50 mg at 05/02/23 1191   cloNIDine (CATAPRES) tablet 0.1 mg  0.1 mg Oral Q4H PRN Margaretmary Dys, MD       hydrOXYzine (ATARAX) tablet 25 mg  25 mg Oral TID Margaretmary Dys, MD   25 mg at 05/02/23 1130   loratadine (CLARITIN) tablet 10 mg  10 mg Oral Daily Margaretmary Dys, MD   10 mg at 05/02/23 0755   LORazepam (ATIVAN) tablet 2 mg  2 mg Oral TID PRN Phineas Inches, MD   2 mg at 05/01/23 2139   Or   LORazepam (ATIVAN) injection 2 mg  2 mg Intramuscular TID PRN Massengill, Harrold Donath, MD        magnesium hydroxide (MILK OF MAGNESIA) suspension 30 mL  30 mL Oral Daily PRN Eligha Bridegroom, NP       metFORMIN (GLUCOPHAGE-XR) 24 hr tablet 500 mg  500 mg Oral Q breakfast Margaretmary Dys, MD   500 mg at 05/02/23 4782   nicotine polacrilex (NICORETTE) gum 2 mg  2 mg Oral PRN Phineas Inches, MD   2 mg at 05/01/23 1620   risperiDONE (RISPERDAL M-TABS) disintegrating tablet 4 mg  4 mg Oral BID Margaretmary Dys, MD   4 mg at 05/02/23 0754   traZODone (DESYREL) tablet 200 mg  200 mg Oral QHS PRN Phineas Inches, MD   200 mg at 05/01/23 2021    Lab Results:  No results found for this or any previous visit (  from the past 48 hour(s)).    Blood Alcohol level:  Lab Results  Component Value Date   ETH <10 04/18/2023   ETH <10 07/08/2021    Metabolic Disorder Labs: Lab Results  Component Value Date   HGBA1C 5.4 04/22/2023   MPG 108 04/22/2023   Lab Results  Component Value Date   PROLACTIN 22.3 (H) 09/11/2022   Lab Results  Component Value Date   CHOL 126 04/22/2023   TRIG 113 04/22/2023   HDL 21 (L) 04/22/2023   CHOLHDL 6.0 04/22/2023   VLDL 23 04/22/2023   LDLCALC 82 04/22/2023    Physical Findings: AIMS: Facial and Oral Movements Muscles of Facial Expression: None, normal Lips and Perioral Area: None, normal Jaw: None, normal Tongue: None, normal,Extremity Movements Upper (arms, wrists, hands, fingers): None, normal Lower (legs, knees, ankles, toes): None, normal, Trunk Movements Neck, shoulders, hips: None, normal, Overall Severity Severity of abnormal movements (highest score from questions above): None, normal Incapacitation due to abnormal movements: None, normal Patient's awareness of abnormal movements (rate only patient's report): No Awareness, Dental Status Current problems with teeth and/or dentures?: No Does patient usually wear dentures?: No   Musculoskeletal: Strength & Muscle Tone: within normal limits Gait &  Station: normal Patient leans: N/A  Psychiatric Specialty Exam:  Presentation  General Appearance:   Appropriate for Environment; Other (comment) (Behavior is disorganized.  He is pulling out the air filters which asked him to return and he did.  When walking by the room to see another patient I saw him laying on the floor for no reason.)   Eye Contact:  Good  Speech:  Normal Rate  Speech Volume:  Normal  Handedness:  Right    Mood and Affect  Mood:  Euthymic  Affect:  Appropriate   Thought Process  Thought Processes:  Disorganized  Descriptions of Associations: Loose  Orientation: Full (Time, Place and Person)  Thought Content: Illogical; Tangential  History of Schizophrenia/Schizoaffective disorder: Yes  Duration of Psychotic Symptoms: Greater than six months  Hallucinations: Hallucinations: None Description of Auditory Hallucinations: Denies    Ideas of Reference: None  Suicidal Thoughts: Suicidal Thoughts: No   Homicidal Thoughts: Homicidal Thoughts: No   Sensorium  Memory: Immediate Fair; Recent Fair; Remote Fair  Judgment:  Poor  Insight:  Poor   Executive Functions  Concentration:  Fair  Attention Span:  Fair  Recall:  Fair  Fund of Knowledge:  Good  Language:  Good  Psychomotor Activity  Psychomotor Activity:  Psychomotor Activity: Normal AIMS Completed?: Yes    Physical Exam: Physical Exam Vitals and nursing note reviewed.  HENT:     Head: Normocephalic and atraumatic.  Pulmonary:     Effort: Pulmonary effort is normal.  Musculoskeletal:     Left shoulder: Normal. No swelling, deformity or tenderness. Normal range of motion.     Right wrist: Normal. No swelling, deformity or tenderness. Normal range of motion.     Comments: Negative empty can test, negative infraspinatus test, negative belly press test continue  Neurological:     General: No focal deficit present.     Mental Status: He is alert.      Review of Systems  Constitutional:  Negative for fever.  Cardiovascular:  Negative for chest pain and palpitations.  Gastrointestinal:  Negative for constipation, diarrhea, nausea and vomiting.  Musculoskeletal:  Positive for joint pain.  Neurological:  Negative for dizziness, weakness and headaches.    Blood pressure 122/77, pulse (!) 125, temperature 98.1 F (  36.7 C), temperature source Oral, resp. rate 18, height 6\' 5"  (1.956 m), weight (!) 171 kg, SpO2 98%. Body mass index is 44.71 kg/m.   Treatment Plan  ASSESSMENT: Matthew Padilla is a 27 year-old male with past psychiatric history of schizophrenia, paranoid type and past medical history of hypogonadism. He presented to [facility] ED for tachycardia and found to have mild rhabdomyolysis. Patient was medically stabilized and admitted to inpatient [facility] under IVC for possible exacerbation of schizophrenia.    Patient accurately states the name of his outpatient psychiatrist Dr. Kathryne Sharper. He notes his last visit was several months ago although on chart review his last virtual follow up was on 7/3. At the time, patient was reportedly taking Zoloft 50 mg and Risperdal 1 mg as per Dr. Clarise Cruz note, but patient denies taking all medications for the last several months as they were not working for him. He has had 2 prior psychiatric hospitalizations in the past, with the most recent in 2022 for bizarre behavior and psychosis.   7/27: Patient is markedly improved relative to yesterday's exam. Talked more with patient about what happened yesterday when he had to receive PRN agitation medications. He mentioned that he was concerned someone was going to hurt one of the women on the unit. According to one of the mental health technicians, Matthew Padilla was bothered by the behavior of one of the other male patients on the unit towards a male patient that has been kind to Matthew Padilla in the last few days. He says he feels a "strong duty to be a  protector" towards females, which is interesting given his veiled hostility towards several of them early in his hospitalization.  Yesterday's morning dose of thorazine at 37.5 mg appears to be insufficient. Was given PRN agitation meds twice yesterday including 50 mg thorazine in the afternoon and again in the evening. Will increase his daytime dosage to 50 mg for afternoon dose today. Keep the nighttime dose the same.   A key to his remaining well-adjusted in the outside world will be getting through to him about how psychoactive mushrooms are not regulated or tested and how much this illicit drug use puts him at risk for decompensation. We had more discussions about this today, and Matthew Padilla made the connection that his overdosing mushrooms and not being on medication was what got him into the hospital. This will be difficult, as Matthew Padilla's intelligence allows him to read extensively online and rationalize his experimentation.   7/28: Patient continues to be disorganized.  Given that his Thorazine was increased yesterday, we will not make any changes today.  Patient's new MSK concerns do not appear to be related to EPS and AIMS today was 0.   Diagnoses / Active Problems: -- Schizophrenia vs Schizoaffective Disorder -- Substance-Induced Psychosis   PLAN: Safety and Monitoring:             -- In-Voluntary admission to inpatient psychiatric unit for safety, stabilization and treatment             -- Daily contact with patient to assess and evaluate symptoms and progress in treatment             -- Patient's case to be discussed in multi-disciplinary team meeting             -- Observation Level : standard on acute unit             -- Vital signs:  q12 hours             --  Precautions: suicide, elopement, and assault   2. Psychiatric Diagnoses and Treatment:  -- Continue Risperdal m-tabs, 4 mg oral dissolvable tablets BID for acute psychosis -- Continue Increase (daytime) thorazine to 50 mg  disintegrating tablet twice daily (0700, 1500) for psychosis -- Continue hydroxyzine 25 mg as needed 3 times daily to scheduled 3 times daily -- Continue nighttime thorazine at 150 mg tablet at 2200 daily for psychosis  -- Add albuterol rescue inhaler Q6 PRN             -- Change PRNs diphenhydramine 50 mg / lorazepam 2 mg / thorazine 25 mg injection / 50 mg oral tablet              -- The risks/benefits/side-effects/alternatives to this medication were discussed in detail with the patient and time was given for questions. The patient consents to medication trial.              -- Metabolic profile and EKG monitoring obtained while on an atypical antipsychotic (Body mass index is 44.71 kg/m, Lipid Panel: HDL 21, LDL 82, triglycerides 113, total 126, HbgA1c: 5.5, QTc:435 on 7/16)              -- Encouraged patient to participate in unit milieu and in scheduled group therapies              -- Short Term Goals: Ability to demonstrate self-control will improve and Ability to identify triggers associated with substance abuse/mental health issues will improve             -- Long Term Goals: Improvement in symptoms so as ready for discharge               3. Medical Issues Being Addressed:  -- Labs reviewed, notable for recovering rhabdomyolysis, mild troponin leak, elevated AST, positive for THC.   -- Elevated blood glucose: Continue metformin XR 500 mg once in the morning   4. Discharge Planning:              -- Social work and case management to assist with discharge planning and identification of hospital follow-up needs prior to discharge             -- Estimated LOS: 5 days.              -- Discharge Concerns: Need to establish a safety plan; Medication compliance and effectiveness             -- Discharge Goals: Return home with outpatient referrals for mental health follow-up including medication management/psychotherapy  Signed: Christie Nottingham, MD [facility] Physician 05/02/2023 1:48  PM

## 2023-05-02 NOTE — Progress Notes (Signed)
   05/02/23 2205  Psych Admission Type (Psych Patients Only)  Admission Status Involuntary  Psychosocial Assessment  Patient Complaints Anxiety  Eye Contact Fair  Facial Expression Flat  Affect Anxious  Speech Tangential  Interaction Childlike  Motor Activity Pacing  Appearance/Hygiene Unremarkable  Behavior Characteristics Anxious  Mood Depressed;Preoccupied  Thought Process  Coherency Disorganized;Tangential  Content Preoccupation  Delusions Paranoid  Perception Hallucinations  Hallucination Visual  Judgment Limited  Confusion None  Danger to Self  Current suicidal ideation? Denies  Danger to Others  Danger to Others None reported or observed

## 2023-05-02 NOTE — Progress Notes (Signed)
   05/02/23 1100  Psych Admission Type (Psych Patients Only)  Admission Status Involuntary  Psychosocial Assessment  Patient Complaints Anxiety  Eye Contact Fair  Facial Expression Flat  Affect Anxious  Speech Tangential  Interaction Childlike  Motor Activity Restless;Pacing  Appearance/Hygiene Unremarkable  Behavior Characteristics Anxious  Mood Preoccupied  Thought Process  Coherency Tangential  Content Preoccupation  Delusions Paranoid  Perception Hallucinations  Hallucination Visual  Judgment Limited  Confusion None  Danger to Self  Current suicidal ideation? Denies  Danger to Others  Danger to Others None reported or observed

## 2023-05-02 NOTE — Progress Notes (Signed)
Patient ID: Matthew Padilla, male   DOB: [birth year], 27 y.o.   MRN: [id number]   Pt knocking on the medication window and yelling through the door. Pt was becoming agitated. RN processed with pt and offered medication. Ativan 2 mg, PRN given.

## 2023-05-02 NOTE — Group Note (Signed)
Date:  05/02/2023 Time:  8:27 PM  Group Topic/Focus:  Wrap-Up Group:   The focus of this group is to help patients review their daily goal of treatment and discuss progress on daily workbooks.    Participation Level:  Active  Participation Quality:  Appropriate  Affect:  Appropriate  Cognitive:  Appropriate  Insight: Appropriate  Engagement in Group:  Engaged  Modes of Intervention:  Education and Exploration  Additional Comments:  Patient attended and participated in group tonight. He reports that today he learn the power of the tambarin in music group. His dad visited and he went outside with the group.  Lita Mains [facility] 05/02/2023, 8:27 PM

## 2023-05-02 NOTE — Progress Notes (Signed)
Patient reported to MHT that someone has screws in their pocket that came from his room. Patients room was searched and several screws are missing from his bathroom door frame. Mht did remove one screw that he had loosened from the frame.

## 2023-05-03 NOTE — Group Note (Signed)
LCSW Group Therapy Note   Group Date: 05/03/2023 Start Time: 1300 End Time: 1400   Type of Therapy and Topic:  Group Therapy:  Setting Goals  Participation Level:  Active    Description of Group: In this process group, patients discussed using strengths to work toward goals and address challenges.  Patients identified two positive things about themselves and one goal they were working on.  Patients were given the opportunity to share openly and support each other's plan for self-empowerment.  The group discussed the value of gratitude and were encouraged to have a daily reflection of positive characteristics or circumstances.  Patients were encouraged to identify a plan to utilize their strengths to work on current challenges and goals.   Therapeutic Goals Patient will verbalize personal strengths/positive qualities and relate how these can assist with achieving desired personal goals Patients will verbalize affirmation of peers plans for personal change and goal setting Patients will explore the value of gratitude and positive focus as related to successful achievement of goals Patients will verbalize a plan for regular reinforcement of personal positive qualities and circumstances.   Summary of Patient Progress: Patient shared goals for family, friends, and schools. Patients was given the opportunity to share openly and support other group members' plan for self-empowerment. Patient verbalized personal strength and how they relate to achieving the desired goal. Patient was disruptive at times with peers, and speaking out of turn. Patient raised his hand to speak and began reading a book in group. Patient was also trying to give his worksheet to one of his peers and his peer said , " why are you giving me that paper " . Patient remained in group the entire session.      Therapeutic Modalities Cognitive Behavioral Therapy Motivational Interviewing   Beather Arbour 05/03/2023   2:34 PM

## 2023-05-03 NOTE — Plan of Care (Signed)

## 2023-05-03 NOTE — Progress Notes (Signed)
   05/03/23 0818  Charting Type  Charting Type Shift assessment  Safety Check Verification  Has the RN verified the 15 minute safety check completion? Yes  Neurological  Neuro (WDL) X  HEENT  HEENT (WDL) WDL  Respiratory  Respiratory (WDL) WDL  Cardiac  Cardiac (WDL) WDL  Vascular  Vascular (WDL) WDL  Integumentary  Integumentary (WDL) WDL  Braden Scale (Ages 8 and up)  Sensory Perceptions 4  Moisture 4  Activity 4  Mobility 4  Nutrition 3  Friction and Shear 3  Braden Scale Score 22  Musculoskeletal  Musculoskeletal (WDL) WDL  Assistive Device None  Gastrointestinal  Gastrointestinal (WDL) WDL  GU Assessment  Genitourinary (WDL) WDL  Neurological  Level of Consciousness Alert

## 2023-05-03 NOTE — Group Note (Signed)
Date:  05/03/2023 Time:  6:08 PM  Group Topic/Focus:  Goals Group:   The focus of this group is to help patients establish daily goals to achieve during treatment and discuss how the patient can incorporate goal setting into their daily lives to aide in recovery. Orientation:   The focus of this group is to educate the patient on the purpose and policies of crisis stabilization and provide a format to answer questions about their admission.  The group details unit policies and expectations of patients while admitted.    Participation Level:  Did Not Attend  Additional Comments: Patient was encouraged to attend group multiple times.   Merrik Puebla T [name] 05/03/2023, 6:08 PM

## 2023-05-03 NOTE — Group Note (Signed)
Date:  05/03/2023 Time:  8:20 PM  Group Topic/Focus:  Wrap-Up Group:   The focus of this group is to help patients review their daily goal of treatment and discuss progress on daily workbooks.    Participation Level:  Active  Participation Quality:  Appropriate  Affect:  Appropriate  Cognitive:  Appropriate  Insight: Appropriate  Engagement in Group:  Developing/Improving  Modes of Intervention:  Discussion  Additional Comments:  Pt stated his goal for today was to focus on his treatment plan. Pt stated he accomplished his goal today. Pt stated he talked with his doctor and social worker about his care today. Pt rated his overall day a 10. Pt stated he was able to contact his father today and appreciated his mother coming for visitation tonight. Pt stated he felt better about himself today. Pt stated he was able to attend all meals. Pt stated he took all medications provided today. Pt stated he attend all groups held today. Pt stated his appetite was pretty good today. Pt rated sleep last night was pretty good. Pt stated the goal tonight was to get some rest. Pt stated he had no physical pain today. Pt deny visual hallucinations and auditory issues tonight. Pt denies thoughts of harming himself or others. Pt stated he would alert staff if anything changed.  Felipa Furnace 05/03/2023, 8:20 PM

## 2023-05-03 NOTE — Progress Notes (Signed)
   05/03/23 2000  Psych Admission Type (Psych Patients Only)  Admission Status Involuntary  Psychosocial Assessment  Patient Complaints Anxiety  Eye Contact Fair  Facial Expression Sad;Flat  Affect Anxious  Speech Tangential  Interaction Childlike  Motor Activity Pacing  Appearance/Hygiene Unremarkable  Behavior Characteristics Anxious  Mood Suspicious;Preoccupied  Aggressive Behavior  Effect No apparent injury  Thought Process  Coherency Circumstantial;Disorganized;Tangential  Content Preoccupation;Paranoia  Delusions Paranoid  Perception Hallucinations  Hallucination Visual  Judgment Limited  Confusion WDL  Danger to Self  Current suicidal ideation? Denies

## 2023-05-03 NOTE — Progress Notes (Signed)
[facility] MD Progress Note  05/03/2023 7:55 AM Matthew Padilla  MRN:  [id number] Principal Problem: Schizophrenia (HCC) Diagnosis: Principal Problem:   Schizophrenia (HCC)   Subjective:   Matthew Padilla is a 27 year-old male with past psychiatric history of schizophrenia, paranoid type and past medical history of hypogonadism. He presented to [facility] ED for psychosis and tachycardia and found to have mild rhabdomyolysis. Patient was medically stabilized and admitted to inpatient [facility] under IVC for possible exacerbation of schizophrenia.   Case was discussed in the multidisciplinary team. MAR was reviewed and patient was compliant with medications.   PRNs in the last 24 hours: 07/28 1537 ativan 2 mg 07/28 2051 trazodone 200 mg 07/29 1255 thorazine 50 mg   Psychiatric Team made the following recs yesterday: -- Continue Risperdal m-tabs, 4 mg oral dissolvable tablets BID for acute psychosis -- Continue Increase (daytime) thorazine to 50 mg disintegrating tablet twice daily (0700, 1500) for psychosis -- Continue hydroxyzine 25 mg as needed 3 times daily to scheduled 3 times daily -- Continue nighttime thorazine at 150 mg tablet at 2200 daily for psychosis  -- Add albuterol rescue inhaler Q6 PRN             -- Change PRNs diphenhydramine 50 mg / lorazepam 2 mg / thorazine 25 mg injection / 50 mg oral tablet  This morning on exam he reported good sleep good appetite that his mood was "level"and that he was "trying to keep it even".  He reported no anxiety or depression, just "frustration that I am still here".  Patient was able to engage in a couple of other topics unrelated to his mental illness and was able to be polite and respectful at all times.  Patient says that he is going to groups and he denied all auditory and visual hallucinations.  When asked directly whether he would plan to eat mushrooms once he got outside, he joked just the kind that go on pizza regularly.  Sleep: Good Appetite:   Good Depression: Denies Anxiety: Denies Auditory Hallucinations: Denies. No signs. Visual Hallucinations: Denies, no signs Paranoia: Denies NG:[id number] SI: Denies Side effects from medications: does not endorse any side-effects they attribute to medications.  Total time spent with patient: 20 minutes  Past psychiatric history:  Diagnosed with schizophrenia in 2022 after previous hospitalization for psychosis. Pt has experienced past psychotic episodes in the summer months and depressive symptoms in the fall. Two total prior psychiatric hospitalizations in the past, with the most recent in 2022 for bizarre behavior and psychosis.   Past Medical History:  Past Medical History:  Diagnosis Date   Anxiety    Low testosterone in male    Paranoid schizophrenia (HCC)     History reviewed. No pertinent surgical history. Family History: History reviewed. No pertinent family history. Family Psychiatric History:  fmhx of bipolar disorder in patient's aunt well-controlled on lithium.   Social History:  Social History   Substance and Sexual Activity  Alcohol Use Not Currently     Social History   Substance and Sexual Activity  Drug Use Yes   Types: Other-see comments    Social History   Socioeconomic History   Marital status: Single    Spouse name: Not on file   Number of children: Not on file   Years of education: Not on file   Highest education level: Not on file  Occupational History   Not on file  Tobacco Use   Smoking status: Never   Smokeless tobacco:  Never  Substance and Sexual Activity   Alcohol use: Not Currently   Drug use: Yes    Types: Other-see comments   Sexual activity: Never  Other Topics Concern   Not on file  Social History Narrative   Not on file   Social Determinants of Health   Financial Resource Strain: Not on file  Food Insecurity: Patient Declined (04/19/2023)   Hunger Vital Sign    Worried About Running Out of Food in the Last Year: Patient  declined    Ran Out of Food in the Last Year: Patient declined  Transportation Needs: Patient Declined (04/19/2023)   PRAPARE - Administrator, Civil Service (Medical): Patient declined    Lack of Transportation (Non-Medical): Patient declined  Physical Activity: Not on file  Stress: Not on file  Social Connections: Not on file    Current Medications: Current Facility-Administered Medications  Medication Dose Route Frequency Provider Last Rate Last Admin   acetaminophen (TYLENOL) tablet 650 mg  650 mg Oral Q6H PRN Eligha Bridegroom, NP   650 mg at 05/01/23 1619   albuterol (VENTOLIN HFA) 108 (90 Base) MCG/ACT inhaler 2 puff  2 puff Inhalation Q6H PRN Margaretmary Dys, MD   2 puff at 05/02/23 2022   alum & mag hydroxide-simeth (MAALOX/MYLANTA) 200-200-20 MG/5ML suspension 30 mL  30 mL Oral Q4H PRN Eligha Bridegroom, NP   30 mL at 04/25/23 1115   chlorproMAZINE (THORAZINE) injection 25 mg  25 mg Intramuscular TID PRN Massengill, Harrold Donath, MD       chlorproMAZINE (THORAZINE) tablet 150 mg  150 mg Oral QHS Margaretmary Dys, MD   150 mg at 05/02/23 2051   chlorproMAZINE (THORAZINE) tablet 50 mg  50 mg Oral TID PRN Phineas Inches, MD   50 mg at 05/01/23 2139   chlorproMAZINE (THORAZINE) tablet 50 mg  50 mg Oral BID Margaretmary Dys, MD   50 mg at 05/03/23 0615   cloNIDine (CATAPRES) tablet 0.1 mg  0.1 mg Oral Q4H PRN Margaretmary Dys, MD       hydrOXYzine (ATARAX) tablet 25 mg  25 mg Oral TID Margaretmary Dys, MD   25 mg at 05/02/23 1610   loratadine (CLARITIN) tablet 10 mg  10 mg Oral Daily Margaretmary Dys, MD   10 mg at 05/02/23 0755   LORazepam (ATIVAN) tablet 2 mg  2 mg Oral TID PRN Phineas Inches, MD   2 mg at 05/02/23 1537   Or   LORazepam (ATIVAN) injection 2 mg  2 mg Intramuscular TID PRN Massengill, Harrold Donath, MD       magnesium hydroxide (MILK OF MAGNESIA) suspension 30 mL  30 mL Oral  Daily PRN Eligha Bridegroom, NP       metFORMIN (GLUCOPHAGE-XR) 24 hr tablet 500 mg  500 mg Oral Q breakfast Margaretmary Dys, MD   500 mg at 05/02/23 4098   nicotine polacrilex (NICORETTE) gum 2 mg  2 mg Oral PRN Massengill, Harrold Donath, MD   2 mg at 05/02/23 1455   risperiDONE (RISPERDAL M-TABS) disintegrating tablet 4 mg  4 mg Oral BID Margaretmary Dys, MD   4 mg at 05/02/23 2051   traZODone (DESYREL) tablet 200 mg  200 mg Oral QHS PRN Phineas Inches, MD   200 mg at 05/02/23 2051    Lab Results:  No results found for this or any previous visit (from the past 48 hour(s)).    Blood Alcohol  level:  Lab Results  Component Value Date   ETH <10 04/18/2023   ETH <10 07/08/2021    Metabolic Disorder Labs: Lab Results  Component Value Date   HGBA1C 5.4 04/22/2023   MPG 108 04/22/2023   Lab Results  Component Value Date   PROLACTIN 22.3 (H) 09/11/2022   Lab Results  Component Value Date   CHOL 126 04/22/2023   TRIG 113 04/22/2023   HDL 21 (L) 04/22/2023   CHOLHDL 6.0 04/22/2023   VLDL 23 04/22/2023   LDLCALC 82 04/22/2023    Physical Findings: AIMS: Facial and Oral Movements Muscles of Facial Expression: None, normal Lips and Perioral Area: None, normal Jaw: None, normal Tongue: None, normal,Extremity Movements Upper (arms, wrists, hands, fingers): None, normal Lower (legs, knees, ankles, toes): None, normal, Trunk Movements Neck, shoulders, hips: None, normal, Overall Severity Severity of abnormal movements (highest score from questions above): None, normal Incapacitation due to abnormal movements: None, normal Patient's awareness of abnormal movements (rate only patient's report): No Awareness, Dental Status Current problems with teeth and/or dentures?: No Does patient usually wear dentures?: No   Musculoskeletal: Strength & Muscle Tone: within normal limits Gait & Station: normal Patient leans: N/A  Psychiatric Specialty  Exam:  Presentation  General Appearance:   Appropriate for Environment; Other (comment) (Behavior is disorganized.  He is pulling out the air filters which asked him to return and he did.  When walking by the room to see another patient I saw him laying on the floor for no reason.)   Eye Contact:  Good  Speech:  Normal Rate  Speech Volume:  Normal  Handedness:  Right    Mood and Affect  Mood:  Euthymic  Affect:  Appropriate   Thought Process  Thought Processes:  Disorganized  Descriptions of Associations: Loose  Orientation: Full (Time, Place and Person)  Thought Content: Illogical; Tangential  History of Schizophrenia/Schizoaffective disorder: Yes  Duration of Psychotic Symptoms: Greater than six months  Hallucinations: Hallucinations: None Description of Auditory Hallucinations: Denies    Ideas of Reference: None  Suicidal Thoughts: Suicidal Thoughts: No   Homicidal Thoughts: Homicidal Thoughts: No   Sensorium  Memory: Immediate Fair; Recent Fair; Remote Fair  Judgment:  Poor  Insight:  Poor   Executive Functions  Concentration:  Fair  Attention Span:  Fair  Recall:  Fair  Fund of Knowledge:  Good  Language:  Good  Psychomotor Activity  Psychomotor Activity:  Psychomotor Activity: Normal AIMS Completed?: Yes    Physical Exam: Physical Exam Vitals and nursing note reviewed.  HENT:     Head: Normocephalic and atraumatic.  Pulmonary:     Effort: Pulmonary effort is normal.  Musculoskeletal:     Left shoulder: Normal. No swelling, deformity or tenderness. Normal range of motion.     Right wrist: Normal. No swelling, deformity or tenderness. Normal range of motion.     Comments: Negative empty can test, negative infraspinatus test, negative belly press test continue  Skin:    General: Skin is warm and dry.  Neurological:     General: No focal deficit present.     Mental Status: He is alert.  Psychiatric:         Behavior: Behavior normal.        Judgment: Judgment normal.     Review of Systems  Constitutional:  Negative for fever.  Cardiovascular:  Negative for chest pain and palpitations.  Gastrointestinal:  Negative for constipation, diarrhea, nausea and vomiting.  Musculoskeletal:  Positive for  joint pain.  Neurological:  Negative for dizziness, weakness and headaches.    Blood pressure (!) 106/51, pulse (!) 117, temperature 98.3 F (36.8 C), temperature source Oral, resp. rate 18, height 6\' 5"  (1.956 m), weight (!) 171 kg, SpO2 98%. Body mass index is 44.71 kg/m.   Treatment Plan  ASSESSMENT: Lb Matthew Padilla is a 27 year-old male with past psychiatric history of schizophrenia, paranoid type and past medical history of hypogonadism. He presented to [facility] ED for tachycardia and found to have mild rhabdomyolysis. Patient was medically stabilized and admitted to inpatient [facility] under IVC for possible exacerbation of schizophrenia.    Patient accurately states the name of his outpatient psychiatrist Dr. Kathryne Sharper. He notes his last visit was several months ago although on chart review his last virtual follow up was on 7/3. At the time, patient was reportedly taking Zoloft 50 mg and Risperdal 1 mg as per Dr. Clarise Cruz note, but patient denies taking all medications for the last several months as they were not working for him. He has had 2 prior psychiatric hospitalizations in the past, with the most recent in 2022 for bizarre behavior and psychosis.   7/29: Patient was less disorganized on exam than previously seen.  Patient was able to maintain a linear conversation of approximately 5 minutes in duration regarding movies and TV shows that he is familiar with.  When asked questions about his symptoms and how he was feeling from a mood standpoint he was able to consistently and clearly answer questions.  However, nursing noted continued signs of agitation yesterday including banging on the medication  window and demanding his antianxiety medication.  At this time do not wish to go up on his antipsychotic medications due to his already high doses and would prefer to see if we can find alternatives.  Patient is having longer periods of lucidity and linear thought than on previous days.  The frustrating part of this is that he still has occasional bouts of agitation that are being resolved only after as needed medications were given Would like to transition him off of Ativan as his agitation/antianxiety medication and start him on something as simple as a higher dose of hydroxyzine if we can and would ideally like to transition him into a stepdown scenario if he can continue to remain under control for 48 hours.  Patient needs new labs just to confirm that he is doing well on these large doses of antipsychotics. Ordering an EKG, CMP, CK.    Diagnoses / Active Problems: -- Schizophrenia vs Schizoaffective Disorder -- Substance-Induced Psychosis   PLAN: Safety and Monitoring:             -- In-Voluntary admission to inpatient psychiatric unit for safety, stabilization and treatment             -- Daily contact with patient to assess and evaluate symptoms and progress in treatment             -- Patient's case to be discussed in multi-disciplinary team meeting             -- Observation Level : standard on acute unit             -- Vital signs:  q12 hours             -- Precautions: suicide, elopement, and assault   2. Psychiatric Diagnoses and Treatment:  -- Continue Risperdal m-tabs, 4 mg oral dissolvable tablets BID for acute psychosis -- Continue  Increase (daytime) thorazine to 50 mg disintegrating tablet twice daily (0700, 1500) for psychosis -- Continue hydroxyzine 25 mg as needed 3 times daily to scheduled 3 times daily -- Continue nighttime thorazine at 150 mg tablet at 2200 daily for psychosis  -- Add albuterol rescue inhaler Q6 PRN             -- Change PRNs diphenhydramine 50 mg /  lorazepam 2 mg / thorazine 25 mg injection / 50 mg oral tablet              -- The risks/benefits/side-effects/alternatives to this medication were discussed in detail with the patient and time was given for questions. The patient consents to medication trial.              -- Metabolic profile and EKG monitoring obtained while on an atypical antipsychotic (Body mass index is 44.71 kg/m, Lipid Panel: HDL 21, LDL 82, triglycerides 113, total 126, HbgA1c: 5.5, QTc:435 on 7/16)              -- Encouraged patient to participate in unit milieu and in scheduled group therapies              -- Short Term Goals: Ability to demonstrate self-control will improve and Ability to identify triggers associated with substance abuse/mental health issues will improve             -- Long Term Goals: Improvement in symptoms so as ready for discharge               3. Medical Issues Being Addressed:  -- Labs reviewed, notable for recovering rhabdomyolysis, mild troponin leak, elevated AST, positive for THC.   -- Elevated blood glucose: Continue metformin XR 500 mg once in the morning   4. Discharge Planning:              -- Social work and case management to assist with discharge planning and identification of hospital follow-up needs prior to discharge             -- Estimated LOS: 5 days.              -- Discharge Concerns: Need to establish a safety plan; Medication compliance and effectiveness             -- Discharge Goals: Return home with outpatient referrals for mental health follow-up including medication management/psychotherapy  Signed: Christie Nottingham, MD [facility] 05/03/2023 7:55 AM

## 2023-05-03 NOTE — Plan of Care (Signed)
  Problem: Education: Goal: Knowledge of [place] Education information/materials will improve Outcome: Progressing   Problem: Activity: Goal: Sleeping patterns will improve Outcome: Progressing   Problem: Coping: Goal: Ability to demonstrate self-control will improve Outcome: Progressing   Problem: Safety: Goal: Periods of time without injury will increase Outcome: Progressing

## 2023-05-03 NOTE — Group Note (Signed)
Recreation Therapy Group Note   Group Topic:Stress Management  Group Date: 05/03/2023 Start Time: 1015 End Time: 1055 Facilitators: Anyelina Claycomb-McCall, LRT,CTRS Location: [address] Dayroom   Goal Area(s) Addresses:  Patient will identify positive stress management techniques. Patient will identify benefits of using stress management post d/c.   Group Description: Stress Management. LRT and patients discussed the importance of stress management.  Patients were to complete a worksheet that describing their largest source of stress in detail. Patient then needed to list two stressors they are currently experiencing and then circle the symptoms they experience in response to their stress. LRT also played music as patients completed the worksheet.   Affect/Mood: Appropriate   Participation Level: Minimal   Participation Quality: Minimal Cues   Behavior: Off-task   Speech/Thought Process: Unfocused   Insight: Poor   Judgement: Poor   Modes of Intervention: Music and Worksheet   Patient Response to Interventions:  Disengaged   Education Outcome:  In group clarification offered    Clinical Observations/Individualized Feedback: Pt was off task but didn't interfere with peers completing the assignment. Pt had to be redirected to do the assignment once. Pt was into his own thing but was respectful to peers while they worked. Pt moved around in his seat to the music as it played.        Plan: Continue to engage patient in RT group sessions 2-3x/week.   Chord Takahashi-McCall, LRT,CTRS 05/03/2023 11:35 AM

## 2023-05-04 DIAGNOSIS — F203 Undifferentiated schizophrenia: Secondary | ICD-10-CM | POA: Diagnosis not present

## 2023-05-04 MED ORDER — POLYETHYLENE GLYCOL 3350 17 G PO PACK
17.0000 g | PACK | Freq: Every day | ORAL | Status: DC
  Administered 2023-05-04: 17 g via ORAL
  Filled 2023-05-04 (×5): qty 1

## 2023-05-04 MED ORDER — GABAPENTIN 100 MG PO CAPS
200.0000 mg | ORAL_CAPSULE | Freq: Every day | ORAL | Status: DC
  Administered 2023-05-04 – 2023-05-05 (×2): 200 mg via ORAL
  Filled 2023-05-04 (×4): qty 2

## 2023-05-04 NOTE — Group Note (Signed)
Date:  05/04/2023 Time:  8:33 PM  Group Topic/Focus:  Wrap-Up Group:   The focus of this group is to help patients review their daily goal of treatment and discuss progress on daily workbooks.    Participation Level:  Active  Participation Quality:  Appropriate  Affect:  Appropriate  Cognitive:  Appropriate  Insight: Appropriate  Engagement in Group:  Engaged  Modes of Intervention:  Education and Exploration  Additional Comments:  Patient attended and participated in group tonight.  He reports that his goal today was to figure out how to plan his discharge.  He had a shave in preparation for his discharge.  that   Scot Dock 05/04/2023, 8:33 PM

## 2023-05-04 NOTE — Progress Notes (Signed)
[facility] MD Progress Note  05/04/2023 7:31 PM Matthew Padilla  MRN:  [id number] Principal Problem: Schizophrenia (HCC) Diagnosis: Principal Problem:   Schizophrenia (HCC)   Subjective:   Matthew Padilla is a 27 year-old male with past psychiatric history of schizophrenia, paranoid type and past medical history of hypogonadism. He presented to [facility] ED for psychosis and tachycardia and found to have mild rhabdomyolysis. Patient was medically stabilized and admitted to inpatient [facility] under IVC for possible exacerbation of schizophrenia.   Case was discussed in the multidisciplinary team. MAR was reviewed and patient was compliant with medications.   PRNs in the last 24 hours: 07/28 1537 ativan 2 mg 07/28 2051 trazodone 200 mg 07/29 1255 thorazine 50 mg   Psychiatric Team made the following recs yesterday: -- Continue Risperdal m-tabs, 4 mg oral dissolvable tablets BID for acute psychosis -- Continue Increase (daytime) thorazine to 50 mg disintegrating tablet twice daily (0700, 1500) for psychosis -- Continue hydroxyzine 25 mg as needed 3 times daily to scheduled 3 times daily -- Continue nighttime thorazine at 150 mg tablet at 2200 daily for psychosis  -- Add albuterol rescue inhaler Q6 PRN             -- Change PRNs diphenhydramine 50 mg / lorazepam 2 mg / thorazine 25 mg injection / 50 mg oral tablet  This morning on exam he reported a rough night of sleep due to constipation and bloating overnight. He stated that his previous bowel movement was on 7/29. Ordered him miralax to make sure that he was able to go to the bathroom comfortably. On a later visit he seemed better and verbalized he was doing "okay." In terms of his psychiatric symptoms, Matthew Padilla has shown patience and progress over the last few days. His thinking has been more linear and goal directed, and he has needed far fewer agitation protocols. His only reported physical complaint was a minor cramping in his calf, which could be  from his pacing the halls. Matthew Padilla was uncomfortable today but still polite. No signs of the aggression, paranoia, or inappropriate male-oriented attention from his initial presentation.  Sleep: Good Appetite:  Good Depression: Denies Anxiety: Denies Auditory Hallucinations: Denies. No signs. Visual Hallucinations: Denies, no signs Paranoia: Denies [id number] SI: Denies Side effects from medications: does not endorse any side-effects they attribute to medications.  Total time spent with patient: 20 minutes  Past psychiatric history:  Diagnosed with schizophrenia in 2022 after previous hospitalization for psychosis. Pt has experienced past psychotic episodes in the summer months and depressive symptoms in the fall. Two total prior psychiatric hospitalizations in the past, with the most recent in 2022 for bizarre behavior and psychosis.   Past Medical History:  Past Medical History:  Diagnosis Date   Anxiety    Low testosterone in male    Paranoid schizophrenia (HCC)     History reviewed. No pertinent surgical history. Family History: History reviewed. No pertinent family history. Family Psychiatric History:  fmhx of bipolar disorder in patient's aunt well-controlled on lithium.   Social History:  Social History   Substance and Sexual Activity  Alcohol Use Not Currently     Social History   Substance and Sexual Activity  Drug Use Yes   Types: Other-see comments    Social History   Socioeconomic History   Marital status: Single    Spouse name: Not on file   Number of children: Not on file   Years of education: Not on file   Highest education  level: Not on file  Occupational History   Not on file  Tobacco Use   Smoking status: Never   Smokeless tobacco: Never  Substance and Sexual Activity   Alcohol use: Not Currently   Drug use: Yes    Types: Other-see comments   Sexual activity: Never  Other Topics Concern   Not on file  Social History Narrative   Not on file    Social Determinants of Health   Financial Resource Strain: Not on file  Food Insecurity: Patient Declined (04/19/2023)   Hunger Vital Sign    Worried About Running Out of Food in the Last Year: Patient declined    Ran Out of Food in the Last Year: Patient declined  Transportation Needs: Patient Declined (04/19/2023)   PRAPARE - Administrator, Civil Service (Medical): Patient declined    Lack of Transportation (Non-Medical): Patient declined  Physical Activity: Not on file  Stress: Not on file  Social Connections: Not on file    Current Medications: Current Facility-Administered Medications  Medication Dose Route Frequency Provider Last Rate Last Admin   acetaminophen (TYLENOL) tablet 650 mg  650 mg Oral Q6H PRN Eligha Bridegroom, NP   650 mg at 05/03/23 1937   albuterol (VENTOLIN HFA) 108 (90 Base) MCG/ACT inhaler 2 puff  2 puff Inhalation Q6H PRN Margaretmary Dys, MD   2 puff at 05/03/23 0857   alum & mag hydroxide-simeth (MAALOX/MYLANTA) 200-200-20 MG/5ML suspension 30 mL  30 mL Oral Q4H PRN Eligha Bridegroom, NP   30 mL at 04/25/23 1115   chlorproMAZINE (THORAZINE) injection 25 mg  25 mg Intramuscular TID PRN Massengill, Harrold Donath, MD       chlorproMAZINE (THORAZINE) tablet 150 mg  150 mg Oral QHS Margaretmary Dys, MD   150 mg at 05/03/23 2047   chlorproMAZINE (THORAZINE) tablet 50 mg  50 mg Oral TID PRN Phineas Inches, MD   50 mg at 05/03/23 1255   chlorproMAZINE (THORAZINE) tablet 50 mg  50 mg Oral BID Margaretmary Dys, MD   50 mg at 05/04/23 1451   cloNIDine (CATAPRES) tablet 0.1 mg  0.1 mg Oral Q4H PRN Margaretmary Dys, MD       gabapentin (NEURONTIN) capsule 200 mg  200 mg Oral QHS Massengill, Harrold Donath, MD       hydrOXYzine (ATARAX) tablet 25 mg  25 mg Oral TID Margaretmary Dys, MD   25 mg at 05/04/23 1824   loratadine (CLARITIN) tablet 10 mg  10 mg Oral Daily Margaretmary Dys, MD    10 mg at 05/04/23 1610   LORazepam (ATIVAN) tablet 2 mg  2 mg Oral TID PRN Phineas Inches, MD   2 mg at 05/02/23 1537   Or   LORazepam (ATIVAN) injection 2 mg  2 mg Intramuscular TID PRN Massengill, Harrold Donath, MD       magnesium hydroxide (MILK OF MAGNESIA) suspension 30 mL  30 mL Oral Daily PRN Eligha Bridegroom, NP   30 mL at 05/04/23 0902   metFORMIN (GLUCOPHAGE-XR) 24 hr tablet 500 mg  500 mg Oral Q breakfast Margaretmary Dys, MD   500 mg at 05/04/23 9604   nicotine polacrilex (NICORETTE) gum 2 mg  2 mg Oral PRN Phineas Inches, MD   2 mg at 05/02/23 1455   polyethylene glycol (MIRALAX / GLYCOLAX) packet 17 g  17 g Oral Daily Margaretmary Dys, MD   17 g at 05/04/23 1012  risperiDONE (RISPERDAL M-TABS) disintegrating tablet 4 mg  4 mg Oral BID Margaretmary Dys, MD   4 mg at 05/04/23 7829   traZODone (DESYREL) tablet 200 mg  200 mg Oral QHS PRN Phineas Inches, MD   200 mg at 05/03/23 2047    Lab Results:  No results found for this or any previous visit (from the past 48 hour(s)).    Blood Alcohol level:  Lab Results  Component Value Date   ETH <10 04/18/2023   ETH <10 07/08/2021    Metabolic Disorder Labs: Lab Results  Component Value Date   HGBA1C 5.4 04/22/2023   MPG 108 04/22/2023   Lab Results  Component Value Date   PROLACTIN 22.3 (H) 09/11/2022   Lab Results  Component Value Date   CHOL 126 04/22/2023   TRIG 113 04/22/2023   HDL 21 (L) 04/22/2023   CHOLHDL 6.0 04/22/2023   VLDL 23 04/22/2023   LDLCALC 82 04/22/2023    Physical Findings: AIMS: Facial and Oral Movements Muscles of Facial Expression: None, normal Lips and Perioral Area: None, normal Jaw: None, normal Tongue: None, normal,Extremity Movements Upper (arms, wrists, hands, fingers): None, normal Lower (legs, knees, ankles, toes): None, normal, Trunk Movements Neck, shoulders, hips: None, normal, Overall Severity Severity of abnormal movements  (highest score from questions above): None, normal Incapacitation due to abnormal movements: None, normal Patient's awareness of abnormal movements (rate only patient's report): No Awareness, Dental Status Current problems with teeth and/or dentures?: No Does patient usually wear dentures?: No   Musculoskeletal: Strength & Muscle Tone: within normal limits Gait & Station: normal Patient leans: N/A  Psychiatric Specialty Exam:  Presentation  General Appearance:   Disheveled   Eye Contact:  Good  Speech:  Clear and Coherent  Speech Volume:  Normal  Handedness:  Right    Mood and Affect  Mood:  Anxious  Affect:  Congruent; Appropriate   Thought Process  Thought Processes:  Coherent; Goal Directed; Linear  Descriptions of Associations: Circumstantial  Orientation: Full (Time, Place and Person)  Thought Content: Abstract Reasoning; Logical  History of Schizophrenia/Schizoaffective disorder: Yes  Duration of Psychotic Symptoms: Greater than six months  Hallucinations: Hallucinations: None    Ideas of Reference: None  Suicidal Thoughts: Suicidal Thoughts: No Homicidal Thoughts: Homicidal Thoughts: No   Sensorium  Memory: Immediate Good; Recent Good; Remote Good  Judgment:  Good (Said that he would not want to do "the other kind" of mushrooms for "a very long time.")  Insight:  Good   Executive Functions  Concentration:  Fair  Attention Span:  Good  Recall:  Good  Fund of Knowledge:  Good  Language:  Good  Psychomotor Activity  Psychomotor Activity:  Psychomotor Activity: Normal AIMS Completed?: Yes  Physical Exam: Physical Exam Vitals and nursing note reviewed.  HENT:     Head: Normocephalic and atraumatic.  Pulmonary:     Effort: Pulmonary effort is normal.  Musculoskeletal:        General: Normal range of motion.     Left shoulder: Normal. No swelling, deformity or tenderness. Normal range of motion.     Right wrist:  Normal. No swelling, deformity or tenderness. Normal range of motion.     Comments: Negative empty can test, negative infraspinatus test, negative belly press test continue  Skin:    General: Skin is warm and dry.  Neurological:     General: No focal deficit present.     Mental Status: He is alert and oriented to  person, place, and time.  Psychiatric:        Behavior: Behavior normal.        Judgment: Judgment normal.     Review of Systems  Constitutional:  Negative for fever.  Cardiovascular:  Negative for chest pain and palpitations.  Gastrointestinal:  Negative for constipation, diarrhea, nausea and vomiting.  Musculoskeletal:  Positive for joint pain.  Neurological:  Negative for dizziness, weakness and headaches.    Blood pressure 100/81, pulse (!) 107, temperature 98.7 F (37.1 C), temperature source Oral, resp. rate 18, height 6\' 5"  (1.956 m), weight (!) 171 kg, SpO2 98%. Body mass index is 44.71 kg/m.   Treatment Plan  ASSESSMENT: Keldon Airey is a 27 year-old male with past psychiatric history of schizophrenia, paranoid type and past medical history of hypogonadism. He presented to [facility] ED for tachycardia and found to have mild rhabdomyolysis. Patient was medically stabilized and admitted to inpatient [facility] under IVC for possible exacerbation of schizophrenia.    Patient accurately states the name of his outpatient psychiatrist Dr. Kathryne Sharper. He notes his last visit was several months ago although on chart review his last virtual follow up was on 7/3. At the time, patient was reportedly taking Zoloft 50 mg and Risperdal 1 mg as per Dr. Clarise Cruz note, but patient denies taking all medications for the last several months as they were not working for him. He has had 2 prior psychiatric hospitalizations in the past, with the most recent in 2022 for bizarre behavior and psychosis.   7/30: Patient was less disorganized on exam than previously seen.  He was able to  communicate clearly and coherently throughout the interview without any odd remarks or departures in his thoughts.  After talking with Matthew Padilla, he thinks he can step down tomorrow with a planned discharge for Thursday. Told him that would be the plan as long as he can go for the rest of tonight and tomorrow morning without needing the agitation protocols. Matthew Padilla was able to verbalize agreement.  Will consider transitioning him off of Ativan for Wednesday as his agitation/antianxiety medication. The trial on the wider unit will be a safe way to assess how well Matthew Padilla is able to manage a slightly higher stress scenario such as the real world. Tomorrow (7/31) will propose the LAI for Matthew Padilla to his parents and proceed towards discharge). One medication will be roughly $100/mo with their insurance, but it would provide long-acting benefits for Matthew Padilla. Patient needs new labs just to confirm that he is doing well on these large doses of antipsychotics. Ordering an EKG, CMP, CK.    Diagnoses / Active Problems: -- Schizophrenia vs Schizoaffective Disorder -- Substance-Induced Psychosis   PLAN: Safety and Monitoring:             -- In-Voluntary admission to inpatient psychiatric unit for safety, stabilization and treatment             -- Daily contact with patient to assess and evaluate symptoms and progress in treatment             -- Patient's case to be discussed in multi-disciplinary team meeting             -- Observation Level : standard on acute unit             -- Vital signs:  q12 hours             -- Precautions: suicide, elopement, and assault   2. Psychiatric Diagnoses and  Treatment:  -- Continue Risperdal m-tabs, 4 mg oral dissolvable tablets BID for acute psychosis -- Continue Increase (daytime) thorazine to 50 mg disintegrating tablet twice daily (0700, 1500) for psychosis -- Continue hydroxyzine 25 mg as needed 3 times daily to scheduled 3 times daily -- Continue nighttime thorazine at 150 mg tablet at  2200 daily for psychosis  -- Add albuterol rescue inhaler Q6 PRN             -- Change PRNs diphenhydramine 50 mg / lorazepam 2 mg / thorazine 25 mg injection / 50 mg oral tablet              -- The risks/benefits/side-effects/alternatives to this medication were discussed in detail with the patient and time was given for questions. The patient consents to medication trial.              -- Metabolic profile and EKG monitoring obtained while on an atypical antipsychotic (Body mass index is 44.71 kg/m, Lipid Panel: HDL 21, LDL 82, triglycerides 113, total 126, HbgA1c: 5.5, QTc:435 on 7/16)              -- Encouraged patient to participate in unit milieu and in scheduled group therapies              -- Short Term Goals: Ability to demonstrate self-control will improve and Ability to identify triggers associated with substance abuse/mental health issues will improve             -- Long Term Goals: Improvement in symptoms so as ready for discharge               3. Medical Issues Being Addressed:  -- Labs reviewed, notable for recovering rhabdomyolysis, mild troponin leak, elevated AST, positive for THC.   -- Elevated blood glucose: Continue metformin XR 500 mg once in the morning   4. Discharge Planning:              -- Social work and case management to assist with discharge planning and identification of hospital follow-up needs prior to discharge             -- Estimated LOS: 5 days.              -- Discharge Concerns: Need to establish a safety plan; Medication compliance and effectiveness             -- Discharge Goals: Return home with outpatient referrals for mental health follow-up including medication management/psychotherapy  Signed: Christie Nottingham, MD [facility] Physician 05/04/2023 7:31 PM

## 2023-05-04 NOTE — Progress Notes (Signed)
   05/04/23 2015  Psych Admission Type (Psych Patients Only)  Admission Status Involuntary  Psychosocial Assessment  Patient Complaints Anxiety  Eye Contact Fair  Facial Expression Sad;Flat  Affect Anxious  Speech Tangential  Interaction Childlike  Motor Activity Pacing  Appearance/Hygiene Unremarkable  Behavior Characteristics Anxious  Mood Suspicious;Anxious;Guilty  Aggressive Behavior  Effect No apparent injury  Thought Process  Coherency Circumstantial;Disorganized;Tangential  Content Preoccupation;Paranoia  Delusions Paranoid  Perception Hallucinations  Hallucination Visual  Judgment Limited  Confusion WDL  Danger to Self  Current suicidal ideation? Denies

## 2023-05-04 NOTE — Group Note (Signed)
Recreation Therapy Group Note   Group Topic:Leisure Education  Group Date: 05/04/2023 Start Time: 1050 End Time: 1130 Facilitators: Makoto Sellitto-McCall, LRT,CTRS Location: [address]   Goal Area(s) Addresses:  Patient will successfully identify benefits of leisure participation. Patient will successfully identify ways to access leisure activities. Patient will successfully identify leisure activities.  Group Description: Patients were to group up in groups of 2-4 and create their ideal recreation/community center. Patients were to create a name of the facility, identify hours of operation, what activities will be offered, age group programs are geared to and how often programs will be offered.    Affect/Mood: N/A   Participation Level: Did not attend    Clinical Observations/Individualized Feedback:     Plan: Continue to engage patient in RT group sessions 2-3x/week.   Charlean Carneal-McCall, LRT,CTRS  05/04/2023 12:09 PM

## 2023-05-04 NOTE — Progress Notes (Signed)
The patient did not attend group.

## 2023-05-04 NOTE — Plan of Care (Signed)

## 2023-05-04 NOTE — Plan of Care (Signed)
  Problem: Education: Goal: Emotional status will improve Outcome: Progressing   Problem: Activity: Goal: Sleeping patterns will improve Outcome: Progressing   Problem: Activity: Goal: Sleeping patterns will improve Outcome: Progressing   Problem: Coping: Goal: Coping ability will improve Outcome: Progressing Goal: Will verbalize feelings Outcome: Progressing

## 2023-05-04 NOTE — Progress Notes (Signed)
   05/04/23 0825  Psych Admission Type (Psych Patients Only)  Admission Status Involuntary  Psychosocial Assessment  Patient Complaints Anxiety  Eye Contact Fair  Facial Expression Sad;Flat  Affect Anxious  Speech Tangential  Interaction Childlike  Motor Activity Slow  Appearance/Hygiene Unremarkable  Behavior Characteristics Anxious  Mood Suspicious;Guilty;Preoccupied  Aggressive Behavior  Effect No apparent injury  Thought Process  Coherency Circumstantial;Disorganized  Content Ambivalence;Preoccupation  Delusions Paranoid  Perception WDL  Hallucination None reported or observed  Judgment Limited  Confusion WDL  Danger to Self  Current suicidal ideation? Denies  Danger to Others  Danger to Others None reported or observed

## 2023-05-05 ENCOUNTER — Encounter (HOSPITAL_COMMUNITY): Payer: Self-pay

## 2023-05-05 ENCOUNTER — Telehealth (HOSPITAL_COMMUNITY): Payer: Self-pay | Admitting: Psychiatry

## 2023-05-05 DIAGNOSIS — F203 Undifferentiated schizophrenia: Secondary | ICD-10-CM | POA: Diagnosis not present

## 2023-05-05 NOTE — [facility] Group Notes (Signed)
Psychoeducational Group Note  Date:  05/05/2023 Time:  2000  Group Topic/Focus:  Narcotics Anonymous  Participation Level: Did Not Attend  Participation Quality:  Not Applicable  Affect:  Not Applicable  Cognitive:  Not Applicable  Insight:  Not Applicable  Engagement in Group: Not Applicable  Additional Comments:  Did not attend.  Marcille Buffy 05/05/2023, 10:19 PM

## 2023-05-05 NOTE — Plan of Care (Signed)

## 2023-05-05 NOTE — Telephone Encounter (Signed)
05/05/23 2:00pm The pt's mother Matthew Padilla #[phone] called apparently this patient is at [facility] and might be release on 8/2 - pt's mother is concern that her son is being over medicated on the Risperidone.  Requesting an earlier appt but Dr. Lolly Mustache doesn't have an other earlier appts.  Please advise.  Thanks

## 2023-05-05 NOTE — Group Note (Signed)
Recreation Therapy Group Note   Group Topic:Other  Group Date: 05/05/2023 Start Time: 1020 End Time: 1050 Facilitators: Nara Paternoster-McCall, LRT,CTRS Location: [address]   Goal Area(s) Addresses:  Patient will identify positive leisure and recreation activities.  Patient will identify one positive benefit of participation in leisure activities.    Group Description: Music Trivia. Patients were divided in to 2 groups for game play. LRT read song lyrics from songs in the  1990s and 2000s hip hop and R&B from a playing card. Each team takes turns answering the question. If they are unable to answer the question, the other team gets the chance to steal the point. The team with the most points wins the game.    Affect/Mood: Appropriate   Participation Level: Engaged   Participation Quality: Independent   Behavior: Appropriate   Speech/Thought Process: Focused   Insight: Good   Judgement: Good   Modes of Intervention: Competitive Play   Patient Response to Interventions:  Engaged   Education Outcome:  Acknowledges education   Clinical Observations/Individualized Feedback: Pt was engaged and interacted well with peers during group session. Pt gave some random answers that didn't have anything to do with the game. Pt was bright and appropriate throughout group.     Plan: Continue to engage patient in RT group sessions 2-3x/week.   Jeven Topper-McCall, LRT,CTRS  05/05/2023 12:44 PM

## 2023-05-05 NOTE — Progress Notes (Signed)
   05/05/23 0800  Psych Admission Type (Psych Patients Only)  Admission Status Involuntary  Psychosocial Assessment  Patient Complaints Anxiety  Eye Contact Fair  Facial Expression Anxious;Animated  Affect Anxious  Speech Tangential  Interaction Childlike  Motor Activity Pacing  Appearance/Hygiene Unremarkable  Behavior Characteristics Anxious  Mood Anxious  Aggressive Behavior  Effect No apparent injury  Thought Process  Coherency Circumstantial  Content Preoccupation  Delusions Paranoid  Perception WDL  Hallucination None reported or observed  Judgment Limited  Confusion Mild  Danger to Self  Current suicidal ideation? Denies  Danger to Others  Danger to Others None reported or observed

## 2023-05-05 NOTE — BH IP Treatment Plan (Signed)
Interdisciplinary Treatment and Diagnostic Plan Update  05/05/2023 Time of Session: 245 Matthew Padilla MRN: [id number]  Principal Diagnosis: Schizophrenia [facility])  Secondary Diagnoses: Principal Problem:   Schizophrenia (HCC)   Current Medications:  Current Facility-Administered Medications  Medication Dose Route Frequency Provider Last Rate Last Admin   acetaminophen (TYLENOL) tablet 650 mg  650 mg Oral Q6H PRN Eligha Bridegroom, NP   650 mg at 05/03/23 1937   albuterol (VENTOLIN HFA) 108 (90 Base) MCG/ACT inhaler 2 puff  2 puff Inhalation Q6H PRN Margaretmary Dys, MD   2 puff at 05/03/23 0857   alum & mag hydroxide-simeth (MAALOX/MYLANTA) 200-200-20 MG/5ML suspension 30 mL  30 mL Oral Q4H PRN Eligha Bridegroom, NP   30 mL at 04/25/23 1115   chlorproMAZINE (THORAZINE) injection 25 mg  25 mg Intramuscular TID PRN Massengill, Harrold Donath, MD       chlorproMAZINE (THORAZINE) tablet 150 mg  150 mg Oral QHS Margaretmary Dys, MD   150 mg at 05/04/23 2033   chlorproMAZINE (THORAZINE) tablet 50 mg  50 mg Oral TID PRN Phineas Inches, MD   50 mg at 05/03/23 1255   chlorproMAZINE (THORAZINE) tablet 50 mg  50 mg Oral BID Margaretmary Dys, MD   50 mg at 05/05/23 1402   cloNIDine (CATAPRES) tablet 0.1 mg  0.1 mg Oral Q4H PRN Margaretmary Dys, MD       gabapentin (NEURONTIN) capsule 200 mg  200 mg Oral QHS Massengill, Harrold Donath, MD   200 mg at 05/04/23 2033   hydrOXYzine (ATARAX) tablet 25 mg  25 mg Oral TID Margaretmary Dys, MD   25 mg at 05/05/23 1402   loratadine (CLARITIN) tablet 10 mg  10 mg Oral Daily Margaretmary Dys, MD   10 mg at 05/05/23 0818   LORazepam (ATIVAN) tablet 2 mg  2 mg Oral TID PRN Phineas Inches, MD   2 mg at 05/02/23 1537   Or   LORazepam (ATIVAN) injection 2 mg  2 mg Intramuscular TID PRN Massengill, Harrold Donath, MD       magnesium hydroxide (MILK OF MAGNESIA) suspension 30 mL  30 mL Oral Daily  PRN Eligha Bridegroom, NP   30 mL at 05/04/23 0902   metFORMIN (GLUCOPHAGE-XR) 24 hr tablet 500 mg  500 mg Oral Q breakfast Margaretmary Dys, MD   500 mg at 05/05/23 0818   nicotine polacrilex (NICORETTE) gum 2 mg  2 mg Oral PRN Phineas Inches, MD   2 mg at 05/02/23 1455   polyethylene glycol (MIRALAX / GLYCOLAX) packet 17 g  17 g Oral Daily Margaretmary Dys, MD   17 g at 05/04/23 1012   risperiDONE (RISPERDAL M-TABS) disintegrating tablet 4 mg  4 mg Oral BID Margaretmary Dys, MD   4 mg at 05/05/23 0818   traZODone (DESYREL) tablet 200 mg  200 mg Oral QHS PRN Phineas Inches, MD   200 mg at 05/04/23 2033   PTA Medications: Medications Prior to Admission  Medication Sig Dispense Refill Last Dose   acetaminophen (TYLENOL) 500 MG tablet Take 500 mg by mouth every 6 (six) hours as needed for moderate pain.      clomiPHENE (CLOMID) 50 MG tablet Take 1 tablet (50 mg total) by mouth daily. 30 tablet 6    ibuprofen (ADVIL) 200 MG tablet Take 200-400 mg by mouth every 6 (six) hours as needed for moderate pain.      risperiDONE (RISPERDAL) 1 MG  tablet TAKE 1 TABLET(1 MG) BY MOUTH AT BEDTIME 30 tablet 2    sertraline (ZOLOFT) 50 MG tablet Take 0.5-1 tablets (25-50 mg total) by mouth daily. (Patient not taking: Reported on 04/18/2023) 30 tablet 2     Patient Stressors: Health problems   Medication change or noncompliance   Substance abuse    Patient Strengths: Supportive family/friends   Treatment Modalities: Medication Management, Group therapy, Case management,  1 to 1 session with clinician, Psychoeducation, Recreational therapy.   Physician Treatment Plan for Primary Diagnosis: Schizophrenia (HCC) Long Term Goal(s): Improvement in symptoms so as ready for discharge   Short Term Goals: Ability to demonstrate self-control will improve Ability to identify triggers associated with substance abuse/mental health issues will improve  Medication  Management: Evaluate patient's response, side effects, and tolerance of medication regimen.  Therapeutic Interventions: 1 to 1 sessions, Unit Group sessions and Medication administration.  Evaluation of Outcomes: Progressing  Physician Treatment Plan for Secondary Diagnosis: Principal Problem:   Schizophrenia (HCC)  Long Term Goal(s): Improvement in symptoms so as ready for discharge   Short Term Goals: Ability to demonstrate self-control will improve Ability to identify triggers associated with substance abuse/mental health issues will improve     Medication Management: Evaluate patient's response, side effects, and tolerance of medication regimen.  Therapeutic Interventions: 1 to 1 sessions, Unit Group sessions and Medication administration.  Evaluation of Outcomes: Progressing   RN Treatment Plan for Primary Diagnosis: Schizophrenia (HCC) Long Term Goal(s): Knowledge of disease and therapeutic regimen to maintain health will improve  Short Term Goals: Ability to remain free from injury will improve, Ability to verbalize frustration and anger appropriately will improve, Ability to demonstrate self-control, Ability to participate in decision making will improve, Ability to verbalize feelings will improve, Ability to disclose and discuss suicidal ideas, Ability to identify and develop effective coping behaviors will improve, and Compliance with prescribed medications will improve  Medication Management: RN will administer medications as ordered by provider, will assess and evaluate patient's response and provide education to patient for prescribed medication. RN will report any adverse and/or side effects to prescribing provider.  Therapeutic Interventions: 1 on 1 counseling sessions, Psychoeducation, Medication administration, Evaluate responses to treatment, Monitor vital signs and CBGs as ordered, Perform/monitor CIWA, COWS, AIMS and Fall Risk screenings as ordered, Perform wound care  treatments as ordered.  Evaluation of Outcomes: Progressing   LCSW Treatment Plan for Primary Diagnosis: Schizophrenia (HCC) Long Term Goal(s): Safe transition to appropriate next level of care at discharge, Engage patient in therapeutic group addressing interpersonal concerns.  Short Term Goals: Engage patient in aftercare planning with referrals and resources, Increase social support, Increase ability to appropriately verbalize feelings, Increase emotional regulation, Facilitate acceptance of mental health diagnosis and concerns, Facilitate patient progression through stages of change regarding substance use diagnoses and concerns, Identify triggers associated with mental health/substance abuse issues, and Increase skills for wellness and recovery  Therapeutic Interventions: Assess for all discharge needs, 1 to 1 time with Social worker, Explore available resources and support systems, Assess for adequacy in community support network, Educate family and significant other(s) on suicide prevention, Complete Psychosocial Assessment, Interpersonal group therapy.  Evaluation of Outcomes: Progressing   Progress in Treatment: Attending groups: Yes. Participating in groups: Yes. Taking medication as prescribed: Yes. Toleration medication: Yes. Family/Significant other contact made: Yes, individual(s) contacted:  mom Matthew Padilla [phone] Patient understands diagnosis: Yes. Discussing patient identified problems/goals with staff: Yes. Medical problems stabilized or resolved: Yes. Denies suicidal/homicidal ideation: Yes.  Issues/concerns per patient self-inventory: Yes. Other: N/A  New problem(s) identified: No, Describe:  None reported  New Short Term/Long Term Goal(s): medication stabilization, elimination of SI thoughts, development of comprehensive mental wellness plan.   Patient Goals:  Coping Skills  Discharge Plan or Barriers: Patient recently admitted. CSW will continue to follow  and assess for appropriate referrals and possible discharge planning.   Reason for Continuation of Hospitalization: Delusions  Hallucinations Medication stabilization  Estimated Length of Stay: 5-7 Days  Last 3 [place] Suicide Severity Risk Score: Flowsheet Row Ad[facility] (Current) [facility] from 04/18/2023 in [facility] Counselor from 02/15/2023 in [facility] at [facility] RISK CATEGORY No Risk No Risk Error: Question 1 not populated       Last [facility] 2/9 Scores:    01/05/2023   12:35 PM 08/13/2022    7:46 AM 09/08/2021    1:27 PM  Depression screen PHQ 2/9  Decreased Interest 0 0 1  Down, Depressed, Hopeless 0 0 0  PHQ - 2 Score 0 0 1  Altered sleeping  0   Tired, decreased energy  0   Change in appetite  0   Feeling bad or failure about yourself   1   Trouble concentrating  0   Moving slowly or fidgety/restless  0   Suicidal thoughts  0   PHQ-9 Score  1   Difficult doing work/chores  Not difficult at all      medication stabilization, elimination of SI thoughts, development of comprehensive mental wellness plan.   Scribe for Treatment Team: Ane Payment, LCSW 05/05/2023 2:49 PM

## 2023-05-05 NOTE — Progress Notes (Signed)
[facility] MD Progress Note  05/05/2023 10:41 AM Matthew Padilla  MRN:  [id number] Principal Problem: Schizophrenia (HCC) Diagnosis: Principal Problem:   Schizophrenia (HCC)   Subjective:   Matthew Padilla is a 27 year-old male with past psychiatric history of schizophrenia, paranoid type and past medical history of hypogonadism. He presented to [facility] ED for psychosis and tachycardia and found to have mild rhabdomyolysis. Patient was medically stabilized and admitted to inpatient [facility] under IVC for possible exacerbation of schizophrenia.   Case was discussed in the multidisciplinary team. MAR was reviewed and patient was compliant with medications.   PRNs in the last 24 hours: 07/30 2033 trazodone 200 mg 07/30 0902 milk of magnesia  Psychiatric Team made the following recs yesterday: -- Continue Risperdal m-tabs, 4 mg oral dissolvable tablets BID for acute psychosis -- Continue Increase (daytime) thorazine to 50 mg disintegrating tablet twice daily (0700, 1500) for psychosis -- Continue hydroxyzine 25 mg as needed 3 times daily to scheduled 3 times daily -- Continue nighttime thorazine at 150 mg tablet at 2200 daily for psychosis  -- Add albuterol rescue inhaler Q6 PRN             -- Change PRNs diphenhydramine 50 mg / lorazepam 2 mg / thorazine 25 mg injection / 50 mg oral tablet             -- Start Gabapentin 200 mg at bedtime     This morning on exam he reported sleeping well and improved bowel movements as compared to yesterday. In terms of psychiatric symptoms, Matthew Padilla denies depressive symptoms and reports one instance this morning of anxious thoughts that he was able to overcome by thinking through it. His only reported physical complaint was a minor cramping in his calf, which has been present for several days. Matthew Padilla was more engaged in conversation today and seemed excited when told he would be moving to a different room. He reports father came to visit him yesterday evening. No signs of  the aggression, paranoia, or inappropriate male-oriented attention from his initial presentation.  Sleep: Good Appetite:  Good Depression: Denies Anxiety: Denies Auditory Hallucinations: Denies. No signs. Visual Hallucinations: Denies, no signs Paranoia: Denies [id number] SI: Denies Side effects from medications: does not endorse any side-effects they attribute to medications.  Total time spent with patient: 20 minutes  Past psychiatric history:  Diagnosed with schizophrenia in 2022 after previous hospitalization for psychosis. Pt has experienced past psychotic episodes in the summer months and depressive symptoms in the fall. Two total prior psychiatric hospitalizations in the past, with the most recent in 2022 for bizarre behavior and psychosis.   Past Medical History:  Past Medical History:  Diagnosis Date   Anxiety    Low testosterone in male    Paranoid schizophrenia (HCC)     History reviewed. No pertinent surgical history. Family History: History reviewed. No pertinent family history. Family Psychiatric History:  fmhx of bipolar disorder in patient's aunt well-controlled on lithium.   Social History:  Social History   Substance and Sexual Activity  Alcohol Use Not Currently     Social History   Substance and Sexual Activity  Drug Use Yes   Types: Other-see comments    Social History   Socioeconomic History   Marital status: Single    Spouse name: Not on file   Number of children: Not on file   Years of education: Not on file   Highest education level: Not on file  Occupational History  Not on file  Tobacco Use   Smoking status: Never   Smokeless tobacco: Never  Substance and Sexual Activity   Alcohol use: Not Currently   Drug use: Yes    Types: Other-see comments   Sexual activity: Never  Other Topics Concern   Not on file  Social History Narrative   Not on file   Social Determinants of Health   Financial Resource Strain: Not on file  Food  Insecurity: Patient Declined (04/19/2023)   Hunger Vital Sign    Worried About Running Out of Food in the Last Year: Patient declined    Ran Out of Food in the Last Year: Patient declined  Transportation Needs: Patient Declined (04/19/2023)   PRAPARE - Administrator, Civil Service (Medical): Patient declined    Lack of Transportation (Non-Medical): Patient declined  Physical Activity: Not on file  Stress: Not on file  Social Connections: Not on file    Current Medications: Current Facility-Administered Medications  Medication Dose Route Frequency Provider Last Rate Last Admin   acetaminophen (TYLENOL) tablet 650 mg  650 mg Oral Q6H PRN Eligha Bridegroom, NP   650 mg at 05/03/23 1937   albuterol (VENTOLIN HFA) 108 (90 Base) MCG/ACT inhaler 2 puff  2 puff Inhalation Q6H PRN Margaretmary Dys, MD   2 puff at 05/03/23 0857   alum & mag hydroxide-simeth (MAALOX/MYLANTA) 200-200-20 MG/5ML suspension 30 mL  30 mL Oral Q4H PRN Eligha Bridegroom, NP   30 mL at 04/25/23 1115   chlorproMAZINE (THORAZINE) injection 25 mg  25 mg Intramuscular TID PRN Massengill, Harrold Donath, MD       chlorproMAZINE (THORAZINE) tablet 150 mg  150 mg Oral QHS Margaretmary Dys, MD   150 mg at 05/04/23 2033   chlorproMAZINE (THORAZINE) tablet 50 mg  50 mg Oral TID PRN Phineas Inches, MD   50 mg at 05/03/23 1255   chlorproMAZINE (THORAZINE) tablet 50 mg  50 mg Oral BID Margaretmary Dys, MD   50 mg at 05/04/23 1451   cloNIDine (CATAPRES) tablet 0.1 mg  0.1 mg Oral Q4H PRN Margaretmary Dys, MD       gabapentin (NEURONTIN) capsule 200 mg  200 mg Oral QHS Massengill, Harrold Donath, MD   200 mg at 05/04/23 2033   hydrOXYzine (ATARAX) tablet 25 mg  25 mg Oral TID Margaretmary Dys, MD   25 mg at 05/05/23 0818   loratadine (CLARITIN) tablet 10 mg  10 mg Oral Daily Margaretmary Dys, MD   10 mg at 05/05/23 0818   LORazepam (ATIVAN) tablet 2 mg  2  mg Oral TID PRN Phineas Inches, MD   2 mg at 05/02/23 1537   Or   LORazepam (ATIVAN) injection 2 mg  2 mg Intramuscular TID PRN Massengill, Harrold Donath, MD       magnesium hydroxide (MILK OF MAGNESIA) suspension 30 mL  30 mL Oral Daily PRN Eligha Bridegroom, NP   30 mL at 05/04/23 0902   metFORMIN (GLUCOPHAGE-XR) 24 hr tablet 500 mg  500 mg Oral Q breakfast Margaretmary Dys, MD   500 mg at 05/05/23 0818   nicotine polacrilex (NICORETTE) gum 2 mg  2 mg Oral PRN Phineas Inches, MD   2 mg at 05/02/23 1455   polyethylene glycol (MIRALAX / GLYCOLAX) packet 17 g  17 g Oral Daily Margaretmary Dys, MD   17 g at 05/04/23 1012   risperiDONE (RISPERDAL M-TABS) disintegrating tablet  4 mg  4 mg Oral BID Margaretmary Dys, MD   4 mg at 05/05/23 0818   traZODone (DESYREL) tablet 200 mg  200 mg Oral QHS PRN Phineas Inches, MD   200 mg at 05/04/23 2033    Lab Results:  No results found for this or any previous visit (from the past 48 hour(s)).    Blood Alcohol level:  Lab Results  Component Value Date   ETH <10 04/18/2023   ETH <10 07/08/2021    Metabolic Disorder Labs: Lab Results  Component Value Date   HGBA1C 5.4 04/22/2023   MPG 108 04/22/2023   Lab Results  Component Value Date   PROLACTIN 22.3 (H) 09/11/2022   Lab Results  Component Value Date   CHOL 126 04/22/2023   TRIG 113 04/22/2023   HDL 21 (L) 04/22/2023   CHOLHDL 6.0 04/22/2023   VLDL 23 04/22/2023   LDLCALC 82 04/22/2023    Physical Findings: AIMS: Facial and Oral Movements Muscles of Facial Expression: None, normal Lips and Perioral Area: None, normal Jaw: None, normal Tongue: None, normal,Extremity Movements Upper (arms, wrists, hands, fingers): None, normal Lower (legs, knees, ankles, toes): None, normal, Trunk Movements Neck, shoulders, hips: None, normal, Overall Severity Severity of abnormal movements (highest score from questions above): None,  normal Incapacitation due to abnormal movements: None, normal Patient's awareness of abnormal movements (rate only patient's report): No Awareness, Dental Status Current problems with teeth and/or dentures?: No Does patient usually wear dentures?: No   Musculoskeletal: Strength & Muscle Tone: within normal limits Gait & Station: normal Patient leans: N/A  Psychiatric Specialty Exam:  Presentation  General Appearance:   Appropriate for Environment   Eye Contact:  Good  Speech:  Clear and Coherent; Normal Rate  Speech Volume:  Normal  Handedness:  Right    Mood and Affect  Mood:  Anxious  Affect:  Appropriate; Full Range   Thought Process  Thought Processes:  Coherent; Goal Directed; Linear  Descriptions of Associations: Circumstantial  Orientation: Full (Time, Place and Person)  Thought Content: Abstract Reasoning; Logical  History of Schizophrenia/Schizoaffective disorder: Yes  Duration of Psychotic Symptoms: Greater than six months  Hallucinations: Hallucinations: None  Ideas of Reference: None  Suicidal Thoughts: Suicidal Thoughts: No Homicidal Thoughts: Homicidal Thoughts: No   Sensorium  Memory: Immediate Good; Recent Good; Remote Good  Judgment:  Good  Insight:  Good   Executive Functions  Concentration:  Good  Attention Span:  Good  Recall:  Good  Fund of Knowledge:  Good  Language:  Good  Psychomotor Activity  Psychomotor Activity:  Psychomotor Activity: Normal AIMS Completed?: No  Physical Exam: Physical Exam Vitals and nursing note reviewed.  HENT:     Head: Normocephalic and atraumatic.  Pulmonary:     Effort: Pulmonary effort is normal.  Musculoskeletal:        General: Normal range of motion.     Left shoulder: Normal. No swelling, deformity or tenderness. Normal range of motion.     Right wrist: Normal. No swelling, deformity or tenderness. Normal range of motion.  Skin:    General: Skin is warm and  dry.  Neurological:     General: No focal deficit present.     Mental Status: He is alert and oriented to person, place, and time.  Psychiatric:        Behavior: Behavior normal.        Judgment: Judgment normal.    Review of Systems  Constitutional:  Negative for  fever.  Cardiovascular:  Negative for chest pain and palpitations.  Gastrointestinal:  Negative for constipation, diarrhea, nausea and vomiting.  Musculoskeletal:  Positive for joint pain.  Neurological:  Negative for dizziness, weakness and headaches.    Blood pressure 112/65, pulse (!) 109, temperature 98.8 F (37.1 C), temperature source Oral, resp. rate 18, height 6\' 5"  (1.956 m), weight (!) 171 kg, SpO2 98%. Body mass index is 44.71 kg/m.   Treatment Plan  ASSESSMENT: Matthew Padilla is a 27 year-old male with past psychiatric history of schizophrenia, paranoid type and past medical history of hypogonadism. He presented to [facility] ED for tachycardia and found to have mild rhabdomyolysis. Patient was medically stabilized and admitted to inpatient [facility] under IVC for possible exacerbation of schizophrenia.    Patient accurately states the name of his outpatient psychiatrist Dr. Kathryne Sharper. He notes his last visit was several months ago although on chart review his last virtual follow up was on 7/3. At the time, patient was reportedly taking Zoloft 50 mg and Risperdal 1 mg as per Dr. Clarise Cruz note, but patient denies taking all medications for the last several months as they were not working for him. He has had 2 prior psychiatric hospitalizations in the past, with the most recent in 2022 for bizarre behavior and psychosis.   7/31: Patient was able to communicate clearly and coherently throughout today's interview, remaining engaged and responding appropriately. Patient was agreeable to step down today with planned discharge for tomorrow. When asked how his parents felt about his discharge, Matthew Padilla showed some difficulty with  abstract thinking but otherwise demonstrates appropriate theory of mind. Will continue Ativan today as he transitions to step down unit as this will likely be a higher stress environment for him. Planning on starting LAI prior to discharge - pending conversation with parents. One medication will be roughly $100/mo with their insurance, but it would provide long-acting benefits for [name]. Patient needs new labs just to confirm that he is doing well on these large doses of antipsychotics. Ordering an EKG, CMP, CK.    Diagnoses / Active Problems: -- Schizophrenia vs Schizoaffective Disorder -- Substance-Induced Psychosis   PLAN: Safety and Monitoring:             -- In-Voluntary admission to inpatient psychiatric unit for safety, stabilization and treatment             -- Daily contact with patient to assess and evaluate symptoms and progress in treatment             -- Patient's case to be discussed in multi-disciplinary team meeting             -- Observation Level : standard on acute unit             -- Vital signs:  q12 hours             -- Precautions: suicide, elopement, and assault   2. Psychiatric Diagnoses and Treatment:  -- Continue Risperdal m-tabs, 4 mg oral dissolvable tablets BID for acute psychosis -- Continue Increase (daytime) thorazine to 50 mg disintegrating tablet twice daily (0700, 1500) for psychosis -- Continue hydroxyzine 25 mg as needed 3 times daily to scheduled 3 times daily -- Continue nighttime thorazine at 150 mg tablet at 2200 daily for psychosis  -- Add albuterol rescue inhaler Q6 PRN             -- Change PRNs diphenhydramine 50 mg / lorazepam 2 mg / thorazine 25  mg injection / 50 mg oral tablet  -- Continue gabapentin 200 mg at bedtime              -- The risks/benefits/side-effects/alternatives to this medication were discussed in detail with the patient and time was given for questions. The patient consents to medication trial.              -- Metabolic profile  and EKG monitoring obtained while on an atypical antipsychotic (Body mass index is 44.71 kg/m, Lipid Panel: HDL 21, LDL 82, triglycerides 113, total 126, HbgA1c: 5.5, QTc:435 on 7/16)              -- Encouraged patient to participate in unit milieu and in scheduled group therapies              -- Short Term Goals: Ability to demonstrate self-control will improve and Ability to identify triggers associated with substance abuse/mental health issues will improve             -- Long Term Goals: Improvement in symptoms so as ready for discharge               3. Medical Issues Being Addressed:  -- Labs reviewed, notable for recovering rhabdomyolysis, mild troponin leak, elevated AST, positive for THC.   -- Elevated blood glucose: Continue metformin XR 500 mg once in the morning   4. Discharge Planning:              -- Social work and case management to assist with discharge planning and identification of hospital follow-up needs prior to discharge             -- Estimated LOS: 5 days.              -- Discharge Concerns: Need to establish a safety plan; Medication compliance and effectiveness             -- Discharge Goals: Return home with outpatient referrals for mental health follow-up including medication management/psychotherapy  Signed: Christie Nottingham, MD [facility] Physician 05/05/2023 10:41 AM

## 2023-05-06 DIAGNOSIS — F203 Undifferentiated schizophrenia: Secondary | ICD-10-CM | POA: Diagnosis not present

## 2023-05-06 MED ORDER — POLYETHYLENE GLYCOL 3350 17 G PO PACK: 17.0000 g | PACK | Freq: Every day | ORAL | 0 refills | Status: DC | PRN

## 2023-05-06 MED ORDER — TRAZODONE HCL 100 MG PO TABS: 200.0000 mg | ORAL_TABLET | Freq: Every evening | ORAL | 0 refills | Status: DC | PRN

## 2023-05-06 MED ORDER — RISPERIDONE 2 MG PO TABS
4.0000 mg | ORAL_TABLET | Freq: Two times a day (BID) | ORAL | Status: DC
  Filled 2023-05-06: qty 2

## 2023-05-06 MED ORDER — ALBUTEROL SULFATE HFA 108 (90 BASE) MCG/ACT IN AERS: 2.0000 | INHALATION_SPRAY | Freq: Four times a day (QID) | RESPIRATORY_TRACT | 0 refills | Status: AC | PRN

## 2023-05-06 MED ORDER — METFORMIN HCL ER 500 MG PO TB24: 500.0000 mg | ORAL_TABLET | Freq: Every day | ORAL | 0 refills | Status: DC

## 2023-05-06 MED ORDER — LORATADINE 10 MG PO TABS: 10.0000 mg | ORAL_TABLET | Freq: Every day | ORAL | Status: DC

## 2023-05-06 MED ORDER — RISPERIDONE 4 MG PO TABS: 4.0000 mg | ORAL_TABLET | Freq: Two times a day (BID) | ORAL | 0 refills | Status: DC

## 2023-05-06 MED ORDER — CHLORPROMAZINE HCL 50 MG PO TABS: 50.0000 mg | ORAL_TABLET | Freq: Two times a day (BID) | ORAL | 0 refills | Status: DC

## 2023-05-06 MED ORDER — GABAPENTIN 100 MG PO CAPS: 200.0000 mg | ORAL_CAPSULE | Freq: Every day | ORAL | 0 refills | Status: DC

## 2023-05-06 MED ORDER — CHLORPROMAZINE HCL 50 MG PO TABS: 150.0000 mg | ORAL_TABLET | Freq: Every day | ORAL | 0 refills | Status: DC

## 2023-05-06 MED ORDER — HYDROXYZINE HCL 25 MG PO TABS: 25.0000 mg | ORAL_TABLET | Freq: Three times a day (TID) | ORAL | 0 refills | Status: AC | PRN

## 2023-05-06 MED ORDER — NICOTINE POLACRILEX 2 MG MT GUM: 2.0000 mg | CHEWING_GUM | OROMUCOSAL | 0 refills | Status: DC | PRN

## 2023-05-06 NOTE — Plan of Care (Signed)
   Problem: Education: Goal: Emotional status will improve Outcome: Progressing Goal: Mental status will improve Outcome: Progressing   Problem: Coping: Goal: Ability to demonstrate self-control will improve Outcome: Progressing   

## 2023-05-06 NOTE — Progress Notes (Signed)
   05/05/23 2200  Psych Admission Type (Psych Patients Only)  Admission Status Involuntary  Psychosocial Assessment  Patient Complaints Anxiety  Eye Contact Fair  Facial Expression Flat  Affect Anxious  Speech Tangential  Interaction Childlike;Cautious  Motor Activity Slow  Appearance/Hygiene Unremarkable  Behavior Characteristics Anxious  Mood Anxious  Aggressive Behavior  Effect No apparent injury  Thought Process  Coherency Circumstantial;Disorganized  Content Preoccupation  Delusions Paranoid  Perception WDL  Hallucination None reported or observed  Judgment Limited  Confusion Mild  Danger to Self  Current suicidal ideation? Denies  Danger to Others  Danger to Others None reported or observed

## 2023-05-06 NOTE — Progress Notes (Signed)
   05/06/23 0558  15 Minute Checks  Location Bedroom  Visual Appearance Calm  Behavior Sleeping  Sleep (Behavioral Health Patients Only)  Calculate sleep? (Click Yes once per 24 hr at 0600 safety check) Yes  Documented sleep last 24 hours 9.75

## 2023-05-06 NOTE — Progress Notes (Signed)
   05/06/23 0952  Psych Admission Type (Psych Patients Only)  Admission Status Involuntary  Psychosocial Assessment  Patient Complaints Anxiety  Eye Contact Fair  Facial Expression Flat  Affect Anxious  Speech Tangential  Interaction Childlike;Cautious  Motor Activity Slow  Appearance/Hygiene Unremarkable  Behavior Characteristics Anxious  Mood Anxious  Aggressive Behavior  Effect No apparent injury  Thought Process  Coherency Circumstantial;Disorganized  Content Preoccupation  Delusions Paranoid  Perception WDL  Hallucination None reported or observed  Judgment Limited  Confusion Mild  Danger to Self  Current suicidal ideation? Denies  Danger to Others  Danger to Others None reported or observed

## 2023-05-06 NOTE — Progress Notes (Signed)
  [facility] Adult Case Management Discharge Plan :  Will you be returning to the same living situation after discharge:  Yes,  Patient will be returning back with mom  At discharge, do you have transportation home?: Yes,  Mom will pick patient up around around 11:30 AM  Do you have the ability to pay for your medications: Yes,  [employer]   Release of information consent forms completed and in the chart;  Patient's signature needed at discharge.  Patient to Follow up at:  Follow-up Information     [facility] Follow up on 05/14/2023.   Why: You have an appointment for therapy services on 05/14/23 at 8:00 am.  This will be a Virtual appointment via the telephone. Contact information: [address]  [address] [place] [address] [phone]         [facility] Follow up on 05/18/2023.   Specialty: Behavioral Health Why: 05/18/23 at 11:30 am for medication management services Contact information: [address] [place] [address] [phone]                Next level of care provider has access to [facility] Link:yes  Safety Planning and Suicide Prevention discussed: Yes,  [place] [phone]     Has patient been referred to the Quitline?: Patient does not use tobacco/nicotine products  Patient has been referred for addiction treatment: No known substance use disorder. Other than using shrooms   Isabella Bowens, LCSWA 05/06/2023, 10:04 AM

## 2023-05-06 NOTE — Group Note (Signed)
Date:  05/06/2023 Time:  10:46 AM  Group Topic/Focus:  Goals Group:   The focus of this group is to help patients establish daily goals to achieve during treatment and discuss how the patient can incorporate goal setting into their daily lives to aide in recovery.    Participation Level:  Active  Participation Quality:  Appropriate  Affect:  Appropriate  Cognitive:  Alert  Insight: Appropriate  Engagement in Group:  Engaged  Modes of Intervention:  Discussion  Additional Comments:    Beckie Busing 05/06/2023, 10:46 AM

## 2023-05-06 NOTE — Discharge Instructions (Signed)

## 2023-05-06 NOTE — Discharge Summary (Signed)
Physician Discharge Summary Note  Patient:  Matthew Padilla is an 27 y.o., male MRN:  [id number] DOB:  [birth year] Patient phone:  [phone] (home)  Patient address:   [address] [place] [address],  Total Time spent with patient: 20 minutes  Date of Admission:  04/19/2023 Date of Discharge: 05-06-2023  Reason for Admission:    Matthew Padilla is a 16 y M with past psychiatric history of schizophrenia, paranoid type and past medical history of hypogonadism. He presented to [facility] ED for tachycardia and found to have mild rhabdomyolysis. Patient was medically stabilized and admitted to inpatient [facility] under IVC for possible exacerbation of schizophrenia.     Principal Problem: Schizophrenia [facility]) Discharge Diagnoses: Principal Problem:   Schizophrenia [facility])   Past Psychiatric History:  schizophrenia, paranoid type  Prior Inpatient Therapy: Yes.   Psychiatric hospitalization in 2022 for psychosis. Prior Outpatient Therapy: Yes.   Psychiatric follow-up with Dr. Kathi Ludwig   Past Medical History:  Past Medical History:  Diagnosis Date   Anxiety    Low testosterone in male    Paranoid schizophrenia (HCC)    History reviewed. No pertinent surgical history. Family History: History reviewed. No pertinent family history.  Family Psychiatric  History: See H&P   Social History:  Social History   Substance and Sexual Activity  Alcohol Use Not Currently     Social History   Substance and Sexual Activity  Drug Use Yes   Types: Other-see comments    Social History   Socioeconomic History   Marital status: Single    Spouse name: Not on file   Number of children: Not on file   Years of education: Not on file   Highest education level: Not on file  Occupational History   Not on file  Tobacco Use   Smoking status: Never   Smokeless tobacco: Never  Substance and Sexual Activity   Alcohol use: Not Currently   Drug use: Yes    Types: Other-see comments   Sexual  activity: Never  Other Topics Concern   Not on file  Social History Narrative   Not on file   Social Determinants of Health   Financial Resource Strain: Not on file  Food Insecurity: Patient Declined (04/19/2023)   Hunger Vital Sign    Worried About Running Out of Food in the Last Year: Patient declined    Ran Out of Food in the Last Year: Patient declined  Transportation Needs: Patient Declined (04/19/2023)   PRAPARE - Administrator, Civil Service (Medical): Patient declined    Lack of Transportation (Non-Medical): Patient declined  Physical Activity: Not on file  Stress: Not on file  Social Connections: Not on file    Hospital Course:   During the patient's hospitalization, patient had extensive initial psychiatric evaluation, and follow-up psychiatric evaluations every day.  Psychiatric diagnoses provided upon initial assessment:  Schizophrenia Hallucinogenic use disorder  Patient's psychiatric medications were adjusted on admission:    -- Start Risperdal m-tabs, 2 mg oral dissolvable tablets BID for acute psychosis    -- Continue Propranolol 40 mg TID for tachycardia, blood pressure, anxiolysis.  During the hospitalization, other adjustments were made to the patient's psychiatric medication regimen:  -risperdal was increased to 4 mg bid  -zyprexa was started but stopped due to ineffectiveness -seroquel was started but stopped due to ineffectiveness -thorazine was started and increased to 50 mg qam, 50 mg qpm, and 150 mg at bedtime  -gabapentin 200 mg at bedtime  was started for restless legs  -metformin was started to mitigate antipsychotic metabolic changes  -Propranolol was dc due to asthma -albuterol was started  Patient's care was discussed during the interdisciplinary team meeting every day during the hospitalization.  The patient denied having side effects to prescribed psychiatric medication.  Gradually, patient started adjusting to milieu. The  patient was evaluated each day by a clinical provider to ascertain response to treatment. Improvement was noted by the patient's report of decreasing symptoms, improved sleep and appetite, affect, medication tolerance, behavior, and participation in unit programming.  Patient was asked each day to complete a self inventory noting mood, mental status, pain, new symptoms, anxiety and concerns.    Symptoms were reported as significantly decreased or resolved completely by discharge.   On day of discharge, the patient reports that their mood is stable. The patient denied having suicidal thoughts for more than 48 hours prior to discharge.  Patient denies having homicidal thoughts.  Patient denies having auditory hallucinations.  Patient denies any visual hallucinations. Disorganized thinking is much less with current treatment.  The patient was motivated to continue taking medication with a goal of continued improvement in mental health.   The patient reports their target psychiatric symptoms of psychosis responded well to the psychiatric medications, and the patient reports overall benefit other psychiatric hospitalization. Supportive psychotherapy was provided to the patient. The patient also participated in regular group therapy while hospitalized. Coping skills, problem solving as well as relaxation therapies were also part of the unit programming.  Labs were reviewed with the patient, and abnormal results were discussed with the patient.  The patient is able to verbalize their individual safety plan to this provider.  # It is recommended to the patient to continue psychiatric medications as prescribed, after discharge from the hospital.    # It is recommended to the patient to follow up with your outpatient psychiatric provider and PCP.  # It was discussed with the patient, the impact of MUSHROOMS, alcohol, drugs, tobacco have been there overall psychiatric and medical wellbeing, and total  abstinence from substance use was recommended the patient.ed.  # Prescriptions provided or sent directly to preferred pharmacy at discharge. Patient agreeable to plan. Given opportunity to ask questions. Appears to feel comfortable with discharge.    # In the event of worsening symptoms, the patient is instructed to call the crisis hotline, 911 and or go to the nearest ED for appropriate evaluation and treatment of symptoms. To follow-up with primary care provider for other medical issues, concerns and or health care needs  # Patient was discharged to care of mother, with a plan to follow up as noted below.   Physical Findings: AIMS: Facial and Oral Movements Muscles of Facial Expression: None, normal Lips and Perioral Area: None, normal Jaw: None, normal Tongue: None, normal,Extremity Movements Upper (arms, wrists, hands, fingers): None, normal Lower (legs, knees, ankles, toes): None, normal, Trunk Movements Neck, shoulders, hips: None, normal, Overall Severity Severity of abnormal movements (highest score from questions above): None, normal Incapacitation due to abnormal movements: None, normal Patient's awareness of abnormal movements (rate only patient's report): No Awareness, Dental Status Current problems with teeth and/or dentures?: No Does patient usually wear dentures?: No  CIWA:    COWS:     Aims score zero on my exam. No eps on my exam.  Musculoskeletal: Strength & Muscle Tone: within normal limits Gait & Station: normal Patient leans: N/A   Psychiatric Specialty Exam:  Presentation  General  Appearance:  Appropriate for Environment; Casual; Fairly Groomed  Eye Contact: Good  Speech: Normal Rate  Speech Volume: Normal  Handedness: Right   Mood and Affect  Mood: Euthymic  Affect: Congruent; Full Range   Thought Process  Thought Processes: Linear  Descriptions of Associations:Intact  Orientation:Full (Time, Place and Person)  Thought  Content:Logical  History of Schizophrenia/Schizoaffective disorder:No  Duration of Psychotic Symptoms:Greater than six months  Hallucinations:Hallucinations: None  Ideas of Reference:None  Suicidal Thoughts:Suicidal Thoughts: No  Homicidal Thoughts:Homicidal Thoughts: No   Sensorium  Memory: Immediate Good; Recent Good; Remote Good  Judgment: Fair  Insight: Fair   Art therapist  Concentration: Fair  Attention Span: Fair  Recall: Good  Fund of Knowledge: Good  Language: Good   Psychomotor Activity  Psychomotor Activity: Psychomotor Activity: Normal AIMS Completed?: Yes (score zero)   Assets  Assets: Communication Skills; Desire for Improvement; Financial Resources/Insurance; Housing; Physical Health; Social Support; Vocational/Educational   Sleep  Sleep: Sleep: Fair Number of Hours of Sleep: 12.5    Physical Exam: Physical Exam Vitals reviewed.  Constitutional:      General: He is not in acute distress.    Appearance: He is not toxic-appearing.  Pulmonary:     Effort: Pulmonary effort is normal. No respiratory distress.  Neurological:     Mental Status: He is alert.     Motor: No weakness.     Gait: Gait normal.  Psychiatric:        Mood and Affect: Mood normal.        Behavior: Behavior normal.        Thought Content: Thought content normal.        Judgment: Judgment normal.    Review of Systems  Constitutional:  Negative for chills and fever.  Cardiovascular:  Negative for chest pain and palpitations.  Neurological:  Negative for dizziness, tingling, tremors and headaches.  Psychiatric/Behavioral:  Negative for depression, hallucinations, memory loss, substance abuse and suicidal ideas. The patient is not nervous/anxious and does not have insomnia.   All other systems reviewed and are negative.  Blood pressure 133/75, pulse (!) 101, temperature 98.6 F (37 C), resp. rate 16, height 6\' 5"  (1.956 m), weight (!) 171 kg, SpO2  99%. Body mass index is 44.71 kg/m.   Social History   Tobacco Use  Smoking Status Never  Smokeless Tobacco Never   Tobacco Cessation:  N/A, patient does not currently use tobacco products   Blood Alcohol level:  Lab Results  Component Value Date   ETH <10 04/18/2023   ETH <10 07/08/2021    Metabolic Disorder Labs:  Lab Results  Component Value Date   HGBA1C 5.4 04/22/2023   MPG 108 04/22/2023   Lab Results  Component Value Date   PROLACTIN 22.3 (H) 09/11/2022   Lab Results  Component Value Date   CHOL 126 04/22/2023   TRIG 113 04/22/2023   HDL 21 (L) 04/22/2023   CHOLHDL 6.0 04/22/2023   VLDL 23 04/22/2023   LDLCALC 82 04/22/2023    See Psychiatric Specialty Exam and Suicide Risk Assessment completed by Attending Physician prior to discharge.  Discharge destination:  Home  Is patient on multiple antipsychotic therapies at discharge:  Yes,   Do you recommend tapering to monotherapy for antipsychotics?  No   Has Patient had three or more failed trials of antipsychotic monotherapy by history:  Yes,   Antipsychotic medications that previously failed include:   1.  Risperdal monotherapy., 2.  zyprexa., and 3.  seroquel.  Recommended Plan for Multiple Antipsychotic Therapies: Additional reason(s) for multiple antispychotic treatment:  patient did not respond to multiple trials of dual antipsychotic therapy until thorazine was started and titrated.  Discharge Instructions     Diet - low sodium heart healthy   Complete by: As directed    Increase activity slowly   Complete by: As directed       Allergies as of 05/06/2023   No Known Allergies      Medication List     STOP taking these medications    acetaminophen 500 MG tablet Commonly known as: TYLENOL   clomiPHENE 50 MG tablet Commonly known as: CLOMID   ibuprofen 200 MG tablet Commonly known as: ADVIL   sertraline 50 MG tablet Commonly known as: ZOLOFT       TAKE these medications       Indication  albuterol 108 (90 Base) MCG/ACT inhaler Commonly known as: VENTOLIN HFA Inhale 2 puffs into the lungs every 6 (six) hours as needed for wheezing or shortness of breath.  Indication: Asthma   chlorproMAZINE 50 MG tablet Commonly known as: THORAZINE Take 3 tablets (150 mg total) by mouth at bedtime.  Indication: Schizophrenia   chlorproMAZINE 50 MG tablet Commonly known as: THORAZINE Take 1 tablet (50 mg total) by mouth 2 (two) times daily.  Indication: Schizophrenia   gabapentin 100 MG capsule Commonly known as: NEURONTIN Take 2 capsules (200 mg total) by mouth at bedtime.  Indication: Restless Leg Syndrome   hydrOXYzine 25 MG tablet Commonly known as: ATARAX Take 1 tablet (25 mg total) by mouth 3 (three) times daily as needed.  Indication: Feeling Anxious   loratadine 10 MG tablet Commonly known as: CLARITIN Take 1 tablet (10 mg total) by mouth daily. Start taking on: May 07, 2023  Indication: Hayfever   metFORMIN 500 MG 24 hr tablet Commonly known as: GLUCOPHAGE-XR Take 1 tablet (500 mg total) by mouth daily with breakfast. Start taking on: May 07, 2023  Indication: Body Weight Gain due to Antipsychotic Medication Use   nicotine polacrilex 2 MG gum Commonly known as: NICORETTE Take 1 each (2 mg total) by mouth as needed for smoking cessation.  Indication: Nicotine Addiction   polyethylene glycol 17 g packet Commonly known as: MIRALAX / GLYCOLAX Take 17 g by mouth daily as needed.  Indication: Constipation   risperidone 4 MG tablet Commonly known as: RISPERDAL Take 1 tablet (4 mg total) by mouth 2 (two) times daily. Start taking on: May 07, 2023 What changed:  medication strength how much to take how to take this when to take this additional instructions  Indication: Schizophrenia   traZODone 100 MG tablet Commonly known as: DESYREL Take 2 tablets (200 mg total) by mouth at bedtime as needed for sleep.  Indication: Trouble Sleeping         Follow-up Information     [facility] Follow up on 05/14/2023.   Why: You have an appointment for therapy services on 05/14/23 at 8:00 am.  This will be a Virtual appointment via the telephone. Contact information: [address]  [address] [place] [address] [phone]         [facility] Follow up on 05/18/2023.   Specialty: Behavioral Health Why: 05/18/23 at 11:30 am for medication management services Contact information: [address] [place] [address] [phone]                Follow-up recommendations:    Activity:  as tolerated  Diet: heart healthy  Other: -Follow-up with your outpatient psychiatric provider -instructions on appointment date, time, and address (location) are provided to you in discharge paperwork.  -Take your psychiatric medications as prescribed at discharge - instructions are provided to you in the discharge paperwork  -Follow-up with outpatient primary care doctor and other specialists -for management of preventative medicine and chronic medical disease  -Testing: Follow-up with outpatient provider for abnormal lab results:  **pt refusing lab work to follow-up and trend abnormal lab results below** 04-28-23: Ammonia 44 ALT 48  -If you are prescribed an atypical antipsychotic medication, we recommend that your outpatient psychiatrist follow routine screening for side effects within 3 months of discharge, including monitoring: AIMS scale, height, weight, blood pressure, fasting lipid panel, HbA1c, and fasting blood sugar.   **STOP USING MUSHROOMS**  -Recommend total abstinence from alcohol, tobacco, and other illicit drug use at discharge.   -If your psychiatric symptoms recur, worsen, or if you have side effects to your psychiatric medications, call your outpatient psychiatric provider, 911, [phone] or go to the nearest emergency department.  -If suicidal thoughts  occur, immediately call your outpatient psychiatric provider, 911, [phone] or go to the nearest emergency department.    Signed: Cristy Hilts, MD 05/06/2023, 9:58 AM  Total Time Spent in Direct Patient Care:  I personally spent 45 minutes on the unit in direct patient care. The direct patient care time included face-to-face time with the patient, reviewing the patient's chart, communicating with other professionals, and coordinating care. Greater than 50% of this time was spent in counseling or coordinating care with the patient regarding goals of hospitalization, psycho-education, and discharge planning needs.   Phineas Inches, MD Psychiatrist

## 2023-05-06 NOTE — [facility] Suicide Risk Assessment (Signed)
[facility] Discharge Suicide Risk Assessment   Principal Problem: Schizophrenia [facility]) Discharge Diagnoses: Principal Problem:   Schizophrenia (HCC)   Total Time spent with patient: 20 minutes  Matthew Padilla is a 36 y M with past psychiatric history of schizophrenia, paranoid type and past medical history of hypogonadism. He presented to [facility] ED for tachycardia and found to have mild rhabdomyolysis. Patient was medically stabilized and admitted to inpatient [facility] under IVC for possible exacerbation of schizophrenia.   During the patient's hospitalization, patient had extensive initial psychiatric evaluation, and follow-up psychiatric evaluations every day.   Psychiatric diagnoses provided upon initial assessment:  Schizophrenia Hallucinogenic use disorder   Patient's psychiatric medications were adjusted on admission:    -- Start Risperdal m-tabs, 2 mg oral dissolvable tablets BID for acute psychosis    -- Continue Propranolol 40 mg TID for tachycardia, blood pressure, anxiolysis.   During the hospitalization, other adjustments were made to the patient's psychiatric medication regimen:  -risperdal was increased to 4 mg bid  -zyprexa was started but stopped due to ineffectiveness -seroquel was started but stopped due to ineffectiveness -thorazine was started and increased to 50 mg qam, 50 mg qpm, and 150 mg at bedtime  -gabapentin 200 mg at bedtime was started for restless legs  -metformin was started to mitigate antipsychotic metabolic changes  -Propranolol was dc due to asthma -albuterol was started   Patient's care was discussed during the interdisciplinary team meeting every day during the hospitalization.   The patient denied having side effects to prescribed psychiatric medication.   Gradually, patient started adjusting to milieu. The patient was evaluated each day by a clinical provider to ascertain response to treatment. Improvement was noted by the patient's report of  decreasing symptoms, improved sleep and appetite, affect, medication tolerance, behavior, and participation in unit programming.  Patient was asked each day to complete a self inventory noting mood, mental status, pain, new symptoms, anxiety and concerns.     Symptoms were reported as significantly decreased or resolved completely by discharge.    On day of discharge, the patient reports that their mood is stable. The patient denied having suicidal thoughts for more than 48 hours prior to discharge.  Patient denies having homicidal thoughts.  Patient denies having auditory hallucinations.  Patient denies any visual hallucinations. Disorganized thinking is much less with current treatment.  The patient was motivated to continue taking medication with a goal of continued improvement in mental health.    The patient reports their target psychiatric symptoms of psychosis responded well to the psychiatric medications, and the patient reports overall benefit other psychiatric hospitalization. Supportive psychotherapy was provided to the patient. The patient also participated in regular group therapy while hospitalized. Coping skills, problem solving as well as relaxation therapies were also part of the unit programming.   Labs were reviewed with the patient, and abnormal results were discussed with the patient.   The patient is able to verbalize their individual safety plan to this provider.   # It is recommended to the patient to continue psychiatric medications as prescribed, after discharge from the hospital.     # It is recommended to the patient to follow up with your outpatient psychiatric provider and PCP.   # It was discussed with the patient, the impact of MUSHROOMS, alcohol, drugs, tobacco have been there overall psychiatric and medical wellbeing, and total abstinence from substance use was recommended the patient.ed.   # Prescriptions provided or sent directly to preferred pharmacy at  discharge. Patient agreeable to plan. Given opportunity to ask questions. Appears to feel comfortable with discharge.    # In the event of worsening symptoms, the patient is instructed to call the crisis hotline, 911 and or go to the nearest ED for appropriate evaluation and treatment of symptoms. To follow-up with primary care provider for other medical issues, concerns and or health care needs   # Patient was discharged to care of mother, with a plan to follow up as noted below.     Psychiatric Specialty Exam  Presentation  General Appearance:  Appropriate for Environment; Casual; Fairly Groomed  Eye Contact: Good  Speech: Normal Rate  Speech Volume: Normal  Handedness: Right   Mood and Affect  Mood: Euthymic  Duration of Depression Symptoms: Greater than two weeks  Affect: Congruent; Full Range   Thought Process  Thought Processes: Linear  Descriptions of Associations:Intact  Orientation:Full (Time, Place and Person)  Thought Content:Logical  History of Schizophrenia/Schizoaffective disorder:No  Duration of Psychotic Symptoms:Greater than six months  Hallucinations:Hallucinations: None  Ideas of Reference:None  Suicidal Thoughts:Suicidal Thoughts: No  Homicidal Thoughts:Homicidal Thoughts: No   Sensorium  Memory: Immediate Good; Recent Good; Remote Good  Judgment: Fair  Insight: Fair   Art therapist  Concentration: Fair  Attention Span: Fair  Recall: Good  Fund of Knowledge: Good  Language: Good   Psychomotor Activity  Psychomotor Activity: Psychomotor Activity: Normal AIMS Completed?: Yes (score zero)   Assets  Assets: Communication Skills; Desire for Improvement; Financial Resources/Insurance; Housing; Physical Health; Social Support; Vocational/Educational   Sleep  Sleep: Sleep: Fair Number of Hours of Sleep: 12.5   Physical Exam: Physical Exam  See discharge summary  ROS See discharge  summary  Blood pressure 133/75, pulse (!) 101, temperature 98.6 F (37 C), resp. rate 16, height 6\' 5"  (1.956 m), weight (!) 171 kg, SpO2 99%. Body mass index is 44.71 kg/m.  Mental Status Per Nursing Assessment::   On Admission:  NA  Demographic Factors:  Male, Adolescent or young adult, and Caucasian  Loss Factors: NA  Historical Factors: Impulsivity  Risk Reduction Factors:   Living with another person, especially a relative, Positive social support, Positive therapeutic relationship, and Positive coping skills or problem solving skills  Continued Clinical Symptoms:  PSYCHOSIS AT BASELINE. DENYING SI, HI.     Cognitive Features That Contribute To Risk:  NONE  Suicide Risk:  Mild:  There are no identifiable suicide plans, no associated intent, mild dysphoria and related symptoms, good self-control (both objective and subjective assessment), few other risk factors, and identifiable protective factors, including available and accessible social support.    Follow-up Information     [facility] Follow up on 05/14/2023.   Why: You have an appointment for therapy services on 05/14/23 at 8:00 am.  This will be a Virtual appointment via the telephone. Contact information: [address]  [address] [place] [address] [phone]         [facility] Follow up on 05/18/2023.   Specialty: Behavioral Health Why: 05/18/23 at 11:30 am for medication management services Contact information: [address] [place] [address] [phone]                Plan Of Care/Follow-up recommendations:   -Follow-up with your outpatient psychiatric provider -instructions on appointment date, time, and address (location) are provided to you in discharge paperwork.   -Take your psychiatric medications as prescribed at discharge - instructions are provided to you  in the discharge paperwork   -Follow-up with outpatient  primary care doctor and other specialists -for management of preventative medicine and chronic medical disease   -Testing: Follow-up with outpatient provider for abnormal lab results:  **pt refusing lab work to follow-up and trend abnormal lab results below** 04-28-23: Ammonia 44 ALT 48   -If you are prescribed an atypical antipsychotic medication, we recommend that your outpatient psychiatrist follow routine screening for side effects within 3 months of discharge, including monitoring: AIMS scale, height, weight, blood pressure, fasting lipid panel, HbA1c, and fasting blood sugar.    **STOP USING MUSHROOMS**   -Recommend total abstinence from alcohol, tobacco, and other illicit drug use at discharge.    -If your psychiatric symptoms recur, worsen, or if you have side effects to your psychiatric medications, call your outpatient psychiatric provider, 911, [phone] or go to the nearest emergency department.   -If suicidal thoughts occur, immediately call your outpatient psychiatric provider, 911, [phone] or go to the nearest emergency department.    Cristy Hilts, MD 05/06/2023, 10:13 AM

## 2023-05-12 ENCOUNTER — Telehealth (HOSPITAL_COMMUNITY): Payer: Self-pay | Admitting: *Deleted

## 2023-05-12 NOTE — Telephone Encounter (Signed)
Received VM from pt's mother, Chanel Hovey, requesting refill of one of the Thorazine scripts that were sent tp pharmacy by upon d/c from [facility] on 05/06/23. Writer spoke with pt pharmacy who verified all meds were picked up on 05/06/23. Writer returned [name]'s call but had to LVM asking if she is requesting a bridge until his next appointment with Dr. Lolly Mustache on 06/02/23-which pt should not need. Mother advised to call back with any further questions or concerns.

## 2023-05-13 NOTE — Telephone Encounter (Signed)
Spoke with mother [name] to advise I had spoken with the pharmacy and she was going to go to pharmacy with all the medications to figure out what's going on with pharmacy. [name] was to call this nurse with any further issues. Pt should have enough medication to get to next appointment on 06/02/23. FYI.

## 2023-05-13 NOTE — Telephone Encounter (Signed)
Okay to [employer],

## 2023-05-18 ENCOUNTER — Telehealth (HOSPITAL_COMMUNITY): Payer: 59 | Admitting: Psychiatry

## 2023-05-19 ENCOUNTER — Other Ambulatory Visit (HOSPITAL_COMMUNITY): Payer: Self-pay | Admitting: *Deleted

## 2023-05-19 MED ORDER — CHLORPROMAZINE HCL 50 MG PO TABS: 150.0000 mg | ORAL_TABLET | Freq: Every day | ORAL | 0 refills | Status: DC

## 2023-05-24 ENCOUNTER — Encounter: Payer: Self-pay | Admitting: Family Medicine

## 2023-05-24 ENCOUNTER — Ambulatory Visit (INDEPENDENT_AMBULATORY_CARE_PROVIDER_SITE_OTHER): Payer: 59 | Admitting: Family Medicine

## 2023-05-24 DIAGNOSIS — J302 Other seasonal allergic rhinitis: Secondary | ICD-10-CM

## 2023-05-24 DIAGNOSIS — E291 Testicular hypofunction: Secondary | ICD-10-CM | POA: Diagnosis not present

## 2023-05-24 DIAGNOSIS — J45909 Unspecified asthma, uncomplicated: Secondary | ICD-10-CM | POA: Insufficient documentation

## 2023-05-24 DIAGNOSIS — Z6841 Body Mass Index (BMI) 40.0 and over, adult: Secondary | ICD-10-CM

## 2023-05-24 DIAGNOSIS — F200 Paranoid schizophrenia: Secondary | ICD-10-CM | POA: Diagnosis not present

## 2023-05-24 DIAGNOSIS — G4700 Insomnia, unspecified: Secondary | ICD-10-CM

## 2023-05-24 DIAGNOSIS — J4520 Mild intermittent asthma, uncomplicated: Secondary | ICD-10-CM

## 2023-05-24 DIAGNOSIS — F419 Anxiety disorder, unspecified: Secondary | ICD-10-CM | POA: Insufficient documentation

## 2023-05-24 NOTE — Patient Instructions (Signed)
It was very nice to see you today!  I will refer you to see the nutritionist.  Please continue to work on diet and exercise.  Return in about 1 year (around 05/23/2024) for Annual Physical.   Take care, Dr Jimmey Ralph  PLEASE NOTE:  If you had any lab tests, please let us know if you have not heard back within a few days. You may see your results on mychart before we have a chance to review them but we will give you a call once they are reviewed by [place].   If we ordered any referrals today, please let us know if you have not heard from their office within the next week.   If you had any urgent prescriptions sent in today, please check with the pharmacy within an hour of our visit to make sure the prescription was transmitted appropriately.   Please try these tips to maintain a healthy lifestyle:  Eat at least 3 REAL meals and 1-2 snacks per day.  Aim for no more than 5 hours between eating.  If you eat breakfast, please do so within one hour of getting up.   Each meal should contain half fruits/vegetables, one quarter protein, and one quarter carbs (no bigger than a computer mouse)  Cut down on sweet beverages. This includes juice, soda, and sweet tea.   Drink at least 1 glass of water with each meal and aim for at least 8 glasses per day  Exercise at least 150 minutes every week.

## 2023-05-24 NOTE — Progress Notes (Signed)
Matthew Padilla is a 27 y.o. male who presents today for an office visit.  He is a new patient.   Assessment/Plan:  Chronic Problems Addressed Today: Schizophrenia, paranoid [facility]) Following with psychiatry.  Symptoms are now stable.  He is on Thorazine 50 mg in the morning, 50 mg in the afternoon, and 150 mg at bedtime.  Also on Risperdal 4 mg daily.  Hypogonadism in male Following with endocrinology.  Did not have much improvement with Clomid.  They will go back soon for repeat testing.  Morbid obesity (HCC) He is working on diet and exercise and has lost quite a few pounds over the last few weeks.  They are interested in seeing a nutritionist.  Will place referral today.  We can recheck labs in 6 to 12 months.  May benefit from GLP-1 agonist if does not have adequate response to lifestyle interventions.  Insomnia Stable on trazodone 200 mg nightly.  Asthma Stable on asthma as needed.  No recent flares.  Seasonal allergies Mild flare recently.  Recommend over-the-counter Claritin.  Can also use Flonase if needed.  Anxiety Following with psychiatry.  On hydroxyzine 25 mg 3 times daily as needed.  Preventative health care Due for HPV and tetanus vaccines today.  They deferred for now.  Obtain records from previous PCP.  Follow-up in 1 year for annual physical.j    Subjective:  HPI:  See A/P for status of chronic conditions.  Patient is here to establish care today.  Medical history is significant for paranoid schizophrenia for which he follows with psychiatry for.  His current regimen includes Thorazine 50 mg in the morning and 200 mg at night, hydroxyzine 25 mg 3 times daily, Risperdal 4 mg twice daily, and trazodone 200 mg nightly for insomnia.  They did start him on gabapentin however he discontinued this.  He is currently managing his restless legs with magnesium.  He has additionally also started on metformin to help with metabolic changes.  He has been tolerating well.  He  was additionally recently started on metformin to help mitigate antipsychotic metabolic changes.  He is currently following with endocrinology for low testosterone and was previously on Clomid without any improvement.  They had been planning on doing repeat blood work however this was postponed due to his recent hospitalization due to schizophrenia.  He is not sure if the Clomid helped.  Today he feels like he is doing well.  He is having mild constipation with medications but this does seem to be manageable.  Mental health is in a much better place than it was several weeks ago.  ROS: Per HPI, otherwise a complete review of systems was negative.   PMH:  The following were reviewed and entered/updated in epic: Past Medical History:  Diagnosis Date   Anxiety    Low testosterone in male    Paranoid schizophrenia [facility])    Patient Active Problem List   Diagnosis Date Noted   Morbid obesity (HCC) 05/24/2023   Insomnia 05/24/2023   Asthma 05/24/2023   Seasonal allergies 05/24/2023   Anxiety 05/24/2023   Schizophrenia, paranoid (HCC) 04/18/2023   Hypogonadism in male 01/15/2021   History reviewed. No pertinent surgical history.  History reviewed. No pertinent family history.  Medications- reviewed and updated Current Outpatient Medications  Medication Sig Dispense Refill   albuterol (VENTOLIN HFA) 108 (90 Base) MCG/ACT inhaler Inhale 2 puffs into the lungs every 6 (six) hours as needed for wheezing or shortness of breath. 1 each 0  chlorproMAZINE (THORAZINE) 50 MG tablet Take 1 tablet (50 mg total) by mouth 2 (two) times daily. 60 tablet 0   hydrOXYzine (ATARAX) 25 MG tablet Take 1 tablet (25 mg total) by mouth 3 (three) times daily as needed. 30 tablet 0   loratadine (CLARITIN) 10 MG tablet Take 1 tablet (10 mg total) by mouth daily.     metFORMIN (GLUCOPHAGE-XR) 500 MG 24 hr tablet Take 1 tablet (500 mg total) by mouth daily with breakfast. 30 tablet 0   polyethylene glycol  (MIRALAX / GLYCOLAX) 17 g packet Take 17 g by mouth daily as needed. 14 each 0   risperiDONE (RISPERDAL) 4 MG tablet Take 1 tablet (4 mg total) by mouth 2 (two) times daily. 60 tablet 0   traZODone (DESYREL) 100 MG tablet Take 2 tablets (200 mg total) by mouth at bedtime as needed for sleep. 30 tablet 0   chlorproMAZINE (THORAZINE) 50 MG tablet Take 3 tablets (150 mg total) by mouth at bedtime. (Patient not taking: Reported on 05/24/2023) 90 tablet 0   No current facility-administered medications for this visit.    Allergies-reviewed and updated No Known Allergies  Social History   Socioeconomic History   Marital status: Single    Spouse name: Not on file   Number of children: Not on file   Years of education: Not on file   Highest education level: Not on file  Occupational History   Not on file  Tobacco Use   Smoking status: Never   Smokeless tobacco: Never  Substance and Sexual Activity   Alcohol use: Not Currently   Drug use: Yes    Types: Other-see comments   Sexual activity: Never  Other Topics Concern   Not on file  Social History Narrative   Not on file   Social Determinants of Health   Financial Resource Strain: Not on file  Food Insecurity: Patient Declined (04/19/2023)   Hunger Vital Sign    Worried About Running Out of Food in the Last Year: Patient declined    Ran Out of Food in the Last Year: Patient declined  Transportation Needs: Patient Declined (04/19/2023)   PRAPARE - Administrator, Civil Service (Medical): Patient declined    Lack of Transportation (Non-Medical): Patient declined  Physical Activity: Not on file  Stress: Not on file  Social Connections: Not on file           Objective:  Physical Exam: BP 126/84   Pulse (!) 114   Temp (!) 97.5 F (36.4 C) (Temporal)   Ht 6\' 5"  (1.956 m)   Wt (!) 371 lb (168.3 kg)   SpO2 96%   BMI 43.99 kg/m   Gen: No acute distress, resting comfortably CV: Regular rate and rhythm with no  murmurs appreciated Pulm: Normal work of breathing, clear to auscultation bilaterally with no crackles, wheezes, or rhonchi Neuro: Grossly normal, moves all extremities Psych: Normal affect and thought content      Samon Dishner M. Jimmey Ralph, MD 05/24/2023 10:30 AM

## 2023-05-24 NOTE — Assessment & Plan Note (Signed)
Stable on trazodone 200 mg nightly.

## 2023-05-24 NOTE — Assessment & Plan Note (Signed)
Mild flare recently.  Recommend over-the-counter Claritin.  Can also use Flonase if needed.

## 2023-05-24 NOTE — Assessment & Plan Note (Signed)
Following with endocrinology.  Did not have much improvement with Clomid.  They will go back soon for repeat testing.

## 2023-05-24 NOTE — Assessment & Plan Note (Signed)
Following with psychiatry.  On hydroxyzine 25 mg 3 times daily as needed.

## 2023-05-24 NOTE — Assessment & Plan Note (Signed)
Stable on asthma as needed.  No recent flares.

## 2023-05-24 NOTE — Assessment & Plan Note (Signed)
Following with psychiatry.  Symptoms are now stable.  He is on Thorazine 50 mg in the morning, 50 mg in the afternoon, and 150 mg at bedtime.  Also on Risperdal 4 mg daily.

## 2023-05-24 NOTE — Assessment & Plan Note (Signed)
He is working on diet and exercise and has lost quite a few pounds over the last few weeks.  They are interested in seeing a nutritionist.  Will place referral today.  We can recheck labs in 6 to 12 months.  May benefit from GLP-1 agonist if does not have adequate response to lifestyle interventions.

## 2023-06-02 ENCOUNTER — Telehealth (HOSPITAL_BASED_OUTPATIENT_CLINIC_OR_DEPARTMENT_OTHER): Payer: 59 | Admitting: Psychiatry

## 2023-06-02 ENCOUNTER — Encounter (HOSPITAL_COMMUNITY): Payer: Self-pay | Admitting: Psychiatry

## 2023-06-02 VITALS — Wt 357.0 lb

## 2023-06-02 DIAGNOSIS — F1210 Cannabis abuse, uncomplicated: Secondary | ICD-10-CM

## 2023-06-02 DIAGNOSIS — F200 Paranoid schizophrenia: Secondary | ICD-10-CM

## 2023-06-02 DIAGNOSIS — F419 Anxiety disorder, unspecified: Secondary | ICD-10-CM | POA: Diagnosis not present

## 2023-06-02 MED ORDER — CHLORPROMAZINE HCL 50 MG PO TABS
50.0000 mg | ORAL_TABLET | Freq: Two times a day (BID) | ORAL | 0 refills | Status: AC
Start: 2023-06-02 — End: 2023-07-02

## 2023-06-02 MED ORDER — METFORMIN HCL ER 500 MG PO TB24: 500.0000 mg | ORAL_TABLET | Freq: Every day | ORAL | 0 refills | Status: DC

## 2023-06-02 MED ORDER — RISPERIDONE 4 MG PO TABS: 4.0000 mg | ORAL_TABLET | Freq: Two times a day (BID) | ORAL | 0 refills | Status: DC

## 2023-06-02 MED ORDER — CHLORPROMAZINE HCL 100 MG PO TABS
100.0000 mg | ORAL_TABLET | Freq: Every day | ORAL | 0 refills | Status: AC
Start: 2023-06-02 — End: 2023-07-02

## 2023-06-02 NOTE — Progress Notes (Signed)
[facility] MD Virtual Progress Note   Patient Location: Home Provider Location: Home Office  I connect with patient by video and verified that I am speaking with correct person by using two identifiers. I discussed the limitations of evaluation and management by telemedicine and the availability of in person appointments. I also discussed with the patient that there may be a patient responsible charge related to this service. The patient expressed understanding and agreed to proceed.  Matthew Padilla [id number] 27 y.o.  06/02/2023 1:03 PM  History of Present Illness:  Patient is evaluated by video session.  His mother Matthew Padilla was also present during the session.  Patient was admitted to behavioral health from July 15-August 1.  His mother reported that he stopped taking his medication and not doing well.  He had a manic episode.  He was spending money, using Gummies and 2 days before inpatient he consumed a large amount of mushroom.  His CPK was very high and he was admitted on the medical floor initially and then later transferred to inpatient.  He admitted not taking the respite on at that time.  His medicines were changed.  He initially tried olanzapine and then Seroquel but that did not help.  His risperidone was increased to 4 mg twice a day and also Thorazine was added to help psychosis and mania.  Now he is taking 8 mg of Risperdal and Thorazine 50 mg twice a day and 150 mg at bedtime.  He was also prescribed gabapentin for restless leg but he is not taking it.  He is prescribed metformin to lose weight and he had lost significant weight since the discharge.  He is no longer using Gummies.  He is no longer using mushrooms.  He is not able to do the school and taking a leave and also he is out of his work for now as not able to function.  His mother is very concerned about the brain fog and memory issues.  He admitted to still feels sometimes paranoia and trust issues but he feels  tired and groggy.  He does not have suicidal thoughts.  He is living with his parents and recently had a beach trip.  His mother tried to take him for grocery store and he does go but does not go by himself.  He started therapy with Matthew Padilla at [place] and so far he has only 1 session which he believed did not go well.  But he has appointment coming up in the future.  He is no longer taking Zoloft.  His last CPK is much better.  Denies any aggression, violence, recent manic episodes but he had spent a lot of money from his bank account buying mushrooms and guns.  His parents are very involved especially mother who is checking his medication.  She is wondering if dose can be reduced but also like not to be decompensated.  Past Psychiatric History: H/O schizophrenia.  Inpatient at [facility] in July 2024 due to decompensation and n/c with meds, using mushroom. D/C on Thorazine, Risperdal, Gabapentin and Zoloft was discontinued. Olanzapine and Seroquel was ineffective. H/O inpatient in 2019 at [facility] after having suicidal thoughts to crash his car but no intent.  D/C on REXULTI and Wellbutrin but stopped after 4 weeks as no changed. H/O inpatient in October 2022 at [facility] for bizarre behavior and psychosis.  Given Haldol but stopped due to excessive drooling and sedation.  H/O mania nd Psychosis.  Outpatient Encounter Medications as of 06/02/2023  Medication Sig   albuterol (VENTOLIN HFA) 108 (90 Base) MCG/ACT inhaler Inhale 2 puffs into the lungs every 6 (six) hours as needed for wheezing or shortness of breath.   chlorproMAZINE (THORAZINE) 50 MG tablet Take 1 tablet (50 mg total) by mouth 2 (two) times daily.   chlorproMAZINE (THORAZINE) 50 MG tablet Take 3 tablets (150 mg total) by mouth at bedtime. (Patient not taking: Reported on 05/24/2023)   hydrOXYzine (ATARAX) 25 MG tablet Take 1 tablet (25 mg total) by mouth 3 (three) times daily as needed.   loratadine (CLARITIN) 10 MG tablet  Take 1 tablet (10 mg total) by mouth daily.   metFORMIN (GLUCOPHAGE-XR) 500 MG 24 hr tablet Take 1 tablet (500 mg total) by mouth daily with breakfast.   polyethylene glycol (MIRALAX / GLYCOLAX) 17 g packet Take 17 g by mouth daily as needed.   risperiDONE (RISPERDAL) 4 MG tablet Take 1 tablet (4 mg total) by mouth 2 (two) times daily.   traZODone (DESYREL) 100 MG tablet Take 2 tablets (200 mg total) by mouth at bedtime as needed for sleep.   No facility-administered encounter medications on file as of 06/02/2023.    Recent Results (from the past 2160 hour(s))  CBC with Differential     Status: Abnormal   Collection Time: 04/18/23  3:58 AM  Result Value Ref Range   WBC 8.7 4.0 - 10.5 K/uL   RBC 5.74 4.22 - 5.81 MIL/uL   Hemoglobin 13.9 13.0 - 17.0 g/dL   HCT 16.1 09.6 - 04.5 %   MCV 77.9 (L) 80.0 - 100.0 fL   MCH 24.2 (L) 26.0 - 34.0 pg   MCHC 31.1 30.0 - 36.0 g/dL   RDW 40.9 (H) 81.1 - 91.4 %   Platelets 242 150 - 400 K/uL   nRBC 0.0 0.0 - 0.2 %   Neutrophils Relative % 50 %   Neutro Abs 4.4 1.7 - 7.7 K/uL   Lymphocytes Relative 39 %   Lymphs Abs 3.3 0.7 - 4.0 K/uL   Monocytes Relative 9 %   Monocytes Absolute 0.8 0.1 - 1.0 K/uL   Eosinophils Relative 1 %   Eosinophils Absolute 0.1 0.0 - 0.5 K/uL   Basophils Relative 1 %   Basophils Absolute 0.0 0.0 - 0.1 K/uL   Immature Granulocytes 0 %   Abs Immature Granulocytes 0.03 0.00 - 0.07 K/uL    Comment: Performed at [facility], [address], [place], [place] [address]  Comprehensive metabolic panel     Status: Abnormal   Collection Time: 04/18/23  3:58 AM  Result Value Ref Range   Sodium 137 135 - 145 mmol/L   Potassium 3.6 3.5 - 5.1 mmol/L   Chloride 105 98 - 111 mmol/L   CO2 23 22 - 32 mmol/L   Glucose, Bld 115 (H) 70 - 99 mg/dL    Comment: Glucose reference range applies only to samples taken after fasting for at least 8 hours.   BUN 16 6 - 20 mg/dL   Creatinine, Ser 6.21 0.61 - 1.24 mg/dL   Calcium 8.8 (L) 8.9  - 10.3 mg/dL   Total Protein 6.6 6.5 - 8.1 g/dL   Albumin 3.8 3.5 - 5.0 g/dL   AST 51 (H) 15 - 41 U/L   ALT 44 0 - 44 U/L   Alkaline Phosphatase 47 38 - 126 U/L   Total Bilirubin 0.7 0.3 - 1.2 mg/dL   GFR, Estimated >30 >86  mL/min    Comment: (NOTE) Calculated using the CKD-EPI Creatinine Equation (2021)    Anion gap 9 5 - 15    Comment: Performed at [facility], [address], [place], [place] [address]  Troponin I (High Sensitivity)     Status: Abnormal   Collection Time: 04/18/23  3:58 AM  Result Value Ref Range   Troponin I (High Sensitivity) 19 (H) <18 ng/L    Comment: (NOTE) Elevated high sensitivity troponin I (hsTnI) values and significant  changes across serial measurements may suggest ACS but many other  chronic and acute conditions are known to elevate hsTnI results.  Refer to the "Links" section for chest pain algorithms and additional  guidance. Performed at [facility], [address] [place], [place] [address]   CK     Status: Abnormal   Collection Time: 04/18/23  3:58 AM  Result Value Ref Range   Total CK 1,996 (H) 49 - 397 U/L    Comment: Performed at [facility], [address], [place], [place] [address]  Urinalysis, Routine w reflex microscopic -Urine, Clean Catch     Status: Abnormal   Collection Time: 04/18/23  4:43 AM  Result Value Ref Range   Color, Urine STRAW (A) YELLOW   APPearance CLEAR CLEAR   Specific Gravity, Urine 1.005 1.005 - 1.030   pH 6.0 5.0 - 8.0   Glucose, UA NEGATIVE NEGATIVE mg/dL   Hgb urine dipstick NEGATIVE NEGATIVE   Bilirubin Urine NEGATIVE NEGATIVE   Ketones, ur NEGATIVE NEGATIVE mg/dL   Protein, ur NEGATIVE NEGATIVE mg/dL   Nitrite NEGATIVE NEGATIVE   Leukocytes,Ua NEGATIVE NEGATIVE    Comment: Performed at [facility], [address] [place], [place] [address]  Urine rapid drug screen (hosp performed)     Status: Abnormal   Collection Time: 04/18/23  4:43 AM  Result Value Ref Range    Opiates NONE DETECTED NONE DETECTED   Cocaine NONE DETECTED NONE DETECTED   Benzodiazepines NONE DETECTED NONE DETECTED   Amphetamines NONE DETECTED NONE DETECTED   Tetrahydrocannabinol POSITIVE (A) NONE DETECTED   Barbiturates NONE DETECTED NONE DETECTED    Comment: (NOTE) DRUG SCREEN FOR MEDICAL PURPOSES ONLY.  IF CONFIRMATION IS NEEDED FOR ANY PURPOSE, NOTIFY LAB WITHIN 5 DAYS.  LOWEST DETECTABLE LIMITS FOR URINE DRUG SCREEN Drug Class                     Cutoff (ng/mL) Amphetamine and metabolites    1000 Barbiturate and metabolites    200 Benzodiazepine                 200 Opiates and metabolites        300 Cocaine and metabolites        300 THC                            50 Performed at [facility], [address], [place], [place] [address]   Ethanol     Status: None   Collection Time: 04/18/23  5:36 AM  Result Value Ref Range   Alcohol, Ethyl (B) <10 <10 mg/dL    Comment: (NOTE) Lowest detectable limit for serum alcohol is 10 mg/dL.  For medical purposes only. Performed at [facility], [address] [place], [place] [address]   Troponin I (High Sensitivity)     Status: Abnormal   Collection  Time: 04/18/23  5:36 AM  Result Value Ref Range   Troponin I (High Sensitivity) 18 (H) <18 ng/L    Comment: (NOTE) Elevated high sensitivity troponin I (hsTnI) values and significant  changes across serial measurements may suggest ACS but many other  chronic and acute conditions are known to elevate hsTnI results.  Refer to the "Links" section for chest pain algorithms and additional  guidance. Performed at [facility], [address] [place], [place] [address]   CK     Status: Abnormal   Collection Time: 04/18/23 10:54 PM  Result Value Ref Range   Total CK 1,183 (H) 49 - 397 U/L    Comment: Performed at [facility], [address], [place], [place] [address]  CK     Status: Abnormal   Collection Time: 04/22/23  7:16 PM  Result Value Ref  Range   Total CK 937 (H) 49 - 397 U/L    Comment: Performed at [facility], [address] [place], [place] [address]  Lipid panel     Status: Abnormal   Collection Time: 04/22/23  7:16 PM  Result Value Ref Range   Cholesterol 126 0 - 200 mg/dL   Triglycerides 034 <742 mg/dL   HDL 21 (L) >59 mg/dL   Total CHOL/HDL Ratio 6.0 RATIO   VLDL 23 0 - 40 mg/dL   LDL Cholesterol 82 0 - 99 mg/dL    Comment:        Total Cholesterol/HDL:CHD Risk Coronary Heart Disease Risk Table                     Men   Women  1/2 Average Risk   3.4   3.3  Average Risk       5.0   4.4  2 X Average Risk   9.6   7.1  3 X Average Risk  23.4   11.0        Use the calculated Patient Ratio above and the CHD Risk Table to determine the patient's CHD Risk.        ATP III CLASSIFICATION (LDL):  <100     mg/dL   Optimal  563-875  mg/dL   Near or Above                    Optimal  130-159  mg/dL   Borderline  643-329  mg/dL   High  >518     mg/dL   Very High Performed at [facility], [address], [place], [place] [address]   Hemoglobin A1c     Status: None   Collection Time: 04/22/23  7:16 PM  Result Value Ref Range   Hgb A1c MFr Bld 5.4 4.8 - 5.6 %    Comment: (NOTE)         Prediabetes: 5.7 - 6.4         Diabetes: >6.4         Glycemic control for adults with diabetes: <7.0    Mean Plasma Glucose 108 mg/dL    Comment: (NOTE) Performed At: [facility] [address] [place], [place] [id number] Jolene Schimke MD [id number]   Comprehensive metabolic panel     Status: Abnormal   Collection Time: 04/22/23  7:16 PM  Result Value Ref Range   Sodium 132 (L) 135 - 145 mmol/L   Potassium 3.4 (L) 3.5 - 5.1 mmol/L   Chloride 103 98 - 111 mmol/L  CO2 19 (L) 22 - 32 mmol/L   Glucose, Bld 115 (H) 70 - 99 mg/dL    Comment: Glucose reference range applies only to samples taken after fasting for at least 8 hours.   BUN 14 6 - 20 mg/dL   Creatinine, Ser  6.57 0.61 - 1.24 mg/dL   Calcium 8.3 (L) 8.9 - 10.3 mg/dL   Total Protein 6.9 6.5 - 8.1 g/dL   Albumin 4.1 3.5 - 5.0 g/dL   AST 53 (H) 15 - 41 U/L   ALT 59 (H) 0 - 44 U/L   Alkaline Phosphatase 50 38 - 126 U/L   Total Bilirubin 1.0 0.3 - 1.2 mg/dL   GFR, Estimated >84 >69 mL/min    Comment: (NOTE) Calculated using the CKD-EPI Creatinine Equation (2021)    Anion gap 10 5 - 15    Comment: Performed at [facility], [address], [place], [place] [address]  CBC with Differential/Platelet     Status: Abnormal   Collection Time: 04/28/23  6:48 AM  Result Value Ref Range   WBC 5.8 4.0 - 10.5 K/uL   RBC 5.55 4.22 - 5.81 MIL/uL   Hemoglobin 13.6 13.0 - 17.0 g/dL   HCT 84.1 32.4 - 40.1 %   MCV 77.8 (L) 80.0 - 100.0 fL   MCH 24.5 (L) 26.0 - 34.0 pg   MCHC 31.5 30.0 - 36.0 g/dL   RDW 02.7 (H) 25.3 - 66.4 %   Platelets 212 150 - 400 K/uL   nRBC 0.0 0.0 - 0.2 %   Neutrophils Relative % 48 %   Neutro Abs 2.8 1.7 - 7.7 K/uL   Lymphocytes Relative 41 %   Lymphs Abs 2.4 0.7 - 4.0 K/uL   Monocytes Relative 9 %   Monocytes Absolute 0.5 0.1 - 1.0 K/uL   Eosinophils Relative 1 %   Eosinophils Absolute 0.1 0.0 - 0.5 K/uL   Basophils Relative 1 %   Basophils Absolute 0.0 0.0 - 0.1 K/uL   Immature Granulocytes 0 %   Abs Immature Granulocytes 0.02 0.00 - 0.07 K/uL    Comment: Performed at [facility], [address], [place], [place] [address]  CK     Status: None   Collection Time: 04/28/23  6:48 AM  Result Value Ref Range   Total CK 223 49 - 397 U/L    Comment: Performed at [facility], [address] [place], [place] [address]  Comprehensive metabolic panel     Status: Abnormal   Collection Time: 04/28/23  6:48 AM  Result Value Ref Range   Sodium 137 135 - 145 mmol/L   Potassium 4.0 3.5 - 5.1 mmol/L   Chloride 107 98 - 111 mmol/L   CO2 19 (L) 22 - 32 mmol/L   Glucose, Bld 91 70 - 99 mg/dL    Comment: Glucose reference range  applies only to samples taken after fasting for at least 8 hours.   BUN 10 6 - 20 mg/dL   Creatinine, Ser 6.38 0.61 - 1.24 mg/dL   Calcium 8.9 8.9 - 75.6 mg/dL   Total Protein 6.5 6.5 - 8.1 g/dL   Albumin 3.7 3.5 - 5.0 g/dL   AST 28 15 - 41 U/L   ALT 48 (H) 0 - 44 U/L   Alkaline Phosphatase 45 38 - 126 U/L   Total Bilirubin 0.7 0.3 - 1.2 mg/dL   GFR, Estimated >43 >32 mL/min    Comment: (NOTE) Calculated using the  CKD-EPI Creatinine Equation (2021)    Anion gap 11 5 - 15    Comment: Performed at [facility], [address], [place], [place] [address]  Ammonia     Status: Abnormal   Collection Time: 04/28/23  6:48 AM  Result Value Ref Range   Ammonia 44 (H) 9 - 35 umol/L    Comment: Performed at [facility], [address] [place], [place] [address]     Psychiatric Specialty Exam: Physical Exam  Review of Systems  Psychiatric/Behavioral:  Positive for decreased concentration. The patient is nervous/anxious.     Weight (!) 357 lb (161.9 kg).There is no height or weight on file to calculate BMI.  General Appearance: Casual  Eye Contact:  Fair  Speech:  Slow  Volume:  Decreased  Mood:  Anxious  Affect:  Constricted  Thought Process:  Descriptions of Associations: Intact  Orientation:  Full (Time, Place, and Person)  Thought Content:  Rumination and trust issues  Suicidal Thoughts:  No  Homicidal Thoughts:  No  Memory:  Immediate;   Good Recent;   Fair Remote;   Fair  Judgement:  Fair  Insight:  Shallow  Psychomotor Activity:  Decreased  Concentration:  Concentration: Fair and Attention Span: Fair  Recall:  [employer] of Knowledge:  Fair  Language:  Good  Akathisia:  No  Handed:  Right  AIMS (if indicated):     Assets:  Communication Skills Desire for Improvement Housing Social Support  ADL's:  Intact  Cognition:  WNL  Sleep:  ok     Assessment/Plan: Schizophrenia, paranoid (HCC) - Plan: chlorproMAZINE (THORAZINE) 50  MG tablet, chlorproMAZINE (THORAZINE) 100 MG tablet, risperidone (RISPERDAL) 4 MG tablet  Anxiety - Plan: chlorproMAZINE (THORAZINE) 50 MG tablet  Mild tetrahydrocannabinol (THC) abuse  Morbid obesity (HCC) - Plan: metFORMIN (GLUCOPHAGE-XR) 500 MG 24 hr tablet  I reviewed blood work results, notes from inpatient and current medication.  He was taking Risperdal 1 mg and Zoloft 50 mg but admitted not consistent with medication and started to using Gummies and mushroom.  Now he is taking moderate dose of antipsychotic medication.  I had a long discussion with the patient and his mother.  I believe he is taking a very moderate dose and taking 2 antipsychotic medication.  Recommend to discontinue gabapentin as patient no longer taking.  He is also not taking trazodone and sleeping okay.  I will reduce Thorazine at bedtime but we will take a lot of close monitoring for his medication.  I strongly encouraged if he noticed that symptoms are coming back then he need to call us back immediately.  I also encouraged to contact the primary care for metformin since it is given for weight loss and that has been helpful.  I will provide 1 time dose of metformin but in the future if need to get from his primary care.  I encourage give more time for therapy as patient may not be ready soon after discharge but hoping he will connect with a therapist in the future.  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  I will follow up in a month.  He will continue risperidone 4 mg twice a day, Thorazine 50 mg 8 AM, 5 PM and 100 mg at bedtime.  Recommended to call us back if is any question or any concern.   Follow Up Instructions:     I discussed the assessment and treatment plan with the patient. The  patient was provided an opportunity to ask questions and all were answered. The patient agreed with the plan and demonstrated an understanding of the instructions.   The patient was advised to call back or seek an in-person  evaluation if the symptoms worsen or if the condition fails to improve as anticipated.    Collaboration of Care: Other provider involved in patient's care AEB notes are available in epic to review.  I will also forward my note to PCP.  Patient/Guardian was advised Release of Information must be obtained prior to any record release in order to collaborate their care with an outside provider. Patient/Guardian was advised if they have not already done so to contact the registration department to sign all necessary forms in order for [place] to release information regarding their care.   Consent: Patient/Guardian gives verbal consent for treatment and assignment of benefits for services provided during this visit. Patient/Guardian expressed understanding and agreed to proceed.     I provided 32 minutes of non face to face time during this encounter.  Note: This document was prepared by [employer] voice dictation technology and any errors that results from this process are unintentional.    Cleotis Nipper, MD 06/02/2023

## 2023-06-10 ENCOUNTER — Telehealth (HOSPITAL_COMMUNITY): Payer: Self-pay | Admitting: *Deleted

## 2023-06-10 NOTE — Telephone Encounter (Signed)
Writer spoke with pt's mother, [name], who requested a refill of the Chlorpromazine 50 mg bid. Mother states that pt is "completely out" of this medication. First thing is mother said that she was using the 50 mg tabs also for the ordered dose of #3, 50 mg tabs at bedtime (and the dose of 50 mg Bid) from same script as there was an issue getting one of he scripts after d/c from [facility]. Writer can verify there were issues with getting both scripts as I spoke with [facility] multiple times regarding this however [facility] did say that both scripts had been picked up on 05/06/23. Secondly prescription was spilled when they were on vacation although she says she is pretty sure they found all the pills. Both scripts were e-scribed on 06/02/23. Pt has a f/u scheduled for 07/07/23. Please review and advise.

## 2023-06-10 NOTE — Telephone Encounter (Signed)
He is on Thorazine 50 mg twice a day and 100 mg at bedtime.  There are 2 prescriptions sent to his pharmacy.  If he had picked up his medication that he does not need a new medication.

## 2023-07-02 ENCOUNTER — Other Ambulatory Visit (HOSPITAL_COMMUNITY): Payer: Self-pay | Admitting: Psychiatry

## 2023-07-02 DIAGNOSIS — F200 Paranoid schizophrenia: Secondary | ICD-10-CM

## 2023-07-07 ENCOUNTER — Telehealth (HOSPITAL_BASED_OUTPATIENT_CLINIC_OR_DEPARTMENT_OTHER): Payer: 59 | Admitting: Psychiatry

## 2023-07-07 ENCOUNTER — Encounter (HOSPITAL_COMMUNITY): Payer: Self-pay | Admitting: Psychiatry

## 2023-07-07 VITALS — Wt 338.0 lb

## 2023-07-07 DIAGNOSIS — R251 Tremor, unspecified: Secondary | ICD-10-CM | POA: Diagnosis not present

## 2023-07-07 DIAGNOSIS — F419 Anxiety disorder, unspecified: Secondary | ICD-10-CM | POA: Diagnosis not present

## 2023-07-07 DIAGNOSIS — F200 Paranoid schizophrenia: Secondary | ICD-10-CM

## 2023-07-07 MED ORDER — CHLORPROMAZINE HCL 100 MG PO TABS
100.0000 mg | ORAL_TABLET | Freq: Every day | ORAL | 0 refills | Status: DC
Start: 2023-07-07 — End: 2023-08-05

## 2023-07-07 MED ORDER — BENZTROPINE MESYLATE 0.5 MG PO TABS
0.5000 mg | ORAL_TABLET | Freq: Every day | ORAL | 1 refills | Status: DC
Start: 2023-07-07 — End: 2023-08-05

## 2023-07-07 MED ORDER — RISPERIDONE 4 MG PO TABS
4.0000 mg | ORAL_TABLET | Freq: Two times a day (BID) | ORAL | 1 refills | Status: DC
Start: 2023-07-07 — End: 2023-08-05

## 2023-07-07 NOTE — Progress Notes (Signed)
[facility] MD Virtual Progress Note   Patient Location: Home Provider Location: Home Office  I connect with patient by video and verified that I am speaking with correct person by using two identifiers. I discussed the limitations of evaluation and management by telemedicine and the availability of in person appointments. I also discussed with the patient that there may be a patient responsible charge related to this service. The patient expressed understanding and agreed to proceed.  Matthew Padilla [id number] 27 y.o.  07/07/2023 2:07 PM  History of Present Illness:  Patient was evaluated by video session.  His mother [name] was also present.  On the last visit we cut down the Thorazine 50 mg.  He was discharged on 250 mg Thorazine along with Risperdal 4 mg twice a day, hydroxyzine 25 mg as needed.  His mother was concerned that he was on too much medication.  Patient noticed some improvement since the dose reduced of Thorazine 200 mg.  He is taking 50 mg twice a day and 100 mg at bedtime.  He still complaining of fatigue, sedation, lack of motivation.  His thought process is slow.  He admitted sometimes feel numb.  His mother concerned that he does not do anything other than sometimes watching football.  She questioned to walk at least 3-4 times a week.  Most of the information was obtained from his mother.  Patient denies any hallucination, paranoia, suicidal thoughts.  Denies using drug use.  He has mild tremors in his hand.  His speech is slow and sometime he answered yes or no.  Patient appears to be overmedicated.  His mother concerned he sleeps too much.  He is currently not working and taking a leave of absence.  He denies any aggression, violence, mania.  His appetite is okay and weight is unchanged from the past.  He denies any panic attack.  Past Psychiatric History: H/O schizophrenia.  Inpatient at [facility] in July 2024 due to decompensation and n/c with meds, using mushroom. D/C  on Thorazine, Risperdal, Gabapentin and Zoloft was discontinued. Olanzapine and Seroquel was ineffective. H/O inpatient in 2019 at [facility] after having suicidal thoughts to crash his car but no intent.  D/C on REXULTI and Wellbutrin but stopped after 4 weeks as no changed. H/O inpatient in October 2022 at [facility] for bizarre behavior and psychosis.  Given Haldol but stopped due to excessive drooling and sedation.  H/O mania nd Psychosis.     Outpatient Encounter Medications as of 07/07/2023  Medication Sig   albuterol (VENTOLIN HFA) 108 (90 Base) MCG/ACT inhaler Inhale 2 puffs into the lungs every 6 (six) hours as needed for wheezing or shortness of breath.   chlorproMAZINE (THORAZINE) 100 MG tablet Take 1 tablet (100 mg total) by mouth at bedtime.   chlorproMAZINE (THORAZINE) 50 MG tablet Take 1 tablet (50 mg total) by mouth 2 (two) times daily.   loratadine (CLARITIN) 10 MG tablet Take 1 tablet (10 mg total) by mouth daily.   metFORMIN (GLUCOPHAGE-XR) 500 MG 24 hr tablet Take 1 tablet (500 mg total) by mouth daily with breakfast.   polyethylene glycol (MIRALAX / GLYCOLAX) 17 g packet Take 17 g by mouth daily as needed.   risperidone (RISPERDAL) 4 MG tablet Take 1 tablet (4 mg total) by mouth 2 (two) times daily.   traZODone (DESYREL) 100 MG tablet Take 2 tablets (200 mg total) by mouth at bedtime as needed for sleep. (Patient not taking: Reported on 06/02/2023)  No facility-administered encounter medications on file as of 07/07/2023.    Recent Results (from the past 2160 hour(s))  CBC with Differential     Status: Abnormal   Collection Time: 04/18/23  3:58 AM  Result Value Ref Range   WBC 8.7 4.0 - 10.5 K/uL   RBC 5.74 4.22 - 5.81 MIL/uL   Hemoglobin 13.9 13.0 - 17.0 g/dL   HCT 16.1 09.6 - 04.5 %   MCV 77.9 (L) 80.0 - 100.0 fL   MCH 24.2 (L) 26.0 - 34.0 pg   MCHC 31.1 30.0 - 36.0 g/dL   RDW 40.9 (H) 81.1 - 91.4 %   Platelets 242 150 - 400 K/uL   nRBC 0.0 0.0 - 0.2 %    Neutrophils Relative % 50 %   Neutro Abs 4.4 1.7 - 7.7 K/uL   Lymphocytes Relative 39 %   Lymphs Abs 3.3 0.7 - 4.0 K/uL   Monocytes Relative 9 %   Monocytes Absolute 0.8 0.1 - 1.0 K/uL   Eosinophils Relative 1 %   Eosinophils Absolute 0.1 0.0 - 0.5 K/uL   Basophils Relative 1 %   Basophils Absolute 0.0 0.0 - 0.1 K/uL   Immature Granulocytes 0 %   Abs Immature Granulocytes 0.03 0.00 - 0.07 K/uL    Comment: Performed at [facility], [address], [place], [place] [address]  Comprehensive metabolic panel     Status: Abnormal   Collection Time: 04/18/23  3:58 AM  Result Value Ref Range   Sodium 137 135 - 145 mmol/L   Potassium 3.6 3.5 - 5.1 mmol/L   Chloride 105 98 - 111 mmol/L   CO2 23 22 - 32 mmol/L   Glucose, Bld 115 (H) 70 - 99 mg/dL    Comment: Glucose reference range applies only to samples taken after fasting for at least 8 hours.   BUN 16 6 - 20 mg/dL   Creatinine, Ser 6.21 0.61 - 1.24 mg/dL   Calcium 8.8 (L) 8.9 - 10.3 mg/dL   Total Protein 6.6 6.5 - 8.1 g/dL   Albumin 3.8 3.5 - 5.0 g/dL   AST 51 (H) 15 - 41 U/L   ALT 44 0 - 44 U/L   Alkaline Phosphatase 47 38 - 126 U/L   Total Bilirubin 0.7 0.3 - 1.2 mg/dL   GFR, Estimated >30 >86 mL/min    Comment: (NOTE) Calculated using the CKD-EPI Creatinine Equation (2021)    Anion gap 9 5 - 15    Comment: Performed at [facility], [address], [place], [place] [address]  Troponin I (High Sensitivity)     Status: Abnormal   Collection Time: 04/18/23  3:58 AM  Result Value Ref Range   Troponin I (High Sensitivity) 19 (H) <18 ng/L    Comment: (NOTE) Elevated high sensitivity troponin I (hsTnI) values and significant  changes across serial measurements may suggest ACS but many other  chronic and acute conditions are known to elevate hsTnI results.  Refer to the "Links" section for chest pain algorithms and additional  guidance. Performed at [facility], [address] [place], [place] [address]    CK     Status: Abnormal   Collection Time: 04/18/23  3:58 AM  Result Value Ref Range   Total CK 1,996 (H) 49 - 397 U/L    Comment: Performed at [facility], [address], [place], [place] [address]  Urinalysis, Routine w reflex microscopic -Urine, Clean Catch  Status: Abnormal   Collection Time: 04/18/23  4:43 AM  Result Value Ref Range   Color, Urine STRAW (A) YELLOW   APPearance CLEAR CLEAR   Specific Gravity, Urine 1.005 1.005 - 1.030   pH 6.0 5.0 - 8.0   Glucose, UA NEGATIVE NEGATIVE mg/dL   Hgb urine dipstick NEGATIVE NEGATIVE   Bilirubin Urine NEGATIVE NEGATIVE   Ketones, ur NEGATIVE NEGATIVE mg/dL   Protein, ur NEGATIVE NEGATIVE mg/dL   Nitrite NEGATIVE NEGATIVE   Leukocytes,Ua NEGATIVE NEGATIVE    Comment: Performed at [facility], [address] [place], [place] [address]  Urine rapid drug screen (hosp performed)     Status: Abnormal   Collection Time: 04/18/23  4:43 AM  Result Value Ref Range   Opiates NONE DETECTED NONE DETECTED   Cocaine NONE DETECTED NONE DETECTED   Benzodiazepines NONE DETECTED NONE DETECTED   Amphetamines NONE DETECTED NONE DETECTED   Tetrahydrocannabinol POSITIVE (A) NONE DETECTED   Barbiturates NONE DETECTED NONE DETECTED    Comment: (NOTE) DRUG SCREEN FOR MEDICAL PURPOSES ONLY.  IF CONFIRMATION IS NEEDED FOR ANY PURPOSE, NOTIFY LAB WITHIN 5 DAYS.  LOWEST DETECTABLE LIMITS FOR URINE DRUG SCREEN Drug Class                     Cutoff (ng/mL) Amphetamine and metabolites    1000 Barbiturate and metabolites    200 Benzodiazepine                 200 Opiates and metabolites        300 Cocaine and metabolites        300 THC                            50 Performed at [facility], [address] [place], [place] [address]   Ethanol     Status: None   Collection Time: 04/18/23  5:36 AM  Result Value Ref Range   Alcohol, Ethyl (B) <10 <10 mg/dL    Comment: (NOTE) Lowest detectable limit for serum alcohol is 10  mg/dL.  For medical purposes only. Performed at [facility], [address] [place], [place] [address]   Troponin I (High Sensitivity)     Status: Abnormal   Collection Time: 04/18/23  5:36 AM  Result Value Ref Range   Troponin I (High Sensitivity) 18 (H) <18 ng/L    Comment: (NOTE) Elevated high sensitivity troponin I (hsTnI) values and significant  changes across serial measurements may suggest ACS but many other  chronic and acute conditions are known to elevate hsTnI results.  Refer to the "Links" section for chest pain algorithms and additional  guidance. Performed at [facility], [address], [place], [place] [address]   CK     Status: Abnormal   Collection Time: 04/18/23 10:54 PM  Result Value Ref Range   Total CK 1,183 (H) 49 - 397 U/L    Comment: Performed at [facility], [address], [place], [place] [address]  CK     Status: Abnormal   Collection Time: 04/22/23  7:16 PM  Result Value Ref Range   Total CK 937 (H) 49 - 397 U/L    Comment: Performed at [facility], [address], [place], [place] [address]  Lipid panel     Status: Abnormal   Collection Time: 04/22/23  7:16 PM  Result  Value Ref Range   Cholesterol 126 0 - 200 mg/dL   Triglycerides 161 <096 mg/dL   HDL 21 (L) >04 mg/dL   Total CHOL/HDL Ratio 6.0 RATIO   VLDL 23 0 - 40 mg/dL   LDL Cholesterol 82 0 - 99 mg/dL    Comment:        Total Cholesterol/HDL:CHD Risk Coronary Heart Disease Risk Table                     Men   Women  1/2 Average Risk   3.4   3.3  Average Risk       5.0   4.4  2 X Average Risk   9.6   7.1  3 X Average Risk  23.4   11.0        Use the calculated Patient Ratio above and the CHD Risk Table to determine the patient's CHD Risk.        ATP III CLASSIFICATION (LDL):  <100     mg/dL   Optimal  540-981  mg/dL   Near or Above                    Optimal  130-159  mg/dL   Borderline  191-478  mg/dL   High  >295     mg/dL   Very  High Performed at [facility], [address], [place], [place] [address]   Hemoglobin A1c     Status: None   Collection Time: 04/22/23  7:16 PM  Result Value Ref Range   Hgb A1c MFr Bld 5.4 4.8 - 5.6 %    Comment: (NOTE)         Prediabetes: 5.7 - 6.4         Diabetes: >6.4         Glycemic control for adults with diabetes: <7.0    Mean Plasma Glucose 108 mg/dL    Comment: (NOTE) Performed At: [facility] [address] [place], [place] [id number] Jolene Schimke MD EX:[id number]   Comprehensive metabolic panel     Status: Abnormal   Collection Time: 04/22/23  7:16 PM  Result Value Ref Range   Sodium 132 (L) 135 - 145 mmol/L   Potassium 3.4 (L) 3.5 - 5.1 mmol/L   Chloride 103 98 - 111 mmol/L   CO2 19 (L) 22 - 32 mmol/L   Glucose, Bld 115 (H) 70 - 99 mg/dL    Comment: Glucose reference range applies only to samples taken after fasting for at least 8 hours.   BUN 14 6 - 20 mg/dL   Creatinine, Ser 1.02 0.61 - 1.24 mg/dL   Calcium 8.3 (L) 8.9 - 10.3 mg/dL   Total Protein 6.9 6.5 - 8.1 g/dL   Albumin 4.1 3.5 - 5.0 g/dL   AST 53 (H) 15 - 41 U/L   ALT 59 (H) 0 - 44 U/L   Alkaline Phosphatase 50 38 - 126 U/L   Total Bilirubin 1.0 0.3 - 1.2 mg/dL   GFR, Estimated >72 >53 mL/min    Comment: (NOTE) Calculated using the CKD-EPI Creatinine Equation (2021)    Anion gap 10 5 - 15    Comment: Performed at [facility], [address] [place], [place] [address]  CBC with Differential/Platelet     Status: Abnormal   Collection Time: 04/28/23  6:48 AM  Result Value Ref Range   WBC 5.8 4.0 - 10.5 K/uL   RBC 5.55 4.22 -  5.81 MIL/uL   Hemoglobin 13.6 13.0 - 17.0 g/dL   HCT 16.1 09.6 - 04.5 %   MCV 77.8 (L) 80.0 - 100.0 fL   MCH 24.5 (L) 26.0 - 34.0 pg   MCHC 31.5 30.0 - 36.0 g/dL   RDW 40.9 (H) 81.1 - 91.4 %   Platelets 212 150 - 400 K/uL   nRBC 0.0 0.0 - 0.2 %   Neutrophils Relative % 48 %   Neutro Abs 2.8 1.7 - 7.7 K/uL    Lymphocytes Relative 41 %   Lymphs Abs 2.4 0.7 - 4.0 K/uL   Monocytes Relative 9 %   Monocytes Absolute 0.5 0.1 - 1.0 K/uL   Eosinophils Relative 1 %   Eosinophils Absolute 0.1 0.0 - 0.5 K/uL   Basophils Relative 1 %   Basophils Absolute 0.0 0.0 - 0.1 K/uL   Immature Granulocytes 0 %   Abs Immature Granulocytes 0.02 0.00 - 0.07 K/uL    Comment: Performed at [facility], [address], [place], [place] [address]  CK     Status: None   Collection Time: 04/28/23  6:48 AM  Result Value Ref Range   Total CK 223 49 - 397 U/L    Comment: Performed at [facility], [address] [place], [place] [address]  Comprehensive metabolic panel     Status: Abnormal   Collection Time: 04/28/23  6:48 AM  Result Value Ref Range   Sodium 137 135 - 145 mmol/L   Potassium 4.0 3.5 - 5.1 mmol/L   Chloride 107 98 - 111 mmol/L   CO2 19 (L) 22 - 32 mmol/L   Glucose, Bld 91 70 - 99 mg/dL    Comment: Glucose reference range applies only to samples taken after fasting for at least 8 hours.   BUN 10 6 - 20 mg/dL   Creatinine, Ser 8.65 0.61 - 1.24 mg/dL   Calcium 8.9 8.9 - 78.4 mg/dL   Total Protein 6.5 6.5 - 8.1 g/dL   Albumin 3.7 3.5 - 5.0 g/dL   AST 28 15 - 41 U/L   ALT 48 (H) 0 - 44 U/L   Alkaline Phosphatase 45 38 - 126 U/L   Total Bilirubin 0.7 0.3 - 1.2 mg/dL   GFR, Estimated >69 >62 mL/min    Comment: (NOTE) Calculated using the CKD-EPI Creatinine Equation (2021)    Anion gap 11 5 - 15    Comment: Performed at [facility], [address], [place], [place] [address]  Ammonia     Status: Abnormal   Collection Time: 04/28/23  6:48 AM  Result Value Ref Range   Ammonia 44 (H) 9 - 35 umol/L    Comment: Performed at [facility], [address] [place], [place] [address]     Psychiatric Specialty Exam: Physical Exam  Review of Systems  Weight (!) 338 lb (153.3 kg).There is no height or weight on file to calculate  BMI.  General Appearance: Casual  Eye Contact:  Fair  Speech:  Slow  Volume:  Decreased  Mood:  Euthymic  Affect:  Flat  Thought Process:  Goal Directed  Orientation:  Full (Time, Place, and Person)  Thought Content:  WDL  Suicidal Thoughts:  No  Homicidal Thoughts:  No  Memory:  Immediate;   Fair Recent;   Fair Remote;   Fair  Judgement:  Intact  Insight:  Present  Psychomotor Activity:  Decreased  Concentration:  Concentration: Fair and Attention Span:  Fair  Recall:  [employer] of Knowledge:  Fair  Language:  Good  Akathisia:   tremors in hand  Handed:  Right  AIMS (if indicated):     Assets:  Communication Skills Desire for Improvement Housing Social Support  ADL's:  Intact  Cognition:  WNL  Sleep:  too much     Assessment/Plan: Schizophrenia, paranoid (HCC) - Plan: chlorproMAZINE (THORAZINE) 100 MG tablet, risperidone (RISPERDAL) 4 MG tablet  Anxiety  Tremor - Plan: benztropine (COGENTIN) 0.5 MG tablet  Patient appears sedated, tired with lack of motivation and fatigue.  We are trying to slowly cut down his psychotropic medication however there is a concern that his symptoms relapse.  His mother also concerned about overmedication.  He is staying with his parents and currently not working.  He denies drug use which had triggered the last hospitalization.  He started therapy with Marchelle Folks at [facility] every 2 weeks and that has been helping.  We talk about polypharmacy and moderate dose of antipsychotic medication.  Recommend to discontinue Thorazine in the morning and keep the Thorazine 100 mg only at bedtime, continue risperidone 4 mg twice a day and I will add low-dose Cogentin to help the tremors.  He is no longer taking hydroxyzine and trazodone.  He is no longer taking gabapentin.  I encouraged to keep appointment with his therapist.  In the past he was only taking Risperdal 1 mg and trazodone 100 mg but he stopped taking the medication and started using drugs that  relapse his last hospitalization.  I reviewed the treatment plan with the patient and his mother and they agreed.  We will follow-up in 2 months.  However if symptoms started to come back then he will contact us sooner.  I also recommend to contact his primary care for metformin for weight loss.  We have provided 30-day supply on the last visit but he need to get future refills from primary care.   Follow Up Instructions:     I discussed the assessment and treatment plan with the patient. The patient was provided an opportunity to ask questions and all were answered. The patient agreed with the plan and demonstrated an understanding of the instructions.   The patient was advised to call back or seek an in-person evaluation if the symptoms worsen or if the condition fails to improve as anticipated.    Collaboration of Care: Other provider involved in patient's care AEB recommend to see his primary care for management of weight loss.  Patient/Guardian was advised Release of Information must be obtained prior to any record release in order to collaborate their care with an outside provider. Patient/Guardian was advised if they have not already done so to contact the registration department to sign all necessary forms in order for [place] to release information regarding their care.   Consent: Patient/Guardian gives verbal consent for treatment and assignment of benefits for services provided during this visit. Patient/Guardian expressed understanding and agreed to proceed.     I provided 28 minutes of non face to face time during this encounter.  Note: This document was prepared by [employer] voice dictation technology and any errors that results from this process are unintentional.    Cleotis Nipper, MD 07/07/2023

## 2023-07-12 ENCOUNTER — Encounter: Payer: Self-pay | Admitting: Family Medicine

## 2023-07-12 ENCOUNTER — Ambulatory Visit (INDEPENDENT_AMBULATORY_CARE_PROVIDER_SITE_OTHER): Payer: 59 | Admitting: Family Medicine

## 2023-07-12 VITALS — BP 124/84 | HR 121 | Temp 97.7°F | Ht 77.0 in | Wt 340.2 lb

## 2023-07-12 DIAGNOSIS — Z6841 Body Mass Index (BMI) 40.0 and over, adult: Secondary | ICD-10-CM | POA: Diagnosis not present

## 2023-07-12 DIAGNOSIS — Z23 Encounter for immunization: Secondary | ICD-10-CM | POA: Diagnosis not present

## 2023-07-12 DIAGNOSIS — F200 Paranoid schizophrenia: Secondary | ICD-10-CM

## 2023-07-12 MED ORDER — METFORMIN HCL ER 500 MG PO TB24
500.0000 mg | ORAL_TABLET | Freq: Every day | ORAL | 3 refills | Status: DC
Start: 2023-07-12 — End: 2024-08-17

## 2023-07-12 NOTE — Progress Notes (Signed)
   Solly Derasmo is a 27 y.o. male who presents today for an office visit.  Assessment/Plan:  Chronic Problems Addressed Today: Morbid obesity (HCC) He is down 30 pounds since her last visit.  Congratulated patient on weight loss.  He is doing a great job with diet and exercise and has upcoming appointment with nutritionist.  We will take over prescription for his metformin 500 mg daily.  He has been tolerating well.  We did briefly discuss GLP-1 agonist however given his great response to lifestyle inventions and metformin we will continue with current plan for now.  Recheck again in 6 to 12 months.  Schizophrenia, paranoid [facility]) Following with psychiatry.  He is now on Risperdal 8 mg daily, Thorazine 100 mg nightly.  Doing well this.  Per his mother they are potentially working on weaning down the dose which should help some with the metabolic side effects.  Flu shot given today.     Subjective:  HPI:  See Assessment / plan for status of chronic conditions.  Patient here today as he would like for [place] to take over prescription for metformin.  This is currently being prescribed by his psychiatrist to help manage the metabolic side effects of his psychiatric medications.  He has been doing well with this.  No diarrhea.  He is working on diet and exercise.  He is down about 30 pounds since our last visit.       Objective:  Physical Exam: BP 124/84   Pulse (!) 121   Temp 97.7 F (36.5 C)   Ht 6\' 5"  (1.956 m)   Wt (!) 340 lb 3.2 oz (154.3 kg)   SpO2 96%   BMI 40.34 kg/m   Wt Readings from Last 3 Encounters:  07/12/23 (!) 340 lb 3.2 oz (154.3 kg)  05/24/23 (!) 371 lb (168.3 kg)  04/18/23 (!) 378 lb 15.5 oz (171.9 kg)    Gen: No acute distress, resting comfortably CV: Regular rate and rhythm with no murmurs appreciated Pulm: Normal work of breathing, clear to auscultation bilaterally with no crackles, wheezes, or rhonchi Neuro: Grossly normal, moves all extremities Psych: Normal  affect and thought content      Paislynn Hegstrom M. Jimmey Ralph, MD 07/12/2023 11:22 AM

## 2023-07-12 NOTE — Assessment & Plan Note (Addendum)
Following with psychiatry.  He is now on Risperdal 8 mg daily, Thorazine 100 mg nightly.  Doing well this.  Per his mother they are potentially working on weaning down the dose which should help some with the metabolic side effects.

## 2023-07-12 NOTE — Assessment & Plan Note (Signed)
He is down 30 pounds since her last visit.  Congratulated patient on weight loss.  He is doing a great job with diet and exercise and has upcoming appointment with nutritionist.  We will take over prescription for his metformin 500 mg daily.  He has been tolerating well.  We did briefly discuss GLP-1 agonist however given his great response to lifestyle inventions and metformin we will continue with current plan for now.  Recheck again in 6 to 12 months.

## 2023-07-12 NOTE — Patient Instructions (Signed)
It was very nice to see you today!  We will refill metformin.  Please keep working on diet and exercise.  Return for Annual Physical.   Take care, Dr Jimmey Ralph  PLEASE NOTE:  If you had any lab tests, please let us know if you have not heard back within a few days. You may see your results on mychart before we have a chance to review them but we will give you a call once they are reviewed by [place].   If we ordered any referrals today, please let us know if you have not heard from their office within the next week.   If you had any urgent prescriptions sent in today, please check with the pharmacy within an hour of our visit to make sure the prescription was transmitted appropriately.   Please try these tips to maintain a healthy lifestyle:  Eat at least 3 REAL meals and 1-2 snacks per day.  Aim for no more than 5 hours between eating.  If you eat breakfast, please do so within one hour of getting up.   Each meal should contain half fruits/vegetables, one quarter protein, and one quarter carbs (no bigger than a computer mouse)  Cut down on sweet beverages. This includes juice, soda, and sweet tea.   Drink at least 1 glass of water with each meal and aim for at least 8 glasses per day  Exercise at least 150 minutes every week.

## 2023-07-13 ENCOUNTER — Telehealth (HOSPITAL_COMMUNITY): Payer: 59 | Admitting: Psychiatry

## 2023-08-05 ENCOUNTER — Ambulatory Visit (HOSPITAL_COMMUNITY): Payer: 59 | Admitting: Psychiatry

## 2023-08-05 ENCOUNTER — Encounter (HOSPITAL_COMMUNITY): Payer: Self-pay | Admitting: Psychiatry

## 2023-08-05 ENCOUNTER — Other Ambulatory Visit: Payer: Self-pay

## 2023-08-05 DIAGNOSIS — F200 Paranoid schizophrenia: Secondary | ICD-10-CM

## 2023-08-05 MED ORDER — RISPERIDONE 3 MG PO TABS
3.0000 mg | ORAL_TABLET | Freq: Two times a day (BID) | ORAL | 1 refills | Status: DC
Start: 2023-08-05 — End: 2023-09-30

## 2023-08-05 MED ORDER — CHLORPROMAZINE HCL 100 MG PO TABS
100.0000 mg | ORAL_TABLET | Freq: Every day | ORAL | 1 refills | Status: DC
Start: 2023-08-05 — End: 2023-09-30

## 2023-08-05 NOTE — Progress Notes (Deleted)
[facility] MD Virtual Progress Note   Patient Location: Home Provider Location: Home Office  I connect with patient by video and verified that I am speaking with correct person by using two identifiers. I discussed the limitations of evaluation and management by telemedicine and the availability of in person appointments. I also discussed with the patient that there may be a patient responsible charge related to this service. The patient expressed understanding and agreed to proceed.  Matthew Padilla [id number] 27 y.o.  08/05/2023 11:48 AM  History of Present Illness:   Past Psychiatric History: H/O schizophrenia.  Inpatient at [facility] in July 2024 due to decompensation and n/c with meds, using mushroom. D/C on Thorazine, Risperdal, Gabapentin and Zoloft was discontinued. Olanzapine and Seroquel was ineffective. H/O inpatient in 2019 at [facility] after having suicidal thoughts to crash his car but no intent.  D/C on REXULTI and Wellbutrin but stopped after 4 weeks as no changed. H/O inpatient in October 2022 at [facility] for bizarre behavior and psychosis.  Given Haldol but stopped due to excessive drooling and sedation.  H/O mania nd Psychosis.     Outpatient Encounter Medications as of 08/05/2023  Medication Sig   albuterol (VENTOLIN HFA) 108 (90 Base) MCG/ACT inhaler Inhale 2 puffs into the lungs every 6 (six) hours as needed for wheezing or shortness of breath.   benztropine (COGENTIN) 0.5 MG tablet Take 1 tablet (0.5 mg total) by mouth daily.   chlorproMAZINE (THORAZINE) 100 MG tablet Take 1 tablet (100 mg total) by mouth at bedtime.   loratadine (CLARITIN) 10 MG tablet Take 1 tablet (10 mg total) by mouth daily.   metFORMIN (GLUCOPHAGE-XR) 500 MG 24 hr tablet Take 1 tablet (500 mg total) by mouth daily with breakfast.   polyethylene glycol (MIRALAX / GLYCOLAX) 17 g packet Take 17 g by mouth daily as needed.   risperidone (RISPERDAL) 4 MG tablet Take 1  tablet (4 mg total) by mouth 2 (two) times daily.   No facility-administered encounter medications on file as of 08/05/2023.    No results found for this or any previous visit (from the past 2160 hour(s)).    Psychiatric Specialty Exam: Physical Exam  Review of Systems  Blood pressure (!) 124/90, pulse (!) 114, height 6\' 5"  (1.956 m), weight (!) 335 lb (152 kg).Body mass index is 39.73 kg/m.  General Appearance: Casual  Eye Contact:  Fair  Speech:  Slow  Volume:  Decreased  Mood:  Euthymic  Affect:  Flat  Thought Process:  Goal Directed  Orientation:  Full (Time, Place, and Person)  Thought Content:  WDL  Suicidal Thoughts:  No  Homicidal Thoughts:  No  Memory:  Immediate;   Fair Recent;   Fair Remote;   Fair  Judgement:  Intact  Insight:  Present  Psychomotor Activity:  Decreased  Concentration:  Concentration: Fair and Attention Span: Fair  Recall:  [employer] of Knowledge:  Fair  Language:  Good  Akathisia:   tremors in hand  Handed:  Right  AIMS (if indicated):     Assets:  Communication Skills Desire for Improvement Housing Social Support  ADL's:  Intact  Cognition:  WNL  Sleep:  too much     Assessment/Plan: No diagnosis found.  Patient appears sedated, tired with lack of motivation and fatigue.  We are trying to slowly cut down his psychotropic medication however there is a concern that his symptoms relapse.  His mother also concerned about overmedication.  He  is staying with his parents and currently not working.  He denies drug use which had triggered the last hospitalization.  He started therapy with Marchelle Folks at [facility] every 2 weeks and that has been helping.  We talk about polypharmacy and moderate dose of antipsychotic medication.  Recommend to discontinue Thorazine in the morning and keep the Thorazine 100 mg only at bedtime, continue risperidone 4 mg twice a day and I will add low-dose Cogentin to help the tremors.  He is no longer taking hydroxyzine and  trazodone.  He is no longer taking gabapentin.  I encouraged to keep appointment with his therapist.  In the past he was only taking Risperdal 1 mg and trazodone 100 mg but he stopped taking the medication and started using drugs that relapse his last hospitalization.  I reviewed the treatment plan with the patient and his mother and they agreed.  We will follow-up in 2 months.  However if symptoms started to come back then he will contact us sooner.  I also recommend to contact his primary care for metformin for weight loss.  We have provided 30-day supply on the last visit but he need to get future refills from primary care.   Follow Up Instructions:     I discussed the assessment and treatment plan with the patient. The patient was provided an opportunity to ask questions and all were answered. The patient agreed with the plan and demonstrated an understanding of the instructions.   The patient was advised to call back or seek an in-person evaluation if the symptoms worsen or if the condition fails to improve as anticipated.    Collaboration of Care: Other provider involved in patient's care AEB recommend to see his primary care for management of weight loss.  Patient/Guardian was advised Release of Information must be obtained prior to any record release in order to collaborate their care with an outside provider. Patient/Guardian was advised if they have not already done so to contact the registration department to sign all necessary forms in order for [place] to release information regarding their care.   Consent: Patient/Guardian gives verbal consent for treatment and assignment of benefits for services provided during this visit. Patient/Guardian expressed understanding and agreed to proceed.     I provided 28 minutes of non face to face time during this encounter.  Note: This document was prepared by [employer] voice dictation technology and any errors that results from this process are  unintentional.    Cleotis Nipper, MD 08/05/2023

## 2023-08-05 NOTE — Progress Notes (Signed)
BH MD/PA/NP OP Progress Note  Patient location; office Provider location; office   08/05/2023 1:11 PM Matthew Padilla  MRN:  [id number]  Chief Complaint:  Chief Complaint  Patient presents with   Follow-up   HPI: Patient came in for his follow-up appointment with his mother [name].  He is a few minutes late for his appointment.  As per mother he is doing better since we cut down the dose.  He is more active and doing yard work when he is told to do so.  He also went to the football game.  His mother is still concerned that he is sedated and does not interact with people around.  She denies the patient is agitated, anger, talking to himself.  Patient did not provide much information and usually his answers are either yes or no.  He is in therapy with Marchelle Folks once a week virtual.  Patient's thought process is slow.  He denies any paranoia, suicidal thoughts or hallucination.  He is eating 2 meals a day.  He lost a few pounds since the last visit.  His sleep is better.  We started him on Cogentin to help the tremors but after a few days he stopped taking it because it was making him more tired.  He is taking Thorazine 100 mg at bedtime and risperidone 4 mg twice a day.  Most of the question was answered by his mother as patient remained very quiet.  He is not working but in the past he had worked on and off.  Patient and his mother denies using drugs.  Patient denies any nervousness, panic attack but thought process remains very slow.  Visit Diagnosis:    ICD-10-CM   1. Schizophrenia, paranoid (HCC)  F20.0 risperidone (RISPERDAL) 3 MG tablet    chlorproMAZINE (THORAZINE) 100 MG tablet      Past Psychiatric History: Reviewed. H/O schizophrenia.  Inpatient at [facility] in July 2024 due to decompensation and n/c with meds, using mushroom. D/C on Thorazine, Risperdal, Gabapentin and Zoloft was discontinued. Olanzapine and Seroquel was ineffective. H/O inpatient in 2019 at [facility] after having  suicidal thoughts to crash his car but no intent.  D/C on REXULTI and Wellbutrin but stopped after 4 weeks as no changed. H/O inpatient in October 2022 at [facility] for bizarre behavior and psychosis.  Given Haldol but stopped due to excessive drooling and sedation.  H/O mania nd Psychosis.    Past Medical History:  Past Medical History:  Diagnosis Date   Anxiety    Low testosterone in male    Paranoid schizophrenia (HCC)    History reviewed. No pertinent surgical history.  Family Psychiatric History: Reviewed.  Family History: History reviewed. No pertinent family history.  Social History:  Social History   Socioeconomic History   Marital status: Single    Spouse name: Not on file   Number of children: Not on file   Years of education: Not on file   Highest education level: Not on file  Occupational History   Not on file  Tobacco Use   Smoking status: Never   Smokeless tobacco: Never  Substance and Sexual Activity   Alcohol use: Not Currently   Drug use: Yes    Types: Other-see comments   Sexual activity: Never  Other Topics Concern   Not on file  Social History Narrative   Not on file   Social Determinants of Health   Financial Resource Strain: Not on file  Food Insecurity: Patient Declined (  04/19/2023)   Hunger Vital Sign    Worried About Running Out of Food in the Last Year: Patient declined    Ran Out of Food in the Last Year: Patient declined  Transportation Needs: Patient Declined (04/19/2023)   PRAPARE - Administrator, Civil Service (Medical): Patient declined    Lack of Transportation (Non-Medical): Patient declined  Physical Activity: Not on file  Stress: Not on file  Social Connections: Not on file    Allergies: No Known Allergies  Metabolic Disorder Labs: Lab Results  Component Value Date   HGBA1C 5.4 04/22/2023   MPG 108 04/22/2023   Lab Results  Component Value Date   PROLACTIN 22.3 (H) 09/11/2022   Lab Results  Component  Value Date   CHOL 126 04/22/2023   TRIG 113 04/22/2023   HDL 21 (L) 04/22/2023   CHOLHDL 6.0 04/22/2023   VLDL 23 04/22/2023   LDLCALC 82 04/22/2023   Lab Results  Component Value Date   TSH 1.98 09/11/2022   TSH 2.016 07/09/2021    Therapeutic Level Labs: No results found for: "LITHIUM" No results found for: "VALPROATE" No results found for: "CBMZ"  Current Medications: Current Outpatient Medications  Medication Sig Dispense Refill   albuterol (VENTOLIN HFA) 108 (90 Base) MCG/ACT inhaler Inhale 2 puffs into the lungs every 6 (six) hours as needed for wheezing or shortness of breath. 1 each 0   loratadine (CLARITIN) 10 MG tablet Take 1 tablet (10 mg total) by mouth daily.     metFORMIN (GLUCOPHAGE-XR) 500 MG 24 hr tablet Take 1 tablet (500 mg total) by mouth daily with breakfast. 90 tablet 3   polyethylene glycol (MIRALAX / GLYCOLAX) 17 g packet Take 17 g by mouth daily as needed. 14 each 0   chlorproMAZINE (THORAZINE) 100 MG tablet Take 1 tablet (100 mg total) by mouth at bedtime. 30 tablet 1   risperidone (RISPERDAL) 3 MG tablet Take 1 tablet (3 mg total) by mouth 2 (two) times daily. 60 tablet 1   No current facility-administered medications for this visit.     Musculoskeletal: Strength & Muscle Tone: within normal limits Gait & Station: normal Patient leans: N/A  Psychiatric Specialty Exam: Review of Systems  Blood pressure (!) 124/90, pulse (!) 114, height 6\' 5"  (1.956 m), weight (!) 335 lb (152 kg).Body mass index is 39.73 kg/m.  General Appearance: Fairly Groomed  Eye Contact:  Fair  Speech:  Slow  Volume:  Decreased  Mood:   I am ok  Affect:  Constricted and Restricted  Thought Process:  Descriptions of Associations: Intact, thought blocking  Orientation:  Full (Time, Place, and Person)  Thought Content:  I am ok. Do delusions    Suicidal Thoughts:  No  Homicidal Thoughts:  No  Memory:  Immediate;   Fair Recent;   Fair Remote;   Fair  Judgement:  Fair   Insight:  Present  Psychomotor Activity:  Decreased and Psychomotor Retardation  Concentration:  Concentration: Fair and Attention Span: Fair  Recall:  [employer] of Knowledge: Fair  Language: Fair  Akathisia:  No  Handed:  Left  AIMS (if indicated): not done  Assets:  Communication Skills Desire for Improvement Housing Social Support  ADL's:  Intact  Cognition: WNL  Sleep:   better   Screenings: AIMS    Flowsheet Row [facility]  AIMS Total Score 0      GAD-7    Flowsheet  Row Office Visit from 07/12/2023 in [facility] from 05/24/2023 in [facility] from 08/12/2022 in [facility]  Total GAD-7 Score 0 15 2      PHQ2-9    Flowsheet Row Office Visit from 07/12/2023 in [facility] Visit from 05[facility]24/2023 in [facility] [employer] from 01/05/2023 in [facility] from 08/12/2022 in [facility] at [facility] from 09/08/2021 in [facility] at [facility] Total Score 2 4 0 0 1  PHQ-9 Total Score 12 16 -- 1 --      Flowsheet Row Ad[facility] (Discharged) [facility] from 04/18/2023 in [facility] Emergency Department at [facility] from 02/15/2023 in [facility] at [facility] RISK CATEGORY No Risk No Risk Error: Question 1 not populated        Assessment and Plan: I reviewed current medication.  Patient still appears somewhat sedated and have constricted affect.  We will further cut down the risperidone from 4 mg twice a day to 3 mg twice a day.  Continue Thorazine 100 mg at bedtime.  I encouraged to talk to his therapist  Marchelle Folks who he is seeing virtually every week at [facility] to discuss about psychosocial rehab and vocational rehab.  Patient did well for a while until he relapsed into drugs and require inpatient in July.  He was stopped taking the medication.  He does not want to take the Cogentin because it makes him more tired.  I recommend that he need to monitor very closely since we reducing the Risperdal and if signs of any relapse happened then he need to call us back.  Recommended to call us back if is any question or any concern.  Follow-up in 2 months.  His mother reported usually around father's better time he is quite withdrawn but she is hoping reducing the medication may help him more active and motivated.  We will do CMP tomorrow.  Collaboration of Care: Collaboration of Care: Other provider involved in patient's care AEB notes are available in epic to review  Patient/Guardian was advised Release of Information must be obtained prior to any record release in order to collaborate their care with an outside provider. Patient/Guardian was advised if they have not already done so to contact the registration department to sign all necessary forms in order for [place] to release information regarding their care.   Consent: Patient/Guardian gives verbal consent for treatment and assignment of benefits for services provided during this visit. Patient/Guardian expressed understanding and agreed to proceed.    Cleotis Nipper, MD 08/05/2023, 1:11 PM

## 2023-08-26 ENCOUNTER — Ambulatory Visit: Payer: 59 | Admitting: Dietician

## 2023-09-02 ENCOUNTER — Other Ambulatory Visit (HOSPITAL_COMMUNITY): Payer: Self-pay | Admitting: Psychiatry

## 2023-09-02 DIAGNOSIS — R251 Tremor, unspecified: Secondary | ICD-10-CM

## 2023-09-06 ENCOUNTER — Telehealth (HOSPITAL_COMMUNITY): Payer: 59 | Admitting: Psychiatry

## 2023-09-08 ENCOUNTER — Other Ambulatory Visit (HOSPITAL_COMMUNITY): Payer: Self-pay | Admitting: Psychiatry

## 2023-09-08 DIAGNOSIS — F200 Paranoid schizophrenia: Secondary | ICD-10-CM

## 2023-09-15 ENCOUNTER — Ambulatory Visit (INDEPENDENT_AMBULATORY_CARE_PROVIDER_SITE_OTHER): Payer: 59 | Admitting: Internal Medicine

## 2023-09-15 ENCOUNTER — Encounter: Payer: Self-pay | Admitting: Internal Medicine

## 2023-09-15 VITALS — BP 122/76 | HR 107 | Ht 77.0 in | Wt 330.0 lb

## 2023-09-15 DIAGNOSIS — E221 Hyperprolactinemia: Secondary | ICD-10-CM

## 2023-09-15 DIAGNOSIS — E291 Testicular hypofunction: Secondary | ICD-10-CM | POA: Diagnosis not present

## 2023-09-15 MED ORDER — CLOMIPHENE CITRATE 50 MG PO TABS: 50.0000 mg | ORAL_TABLET | Freq: Every day | ORAL | 3 refills | Status: DC

## 2023-09-15 NOTE — Progress Notes (Signed)
Name: Matthew Padilla  MRN/ DOB: [id number], 1996/07/18    Age/ Sex: 27 y.o., male    PCP: Ardith Dark, MD   Reason for Endocrinology Evaluation: Hypogonadism      Date of Initial Endocrinology Evaluation: 01/14/2021    HPI: [name] is a 27 y.o. male with a past medical history of Depression . The patient presented for initial endocrinology clinic visit on 01/14/2021 for consultative assistance with his hypogonadism .    He presented to his PCP in 10/2020  For evaluation of severe depression, lack of motivation and mood changes. Mother asked for evaluation for klinefelter syndrome  Which prompted lab work showing normal TSH 1.98 uIU/mL , low total testosterone at 204 ng/dL ( reference  175-102)  but normal free testosterone 14.4 pg/dL ( reference 5.8-52.7) normal LH and FSH at 6.7 and 1.5 respectively.    Of note the pt was first noted with depression when he entered [place] [employer] at age 29 which resulted in expelling him due to the fact that he stopped going to classes. He was not treated for depression until a couple years later when he was admitted for in-patient facility, pt was non compliant with anti depressants after discharge.     Moved from [place] ~2021 No learning difficulties  He went through puberty at age 61     Has two sisters    Was lost to follow-up from 2022 until his return to our office December 2023   24-hour urinary cortisol was normal at 43.1 mcg, total testosterone low at 115 NG/DL, prolactin slightly elevated at 22.3 NG/mL, with inappropriately normal LH 2.57 mIU/mL and normal TFTs   MRI of the brain showed normal pituitary gland 10/2022  Patient started on clomiphene 10/2022    SUBJECTIVE:    Today (09/15/23):  [name] is here for further evaluation of hypogonadism. He is accompanied by his father today, mother participated in the visit through the phone today.  Patient continues to follow-up with psychiatry for  paranoid schizophrenia   He presented to the ED with complaints of palpitations/tachycardia and increased stress, and possible mania in July 2024, labs were positive  for cannabis and an elevated CK 1996    Patient has been noted with weight loss Continues with walking  Energy level continues to be low  Not working at [employer] anymore  He is not in school at [employer]  Last Clomiphene 03/2023   Has noted improved facial Hair growth   Had recent constipation but no diarrhea  Denies headaches or changes in vision ( Hx of lasik)    Clomiphene 50 mg daily  HISTORY:  Past Medical History:  Past Medical History:  Diagnosis Date   Anxiety    Low testosterone in male    Paranoid schizophrenia (HCC)    Past Surgical History:   Social History:  reports that he has never smoked. He has never used smokeless tobacco. He reports that he does not currently use alcohol. He reports current drug use. Drug: Other-see comments. Family History: family history is not on file.   HOME MEDICATIONS: Allergies as of 09/15/2023   No Known Allergies      Medication List        Accurate as of September 15, 2023  9:59 AM. If you have any questions, ask your nurse or doctor.          STOP taking these medications    polyethylene glycol 17 g packet Commonly known as: MIRALAX /  GLYCOLAX Stopped by: Johnney Ou Domitila Stetler       TAKE these medications    albuterol 108 (90 Base) MCG/ACT inhaler Commonly known as: VENTOLIN HFA Inhale 2 puffs into the lungs every 6 (six) hours as needed for wheezing or shortness of breath.   benztropine 0.5 MG tablet Commonly known as: COGENTIN Take 0.5 mg by mouth daily.   chlorproMAZINE 100 MG tablet Commonly known as: THORAZINE Take 1 tablet (100 mg total) by mouth at bedtime.   loratadine 10 MG tablet Commonly known as: CLARITIN Take 1 tablet (10 mg total) by mouth daily.   metFORMIN 500 MG 24 hr tablet Commonly known as: GLUCOPHAGE-XR Take 1 tablet  (500 mg total) by mouth daily with breakfast.   risperiDONE 3 MG tablet Commonly known as: RISPERDAL Take 1 tablet (3 mg total) by mouth 2 (two) times daily.          REVIEW OF SYSTEMS: A comprehensive ROS was conducted with the patient and is negative except as per HPI     OBJECTIVE:  VS: BP 122/76 (BP Location: Right Arm, Patient Position: Sitting, Cuff Size: Large)   Pulse (!) 107   Ht 6\' 5"  (1.956 m)   Wt (!) 330 lb (149.7 kg)   SpO2 99%   BMI 39.13 kg/m     Wt Readings from Last 3 Encounters:  09/15/23 (!) 330 lb (149.7 kg)  07/12/23 (!) 340 lb 3.2 oz (154.3 kg)  05/24/23 (!) 371 lb (168.3 kg)     EXAM: General:  NAD  Lungs: Clear with good BS bilat   Heart: Auscultation: RRR.  Extremities:  BL LE: No pretibial edema normal   Mental Status: Judgment, insight: Intact     DATA REVIEWED:  Latest Reference Range & Units 04/28/23 06:48  Sodium 135 - 145 mmol/L 137  Potassium 3.5 - 5.1 mmol/L 4.0  Chloride 98 - 111 mmol/L 107  CO2 22 - 32 mmol/L 19 (L)  Glucose 70 - 99 mg/dL 91  BUN 6 - 20 mg/dL 10  Creatinine 3.08 - 6.57 mg/dL 8.46  Calcium 8.9 - 96.2 mg/dL 8.9  Anion gap 5 - 15  11  Alkaline Phosphatase 38 - 126 U/L 45  Albumin 3.5 - 5.0 g/dL 3.7  AST 15 - 41 U/L 28  ALT 0 - 44 U/L 48 (H)  Total Protein 6.5 - 8.1 g/dL 6.5  Ammonia 9 - 35 umol/L 44 (H)  Total Bilirubin 0.3 - 1.2 mg/dL 0.7  GFR, Estimated >95 mL/min >60  (L): Data is abnormally low (H): Data is abnormally high    Latest Reference Range & Units 09/11/22 07:54  LH 1.50 - 9.30 mIU/mL 2.57  Prolactin 2.0 - 18.0 ng/mL 22.3 (H)  Glucose 70 - 99 mg/dL 94  Testosterone-% Free % 3.1  Testosterone, Serum (Total) ng/dL 284 (L)  Free Testosterone, Serum pg/mL 36 (L)  TSH 0.35 - 5.50 uIU/mL 1.98  T4,Free(Direct) 0.60 - 1.60 ng/dL 1.32     MRI brain 4/40/1027   EXAM: MRI HEAD WITHOUT CONTRAST   TECHNIQUE: Multiplanar, multiecho pulse sequences of the brain and  surrounding structures were obtained without intravenous contrast.   COMPARISON:  None Available.   FINDINGS: Brain: Normal size and shape of the pituitary gland. Normal appearance of the infundibulum, suprasellar cistern, and cavernous sinus region.   No infarct, hemorrhage, hydrocephalus, or collection. No white matter disease   Vascular: Major flow voids are preserved   Skull and upper cervical spine: Normal marrow signal  Sinuses/Orbits: Negative   IMPRESSION: Normal noncontrast MRI of the brain and pituitary gland.    ASSESSMENT/PLAN/RECOMMENDATIONS:   Low total serum testosterone:  -Patient has been on clomiphene since January 2024 and has noted clinical improvement, this is most likely due to uncontrolled paranoid schizophrenia and having to be on multiple psychiatric medications which was recently increased and the dose. -He has not been on clomiphene in approximately 6 months -I did explain to the patient and parents the importance of optimizing testosterone levels in the setting of young age, and elevated prolactin level for general wellbeing, quality of life, and bone health  Clomiphene 50 mg daily   2.  Hyperprolactinemia:  -Patient on multiple antipsychotic medications that would cause an increase in prolactin level -No intervention -No symptoms -MRI was normal 10/2022 -I have strongly discouraged the patient from using cannabis products   Follow-up in 4 months   I spent 25 minutes preparing to see the patient by review of recent labs, imaging and procedures, obtaining and reviewing separately obtained history, communicating with the patient/family or caregiver, ordering medications, tests or procedures, and documenting clinical information in the EHR including the differential Dx, treatment, and any further evaluation and other management   Signed electronically by: Lyndle Herrlich, MD  [facility]  [facility] [address] [place] [place], [place] [address] Phone: [phone] FAX: [phone]   CC: Ardith Dark, MD [address] [place] [address] Phone: [phone] Fax: [phone]   Return to Endocrinology clinic as below: Future Appointments  Date Time Provider Department Center  09/30/2023  2:00 PM Arfeen, Phillips Grout, MD [facility] None  10/18/2023  8:10 AM Brenley Priore, Konrad Dolores, MD [facility] None  05/25/2024 10:00 AM Ardith Dark, MD [facility]

## 2023-09-30 ENCOUNTER — Encounter (HOSPITAL_COMMUNITY): Payer: Self-pay

## 2023-09-30 ENCOUNTER — Telehealth (HOSPITAL_COMMUNITY): Payer: 59 | Admitting: Psychiatry

## 2023-09-30 ENCOUNTER — Encounter (HOSPITAL_COMMUNITY): Payer: Self-pay | Admitting: Psychiatry

## 2023-09-30 VITALS — Wt 326.0 lb

## 2023-09-30 DIAGNOSIS — F419 Anxiety disorder, unspecified: Secondary | ICD-10-CM | POA: Diagnosis not present

## 2023-09-30 DIAGNOSIS — R251 Tremor, unspecified: Secondary | ICD-10-CM

## 2023-09-30 DIAGNOSIS — F200 Paranoid schizophrenia: Secondary | ICD-10-CM

## 2023-09-30 MED ORDER — RISPERIDONE 3 MG PO TABS: 3.0000 mg | ORAL_TABLET | Freq: Two times a day (BID) | ORAL | 1 refills | Status: DC

## 2023-09-30 MED ORDER — CHLORPROMAZINE HCL 100 MG PO TABS
100.0000 mg | ORAL_TABLET | Freq: Every day | ORAL | 1 refills | Status: DC
Start: 2023-09-30 — End: 2023-12-31

## 2023-09-30 NOTE — Progress Notes (Signed)
[facility] MD Virtual Progress Note   Patient Location: Home Provider Location: Home Office  I connect with patient by video and verified that I am speaking with correct person by using two identifiers. I discussed the limitations of evaluation and management by telemedicine and the availability of in person appointments. I also discussed with the patient that there may be a patient responsible charge related to this service. The patient expressed understanding and agreed to proceed.  Matthew Padilla [id number] 27 y.o.  09/30/2023 2:06 PM  History of Present Illness:  Patient was evaluated by video session.  He is taking Risperdal 6 mg a day and Thorazine 100 mg at bedtime.  He reported his paranoia is almost the same but maybe feeling a little more due to the holidays but no voices, agitation, anger.  He sleeps good.  Since we cut down the dose of the Risperdal he had lost a few pounds.  We have recommended CBC but he did not had a blood work.  He is in therapy with Angelina Ok at [facility] once a week.  We have recommended psychosocial rehabilitation but he was told by [facility] that there is nothing available at [facility]  He feels brother is not drinking or using any drugs.  He lives with his mother is very supportive.  He admitted most of the time he stays home and does not leave because of paranoia.  He has mild tremors and we have prescribed Cogentin but he stopped taking as tremors are not as intense and does not interfere in his daily activities.  Patient seen endocrinologist and had a blood work schedule.  He has anxiety but manageable as patient does not leave the house.  He is more alert and carry the conversation since Risperdal dose decreased.  Past Psychiatric History: H/O schizophrenia.  Inpatient at [facility] in July 2024 due to decompensation and n/c with meds, using mushroom. D/C on Thorazine, Risperdal, Gabapentin and Zoloft was discontinued. Olanzapine and  Seroquel was ineffective. H/O inpatient in 2019 at [facility] after having suicidal thoughts to crash his car but no intent.  D/C on REXULTI and Wellbutrin but stopped after 4 weeks as no changed. H/O inpatient in October 2022 at [facility] for bizarre behavior and psychosis.  Given Haldol but stopped due to excessive drooling and sedation.  H/O mania nd Psychosis.     Outpatient Encounter Medications as of 09/30/2023  Medication Sig   albuterol (VENTOLIN HFA) 108 (90 Base) MCG/ACT inhaler Inhale 2 puffs into the lungs every 6 (six) hours as needed for wheezing or shortness of breath.   benztropine (COGENTIN) 0.5 MG tablet Take 0.5 mg by mouth daily.   chlorproMAZINE (THORAZINE) 100 MG tablet Take 1 tablet (100 mg total) by mouth at bedtime.   clomiPHENE (CLOMID) 50 MG tablet Take 1 tablet (50 mg total) by mouth daily.   loratadine (CLARITIN) 10 MG tablet Take 1 tablet (10 mg total) by mouth daily.   metFORMIN (GLUCOPHAGE-XR) 500 MG 24 hr tablet Take 1 tablet (500 mg total) by mouth daily with breakfast.   risperidone (RISPERDAL) 3 MG tablet Take 1 tablet (3 mg total) by mouth 2 (two) times daily.   No facility-administered encounter medications on file as of 09/30/2023.    No results found for this or any previous visit (from the past 2160 hours).   Psychiatric Specialty Exam: Physical Exam  Review of Systems  Weight (!) 326 lb (147.9 kg).There is no height or weight on  file to calculate BMI.  General Appearance: Casual  Eye Contact:  Fair  Speech:  Slow  Volume:  Decreased  Mood:  Anxious and Dysphoric  Affect:  Constricted  Thought Process:  Descriptions of Associations: Intact  Orientation:  Full (Time, Place, and Person)  Thought Content:  Paranoid Ideation and Rumination  Suicidal Thoughts:  No  Homicidal Thoughts:  No  Memory:  Immediate;   Fair Recent;   Fair Remote;   Fair  Judgement:  Fair  Insight:  Shallow  Psychomotor Activity:  Decreased   Concentration:  Concentration: Fair and Attention Span: Fair  Recall:  [employer] of Knowledge:  Fair  Language:  Fair  Akathisia:  No  Handed:  Right  AIMS (if indicated):     Assets:  Communication Skills Desire for Improvement Housing Social Support  ADL's:  Intact  Cognition:  WNL  Sleep:  ok     Assessment/Plan: Schizophrenia, paranoid (HCC) - Plan: chlorproMAZINE (THORAZINE) 100 MG tablet, risperiDONE (RISPERDAL) 3 MG tablet  Anxiety - Plan: risperiDONE (RISPERDAL) 3 MG tablet  Tremor - Plan: risperiDONE (RISPERDAL) 3 MG tablet  Discussed underlying psychotic disorder.  He is more alert and active since Risperdal dose decrease.  He is taking 3 mg twice a day and Thorazine 100 mg at bedtime.  He used to take multiple medication but slowly and gradually it has been cut down.  I emphasized talk to the [facility] for any group therapy if available.  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  He was also stopped taking the medication but since taking the medication he is stable.  Recommended to keep the current medication for now.  Emphasized that he has to do the labs since he is taking the medication that require close monitoring her blood sugar.  Patient agreed with the plan.  His mother was not available today during this session.  Encouraged to call us back with any question or any concern.  Follow-up in 2 months.   Follow Up Instructions:     I discussed the assessment and treatment plan with the patient. The patient was provided an opportunity to ask questions and all were answered. The patient agreed with the plan and demonstrated an understanding of the instructions.   The patient was advised to call back or seek an in-person evaluation if the symptoms worsen or if the condition fails to improve as anticipated.    Collaboration of Care: Other provider involved in patient's care AEB notes are available in epic to  review.  Patient/Guardian was advised Release of Information must be obtained prior to any record release in order to collaborate their care with an outside provider. Patient/Guardian was advised if they have not already done so to contact the registration department to sign all necessary forms in order for [place] to release information regarding their care.   Consent: Patient/Guardian gives verbal consent for treatment and assignment of benefits for services provided during this visit. Patient/Guardian expressed understanding and agreed to proceed.     I provided 21 minutes of non face to face time during this encounter.  Note: This document was prepared by [employer] voice dictation technology and any errors that results from this process are unintentional.    Cleotis Nipper, MD 09/30/2023

## 2023-09-30 NOTE — Telephone Encounter (Signed)
Pt last seen on 08/05/23. Next visit scheduled for today @ 1400. FYI.

## 2023-10-12 ENCOUNTER — Encounter (HOSPITAL_COMMUNITY): Payer: Self-pay

## 2023-10-18 ENCOUNTER — Ambulatory Visit: Payer: 59 | Admitting: Internal Medicine

## 2023-12-02 ENCOUNTER — Telehealth (HOSPITAL_BASED_OUTPATIENT_CLINIC_OR_DEPARTMENT_OTHER): Payer: Self-pay | Admitting: Psychiatry

## 2023-12-02 ENCOUNTER — Encounter (HOSPITAL_COMMUNITY): Payer: Self-pay

## 2023-12-02 DIAGNOSIS — Z91199 Patient's noncompliance with other medical treatment and regimen due to unspecified reason: Secondary | ICD-10-CM

## 2023-12-02 NOTE — Progress Notes (Signed)
 Patient is no show

## 2023-12-04 ENCOUNTER — Other Ambulatory Visit (HOSPITAL_COMMUNITY): Payer: Self-pay | Admitting: Psychiatry

## 2023-12-04 DIAGNOSIS — F200 Paranoid schizophrenia: Secondary | ICD-10-CM

## 2023-12-04 DIAGNOSIS — R251 Tremor, unspecified: Secondary | ICD-10-CM

## 2023-12-04 DIAGNOSIS — F419 Anxiety disorder, unspecified: Secondary | ICD-10-CM

## 2023-12-22 ENCOUNTER — Telehealth (HOSPITAL_COMMUNITY): Payer: Self-pay | Admitting: Psychiatry

## 2023-12-22 NOTE — Telephone Encounter (Signed)
 I called and spoke with patients mother. She stopped the 100 mg Thorazine at night and the 3 mg risperidone at bedtime. She states patient is listless, and practically catatonic. Patient has a therapy appointment tomorrow with Fayrene Fearing. Patients mother will restart the 3 mg Risperidone at bedtime and he is still taking it in the morning. She will keep the may appointment but has asked that she be placed on the wait list. She is going to talk to patient again about an injectable

## 2023-12-23 ENCOUNTER — Encounter (HOSPITAL_COMMUNITY): Payer: Self-pay

## 2023-12-23 ENCOUNTER — Ambulatory Visit (INDEPENDENT_AMBULATORY_CARE_PROVIDER_SITE_OTHER): Admitting: Licensed Clinical Social Worker

## 2023-12-23 DIAGNOSIS — R251 Tremor, unspecified: Secondary | ICD-10-CM

## 2023-12-23 DIAGNOSIS — F419 Anxiety disorder, unspecified: Secondary | ICD-10-CM

## 2023-12-23 DIAGNOSIS — F200 Paranoid schizophrenia: Secondary | ICD-10-CM | POA: Diagnosis not present
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
## 2023-12-27 ENCOUNTER — Other Ambulatory Visit: Payer: 59

## 2023-12-30 LAB — TESTOSTERONE, TOTAL, LC/MS/MS: Testosterone, Total, LC-MS-MS: 654 ng/dL (ref 250–1100)

## 2023-12-30 LAB — LUTEINIZING HORMONE: LH: 14.2 m[IU]/mL — ABNORMAL HIGH (ref 1.5–9.3)

## 2023-12-30 LAB — TSH: TSH: 2.72 m[IU]/L (ref 0.40–4.50)

## 2023-12-30 LAB — T4, FREE: Free T4: 1.5 ng/dL (ref 0.8–1.8)

## 2023-12-30 LAB — PROLACTIN: Prolactin: 25.5 ng/mL — ABNORMAL HIGH (ref 2.0–18.0)

## 2023-12-31 ENCOUNTER — Encounter (HOSPITAL_COMMUNITY): Payer: Self-pay | Admitting: Psychiatry

## 2023-12-31 ENCOUNTER — Telehealth (HOSPITAL_COMMUNITY): Admitting: Psychiatry

## 2023-12-31 VITALS — Wt 326.0 lb

## 2023-12-31 DIAGNOSIS — F200 Paranoid schizophrenia: Secondary | ICD-10-CM | POA: Diagnosis not present

## 2023-12-31 DIAGNOSIS — F4310 Post-traumatic stress disorder, unspecified: Secondary | ICD-10-CM

## 2023-12-31 DIAGNOSIS — R251 Tremor, unspecified: Secondary | ICD-10-CM | POA: Diagnosis not present

## 2023-12-31 MED ORDER — RISPERIDONE 3 MG PO TABS
3.0000 mg | ORAL_TABLET | Freq: Two times a day (BID) | ORAL | 1 refills | Status: DC
Start: 2023-12-31 — End: 2024-03-02

## 2023-12-31 MED ORDER — HYDROXYZINE HCL 25 MG PO TABS
25.0000 mg | ORAL_TABLET | Freq: Every evening | ORAL | 1 refills | Status: DC | PRN
Start: 2023-12-31 — End: 2024-03-02
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
## 2024-01-05 ENCOUNTER — Ambulatory Visit (INDEPENDENT_AMBULATORY_CARE_PROVIDER_SITE_OTHER): Admitting: Licensed Clinical Social Worker

## 2024-01-05 DIAGNOSIS — F200 Paranoid schizophrenia: Secondary | ICD-10-CM

## 2024-01-05 DIAGNOSIS — R251 Tremor, unspecified: Secondary | ICD-10-CM | POA: Diagnosis not present

## 2024-01-05 DIAGNOSIS — F419 Anxiety disorder, unspecified: Secondary | ICD-10-CM | POA: Diagnosis not present

## 2024-01-05 NOTE — Progress Notes (Signed)
 THERAPIST PROGRESS NOTE   Session Date: 01/05/2024  Session Time: 1305 - 1349  Virtual Visit via Video Note  I connected with [facility] on 01/05/24 at  1:00 PM EDT by a video enabled telemedicine application and verified that I am speaking with the correct person using two identifiers.  Location: Patient: Home Provider: [facility] [place] Office   I discussed the limitations of evaluation and management by telemedicine and the availability of in person appointments. The patient expressed understanding and agreed to proceed.  I discussed the assessment and treatment plan with the patient. The patient was provided an opportunity to ask questions and all were answered. The patient agreed with the plan and demonstrated an understanding of the instructions.   The patient was advised to call back or seek an in-person evaluation if the symptoms worsen or if the condition fails to improve as anticipated.  I provided 44 minutes of non-face-to-face time during this encounter.  Participation Level: Minimal  Behavioral Response: CasualAlertAnxious  Type of Therapy: Individual Therapy  Treatment Goals addressed:  Active    Anxiety    STG: Report a decrease in anxiety symptoms as evidenced by an overall reduction in anxiety score by a minimum of 25% on the Generalized Anxiety Disorder Scale (GAD-7)     LTG: "Be able to sleep through the night and not be up worrying so much about my future"       ProgressTowards Goals: Initial  Interventions: CBT, Motivational Interviewing, and Supportive  Summary: [facility] is a 28 y.o. male with past psych history of Schizophrenia and Anxiety, presenting for follow-up therapy session in efforts to improve management of anxiousness, paranoia, and depressive symptoms.   Patient was minimally engaged in session, presenting and increasingly anxious moods and congruent affect, proving to interfere in engagement, providing brief 1-3 word responses throughout  duration of visit.  Patient engaged in introductory check-in, sharing of doing "okay", sharing details of daily events when prompted, noting of having helped mom with yard work earlier today, sharing of having found activities enjoyable.  Engaged in reassessment of presenting depressive and anxious symptoms via PHQ-9 and GAD-7, further processing current scores, and variances in scores and observed symptoms.  Patient noted of having been worrying less, but also feeling increased in nervousness and anxiousness with increased paranoia.  Patient expressed feeling things have been more hectic over the past 2 weeks, proving unable to identify specifics, noting of routine having not changed and things having been constant for the past 2 weeks. Actively explored pt's individual efforts at managing reported anxiousness, noting of having not explored efforts outside of playing games on his phone. Further processed prior use of listening to music and potential benefits worth exploring again. Pt further detailed experienced depressive sxs, noting of worsening sleep difficulties making him feel more tire throughout the day, proving to impact mood, expressing of "being grouchier in the mornings and taking longer to get moving". Pt briefly noted of feeling of letting himself or others down due, sharing of feeling of "haven't really accomplished anything in life or done anything significant" having been reflecting on his own, and thinking more of going back to school and experiencing thoughts of needing to be educated and get a degree. Pt proved open to encouragements to spend time further exploring skills and activities that will support in increasing relaxation and calmness.  Patient responded well to interventions. Patient continues to meet criteria for Schizophrenia, and Anxiety. Patient will continue to benefit from engagement in outpatient  therapy due to being the least restrictive service to meet presenting needs.       01/05/2024    1:13 PM 12/23/2023   10:23 AM 07/12/2023   11:18 AM 05/24/2023   10:03 AM 01/05/2023   12:35 PM  Depression screen PHQ 2/9  Decreased Interest 3 3 1 2  0  Down, Depressed, Hopeless 2 2 1 2  0  PHQ - 2 Score 5 5 2 4  0  Altered sleeping 3 2 2 2    Tired, decreased energy 1 2 2 2    Change in appetite 0 1 0 1   Feeling bad or failure about yourself  3 2 2 2    Trouble concentrating 2 1 2 2    Moving slowly or fidgety/restless 1 0 2 3   Suicidal thoughts 0 0 0 0   PHQ-9 Score 15 13 12 16    Difficult doing work/chores Very difficult Very difficult Not difficult at all Very difficult       01/05/2024    1:12 PM 12/23/2023   10:22 AM 07/12/2023   11:18 AM 05/24/2023   10:03 AM  GAD 7 : Generalized Anxiety Score  Nervous, Anxious, on Edge 3 2 0 2  Control/stop worrying 2 2 0 2  Worry too much - different things 1 2 0 2  Trouble relaxing 2 2 0 2  Restless 0 0 0 2  Easily annoyed or irritable 0 0 0 3  Afraid - awful might happen 3 3 0 2  Total GAD 7 Score 11 11 0 15  Anxiety Difficulty Very difficult Very difficult Not difficult at all Very difficult     Suicidal/Homicidal: Nowithout intent/plan  Therapist Response: Clinician utilized CBT, MI, and supportive reflection interventions to address reported sxs and identified challenges.   Clinician actively greeted pt upon connected for virtual visit, actively engaging pt in check-in, assessing presenting moods and affect, prompting pt's engagement in review of presenting moods and daily events. Actively engaged pt in re-assessing of depressive and anxious sxs experienced since previous visit via PHQ-9 and GAD-7, further collaborating with pt in exploration of current scores, comparison to previous screenings, and variances in reported sxs.  Utilized open-ended questions and efforts to further elicit patient recounts of recent events, actively listening to patient's reflection of the events, further prompting patient in exploration of  experience depressive and anxious symptoms and individual efforts towards employing various skills and techniques and aims of managing presenting symptoms and/or stressors.  Utilized MI approaches and aims of exploring patient's readiness and/or interest in practicing ultimate skills as a means of managing increased anxiousness and paranoia.  Encouraged pt over coming weeks to spend time exploring skills and activities that provide relaxation and calmness to support in management of stress.  Clinician reassessed severity of presenting sxs, and presence of any safety concerns. Clinician provided support and empathy to patient during session.  Plan: Return again in 2 weeks.  Diagnosis:  Encounter Diagnoses  Name Primary?   Schizophrenia, paranoid (HCC) Yes   Anxiety    Tremor     Collaboration of Care: Psychiatrist AEB provider documentation available in EHR.  Patient/Guardian was advised Release of Information must be obtained prior to any record release in order to collaborate their care with an outside provider. Patient/Guardian was advised if they have not already done so to contact the registration department to sign all necessary forms in order for [place] to release information regarding their care.   Consent: Patient/Guardian gives verbal consent for treatment and  assignment of benefits for services provided during this visit. Patient/Guardian expressed understanding and agreed to proceed.   Leisa Lenz, MSW, LCSW 01/05/2024,  1:15 PM

## 2024-01-14 ENCOUNTER — Ambulatory Visit: Payer: 59 | Admitting: Internal Medicine

## 2024-01-14 NOTE — Progress Notes (Deleted)
 Name: [facility]illa  MRN/ DOB: [id number], 20-Dec-1995    Age/ Sex: 28 y.o., male    PCP: Ardith Dark, MD   Reason for Endocrinology Evaluation: Hypogonadism      Date of Initial Endocrinology Evaluation: 01/14/2021    HPI: [name] is a 28 y.o. male with a past medical history of Depression . The patient presented for initial endocrinology clinic visit on 01/14/2021 for consultative assistance with his hypogonadism .    He presented to his PCP in 10/2020  For evaluation of severe depression, lack of motivation and mood changes. Mother asked for evaluation for klinefelter syndrome  Which prompted lab work showing normal TSH 1.98 uIU/mL , low total testosterone at 204 ng/dL ( reference  [id number])  but normal free testosterone 14.4 pg/dL ( reference 9.1-47.8) normal LH and FSH at 6.7 and 1.5 respectively.    Of note the pt was first noted with depression when he entered [place] [employer] at age 27 which resulted in expelling him due to the fact that he stopped going to classes. He was not treated for depression until a couple years later when he was admitted for in-patient facility, pt was non compliant with anti depressants after discharge.     Moved from [place] ~2021 No learning difficulties  He went through puberty at age 26     Has two sisters    Was lost to follow-up from 2022 until his return to our office December 2023   24-hour urinary cortisol was normal at 43.1 mcg, total testosterone low at 115 NG/DL, prolactin slightly elevated at 22.3 NG/mL, with inappropriately normal LH 2.57 mIU/mL and normal TFTs   MRI of the brain showed normal pituitary gland 10/2022  Patient started on clomiphene 10/2022    SUBJECTIVE:    Today (01/14/24):  [facility] is here for further evaluation of hypogonadism.  Patient continues to follow-up with psychiatry for paranoid schizophrenia    Patient has been noted with weight loss Continues with walking   Energy level continues to be low  Not working at [employer] anymore  He is not in school at [employer]  Has noted improved facial Hair growth   Had recent constipation but no diarrhea  Denies headaches or changes in vision ( Hx of lasik)    Clomiphene 50 mg daily  HISTORY:  Past Medical History:  Past Medical History:  Diagnosis Date   Anxiety    Low testosterone in male    Paranoid schizophrenia (HCC)    Past Surgical History:   Social History:  reports that he has never smoked. He has never used smokeless tobacco. He reports that he does not currently use alcohol. He reports current drug use. Drug: Other-see comments. Family History: family history is not on file.   HOME MEDICATIONS: Allergies as of 01/14/2024   No Known Allergies      Medication List        Accurate as of January 14, 2024  7:09 AM. If you have any questions, ask your nurse or doctor.          albuterol 108 (90 Base) MCG/ACT inhaler Commonly known as: VENTOLIN HFA Inhale 2 puffs into the lungs every 6 (six) hours as needed for wheezing or shortness of breath.   clomiPHENE 50 MG tablet Commonly known as: CLOMID Take 1 tablet (50 mg total) by mouth daily.   hydrOXYzine 25 MG tablet Commonly known as: ATARAX Take 1 tablet (25 mg total) by mouth at bedtime  and may repeat dose one time if needed.   loratadine 10 MG tablet Commonly known as: CLARITIN Take 1 tablet (10 mg total) by mouth daily.   metFORMIN 500 MG 24 hr tablet Commonly known as: GLUCOPHAGE-XR Take 1 tablet (500 mg total) by mouth daily with breakfast.   risperiDONE 3 MG tablet Commonly known as: RISPERDAL Take 1 tablet (3 mg total) by mouth 2 (two) times daily.          REVIEW OF SYSTEMS: A comprehensive ROS was conducted with the patient and is negative except as per HPI     OBJECTIVE:  VS: There were no vitals taken for this visit.    Wt Readings from Last 3 Encounters:  09/15/23 (!) 330 lb (149.7 kg)  07/12/23 (!) 340  lb 3.2 oz (154.3 kg)  05/24/23 (!) 371 lb (168.3 kg)     EXAM: General:  NAD  Lungs: Clear with good BS bilat   Heart: Auscultation: RRR.  Extremities:  BL LE: No pretibial edema normal   Mental Status: Judgment, insight: Intact     DATA REVIEWED:   Latest Reference Range & Units 12/27/23 08:18  LH 1.5 - 9.3 mIU/mL 14.2 (H)  Prolactin 2.0 - 18.0 ng/mL 25.5 (H)    Latest Reference Range & Units 12/27/23 08:18  Testosterone, Total, LC-MS-MS 250 - 1,100 ng/dL 401    Latest Reference Range & Units 12/27/23 08:18  TSH 0.40 - 4.50 mIU/L 2.72  T4,Free(Direct) 0.8 - 1.8 ng/dL 1.5    MRI brain 0/27/2536   EXAM: MRI HEAD WITHOUT CONTRAST   TECHNIQUE: Multiplanar, multiecho pulse sequences of the brain and surrounding structures were obtained without intravenous contrast.   COMPARISON:  None Available.   FINDINGS: Brain: Normal size and shape of the pituitary gland. Normal appearance of the infundibulum, suprasellar cistern, and cavernous sinus region.   No infarct, hemorrhage, hydrocephalus, or collection. No white matter disease   Vascular: Major flow voids are preserved   Skull and upper cervical spine: Normal marrow signal   Sinuses/Orbits: Negative   IMPRESSION: Normal noncontrast MRI of the brain and pituitary gland.    ASSESSMENT/PLAN/RECOMMENDATIONS:   Low total serum testosterone:  -Patient has been on clomiphene since January 2024 and has noted clinical improvement, this is most likely due to uncontrolled paranoid schizophrenia and having to be on multiple psychiatric medications which was recently increased and the dose. -He has not been on clomiphene in approximately 6 months -I did explain to the patient and parents the importance of optimizing testosterone levels in the setting of young age, and elevated prolactin level for general wellbeing, quality of life, and bone health  Clomiphene 50 mg daily   2.  Hyperprolactinemia:  -Patient on  multiple antipsychotic medications that would cause an increase in prolactin level -No intervention -No symptoms -MRI was normal 10/2022 -I have strongly discouraged the patient from using cannabis products   Follow-up in 4 months     Signed electronically by: Lyndle Herrlich, MD  [facility] Endocrinology  [facility] [address] [place] [place], [place] [address] Phone: [phone] FAX: [phone]   CC: Ardith Dark, MD [address] [place] [address] Phone: [phone] Fax: [phone]   Return to Endocrinology clinic as below: Future Appointments  Date Time Provider Department Center  01/14/2024 10:10 AM Brighid Koch, Konrad Dolores, MD [facility] None  01/18/2024  2:00 PM Leisa Lenz, LCSW [facility] None  01/31/2024  2:00 PM Leisa Lenz, LCSW [facility] None  02/14/2024  2:00 PM Leisa Lenz, LCSW [facility] None  02/25/2024 10:20 AM Arfeen, Phillips Grout, MD [facility] None  02/28/2024  2:00 PM Leisa Lenz, LCSW [facility] None  03/02/2024 11:30 AM Arfeen, Phillips Grout, MD [facility] None  05/25/2024 10:00 AM Ardith Dark, MD [facility]

## 2024-01-18 ENCOUNTER — Ambulatory Visit (HOSPITAL_COMMUNITY): Admitting: Licensed Clinical Social Worker

## 2024-01-18 DIAGNOSIS — F200 Paranoid schizophrenia: Secondary | ICD-10-CM | POA: Diagnosis not present

## 2024-01-18 DIAGNOSIS — F419 Anxiety disorder, unspecified: Secondary | ICD-10-CM

## 2024-01-18 DIAGNOSIS — R251 Tremor, unspecified: Secondary | ICD-10-CM

## 2024-01-18 NOTE — Progress Notes (Signed)
 THERAPIST PROGRESS NOTE   Session Date: 01/18/2024  Session Time: 1405 - 1448  Virtual Visit via Video Note  I connected with Matthew Padilla on 01/18/24 at  2:00 PM EDT by a video enabled telemedicine application and verified that I am speaking with the correct person using two identifiers.  Location: Patient: Home Provider: Home Office   I discussed the limitations of evaluation and management by telemedicine and the availability of in person appointments. The patient expressed understanding and agreed to proceed.  I discussed the assessment and treatment plan with the patient. The patient was provided an opportunity to ask questions and all were answered. The patient agreed with the plan and demonstrated an understanding of the instructions.   The patient was advised to call back or seek an in-person evaluation if the symptoms worsen or if the condition fails to improve as anticipated.  I provided 43 minutes of non-face-to-face time during this encounter.  Participation Level: Active  Behavioral Response: CasualAlertAnxious  Type of Therapy: Individual Therapy  Treatment Goals addressed:  Active    Anxiety    STG: Report a decrease in anxiety symptoms as evidenced by an overall reduction in anxiety score by a minimum of 25% on the Generalized Anxiety Disorder Scale (GAD-7)     LTG: "Be able to sleep through the night and not be up worrying so much about my future"       ProgressTowards Goals: Progressing  Interventions: CBT, Motivational Interviewing, and Supportive  Summary: Matthew Padilla is a 28 y.o. male with past psych history of Schizophrenia and Anxiety, presenting for follow-up therapy session in efforts to improve management of anxiousness, paranoia, and depressive symptoms.   Patient proved moderately engaged in today's visit, presenting and anxious moods and congruent affect, proving to become more relaxed and increasingly engaged throughout progression of visit.   Patient engaged in introductory check-in, sharing of things going "pretty good I guess", further detailing of their not having been much going on over the past 2 weeks, specifically noting of there being minimal change in environmental factors and occurrences.  Patient further affirmed transitions and disruptions to routine and/or what patient considers typical, to be distressing.  Actively engaged in reassessing presenting depressive and anxious symptoms via PHQ-9 and GAD-7, noting of maintained moderate anxiety score with variances in observed symptoms, and increased depressive score with further reports of observations of increased symptoms.  Patient detailed having observed improvements in sleep however increased feelings of fatigue, processing impact on other symptoms, endorsing increased lack of interest and feeling down.  Actively engaged in processing history of PCP visits, reporting of not having seen PCP in approximately 6 to 12 months, actively processing benefits to scheduling visit to further explore concerns surrounding sleep and potential vitamin D concerns related to chronic fatigue.  Actively engaged in reflection of recent events, sharing of having spent time outside in the yard yesterday laying moped finding this to be enjoyable.  Patient expressed continued difficulties in getting moving and/or finding motivation to start day, feeling of having little to get up for, processing ways in which patient believes he could improve these factors, identifying going to bed earlier and making sure to eat breakfast daily consistently.  Explored patient's interest in developing daily/weekly routine/schedule to support in development of structure and routine to increase engagement in activities and/or activity level, to further address stagnation.  Patient responded well to interventions. Patient continues to meet criteria for Schizophrenia, and Anxiety. Patient will continue to benefit from engagement in  outpatient therapy due to being the least restrictive service to meet presenting needs.      01/18/2024    2:10 PM 01/05/2024    1:13 PM 12/23/2023   10:23 AM 07/12/2023   11:18 AM 05/24/2023   10:03 AM  Depression screen PHQ 2/9  Decreased Interest 3 3 3 1 2   Down, Depressed, Hopeless 3 2 2 1 2   PHQ - 2 Score 6 5 5 2 4   Altered sleeping 2 3 2 2 2   Tired, decreased energy 3 1 2 2 2   Change in appetite 1 0 1 0 1  Feeling bad or failure about yourself  3 3 2 2 2   Trouble concentrating 2 2 1 2 2   Moving slowly or fidgety/restless 0 1 0 2 3  Suicidal thoughts 0 0 0 0 0  PHQ-9 Score 17 15 13 12 16   Difficult doing work/chores Very difficult Very difficult Very difficult Not difficult at all Very difficult      01/18/2024    2:07 PM 01/05/2024    1:12 PM 12/23/2023   10:22 AM 07/12/2023   11:18 AM  GAD 7 : Generalized Anxiety Score  Nervous, Anxious, on Edge 1 3 2  0  Control/stop worrying 3 2 2  0  Worry too much - different things 3 1 2  0  Trouble relaxing 1 2 2  0  Restless 0 0 0 0  Easily annoyed or irritable 0 0 0 0  Afraid - awful might happen 3 3 3  0  Total GAD 7 Score 11 11 11  0  Anxiety Difficulty Very difficult Very difficult Very difficult Not difficult at all     Suicidal/Homicidal: Nowithout intent/plan  Therapist Response: Clinician utilized CBT, MI, and supportive reflection interventions to address reported sxs and identified challenges.   Clinician actively greeted patient upon connection for virtual visit, engaging in introductory check-in, assessing presenting moods and affect and further prompting patient's reflection of daily events and factors contributing to presenting moods.  Actively engage patient in reassessing experience depressive and anxious symptoms over the past 2 weeks via PHQ-9 and GAD-7, further processing screening score results with patient, noting of no change in anxiety severity but observed changes in present symptoms, and increased depressive score  and multiple variances in observed depressive symptoms.  Utilized open-ended questions to further elicit patient recounts of recent events, actively listening to patient reflections and reports of improved sleep with addition of hydroxyzine, however noting of increased fatigue over recent weeks, exploring patient's thoughts and feelings in relation to fatigue proving to decrease interest in activity and feeling down.  Engage patient in processing understanding of importance of structure and formation of habits in aiding the establishment of routine, processing patient's interest in doing so to aid in reduction of anhedonia, avoidance, and stagnation, in turn proving to support and fatigue, improved moods, and healthy weight management.  Encouraged pt over coming weeks to spend time exploring skills and activities that provide relaxation and calmness to support in management of stress.  Clinician reassessed severity of presenting sxs, and presence of any safety concerns. Clinician provided support and empathy to patient during session.  Plan: Return again in 2 weeks.  Diagnosis:  Encounter Diagnoses  Name Primary?   Schizophrenia, paranoid (HCC) Yes   Anxiety    Tremor     Collaboration of Care: Psychiatrist AEB provider documentation available in EHR.  Patient/Guardian was advised Release of Information must be obtained prior to any record release in order to collaborate their  care with an outside provider. Patient/Guardian was advised if they have not already done so to contact the registration department to sign all necessary forms in order for us  to release information regarding their care.   Consent: Patient/Guardian gives verbal consent for treatment and assignment of benefits for services provided during this visit. Patient/Guardian expressed understanding and agreed to proceed.   Patsi Boots, MSW, LCSW 01/18/2024,  2:13 PM

## 2024-01-31 ENCOUNTER — Ambulatory Visit (HOSPITAL_COMMUNITY): Admitting: Licensed Clinical Social Worker

## 2024-01-31 DIAGNOSIS — R251 Tremor, unspecified: Secondary | ICD-10-CM | POA: Diagnosis not present

## 2024-01-31 DIAGNOSIS — F200 Paranoid schizophrenia: Secondary | ICD-10-CM | POA: Diagnosis not present

## 2024-01-31 DIAGNOSIS — F419 Anxiety disorder, unspecified: Secondary | ICD-10-CM | POA: Diagnosis not present

## 2024-01-31 NOTE — Progress Notes (Unsigned)
 THERAPIST PROGRESS NOTE   Session Date: 01/31/2024  Session Time: 1406 - 1442  Virtual Visit via Video Note  I connected with Matthew Padilla on 01/31/24 at  2:06 PM EDT by a video enabled telemedicine application and verified that I am speaking with the correct person using two identifiers.  Location: Patient: Home Provider: Home Office   I discussed the limitations of evaluation and management by telemedicine and the availability of in person appointments. The patient expressed understanding and agreed to proceed.  The patient was advised to call back or seek an in-person evaluation if the symptoms worsen or if the condition fails to improve as anticipated.  I provided 35 minutes of non-face-to-face time during this encounter.  Participation Level: Active  Behavioral Response: CasualAlertAnxious  Type of Therapy: Individual Therapy  Treatment Goals addressed:  Active    Anxiety    STG: Report a decrease in anxiety symptoms as evidenced by an overall reduction in anxiety score by a minimum of 25% on the Generalized Anxiety Disorder Scale (GAD-7)     LTG: "Be able to sleep through the night and not be up worrying so much about my future"       ProgressTowards Goals: Progressing  Interventions: CBT, Motivational Interviewing, and Supportive  Summary: Matthew Padilla is a 28 y.o. male with past psych history of Schizophrenia and Anxiety, presenting for follow-up therapy session in efforts to improve management of anxiousness, paranoia, and depressive symptoms.   Patient actively engaged in session, presenting in increasingly anxious moods and congruent affect, engaging in introductory check-in, reporting of no significant distress.  Actively engaged in reassessing presenting depressive and anxious symptoms via PHQ-9 and GAD-7, processing observed increase in anxious symptoms throughout recent weeks, sharing of having noticed increased difficulties resting and falling asleep, and  increased challenges navigating day to day tasks over the past 2 weeks.  Attempted to identify/explore specific daily factors proving to be increasingly stressful, proving unable to identify specifics.  Actively explored individual efforts at managing stressors, sharing of having attempted to manage stressors by watching TV and listening to music, further processing effectiveness, noting of listening to music having helped a little, while only utilizing 1x, expressing will attempt to do again to obtain same benefits. Explored efforts at adding routine to daily schedule, processing specific actions, sharing of having been setting alarm to get up and begin day, finding this has been beneficial to help maintain hygiene. Explored options of adding daily walks to afternoons to increase time outside and physical activity, and interest in adding time reading to daily routine. Further processed ways in which pt may incorporate activities into routine.   Patient responded well to interventions. Patient continues to meet criteria for Schizophrenia, and Anxiety. Patient will continue to benefit from engagement in outpatient therapy due to being the least restrictive service to meet presenting needs.      01/31/2024    2:13 PM 01/18/2024    2:10 PM 01/05/2024    1:13 PM 12/23/2023   10:23 AM 07/12/2023   11:18 AM  Depression screen PHQ 2/9  Decreased Interest 2 3 3 3 1   Down, Depressed, Hopeless 2 3 2 2 1   PHQ - 2 Score 4 6 5 5 2   Altered sleeping 3 2 3 2 2   Tired, decreased energy 3 3 1 2 2   Change in appetite 1 1 0 1 0  Feeling bad or failure about yourself  3 3 3 2 2   Trouble concentrating 2 2 2 1  2  Moving slowly or fidgety/restless 1 0 1 0 2  Suicidal thoughts 0 0 0 0 0  PHQ-9 Score 17 17 15 13 12   Difficult doing work/chores Very difficult Very difficult Very difficult Very difficult Not difficult at all      01/31/2024    2:10 PM 01/18/2024    2:07 PM 01/05/2024    1:12 PM 12/23/2023   10:22 AM  GAD 7  : Generalized Anxiety Score  Nervous, Anxious, on Edge 3 1 3 2   Control/stop worrying 3 3 2 2   Worry too much - different things 2 3 1 2   Trouble relaxing 2 1 2 2   Restless 1 0 0 0  Easily annoyed or irritable 1 0 0 0  Afraid - awful might happen 3 3 3 3   Total GAD 7 Score 15 11 11 11   Anxiety Difficulty Very difficult Very difficult Very difficult Very difficult     Suicidal/Homicidal: Nowithout intent/plan  Therapist Response: Clinician utilized CBT, MI, and supportive reflection interventions to address reported sxs and identified challenges.   Clinician actively greeted patient upon connecting to virtual appointment, engaging patient in introductory check-in, assessing presenting moods and affect, and further prompting patient reflection of recent events and factors contributing to presenting moods.  Actively listened to patient's reflections of recent events, utilizing open-ended questions to further elicit patient's thoughts and feelings surrounding recent events and expressed/identified challenges and/or stressors.  Engage patient in reassessing presenting depressive and anxious symptoms experienced over the past 2 weeks via PHQ-9 and GAD-7, further engaging patient in greater exploration of anxious symptoms through use of open-ended questions.  Utilized Socratic questioning to further prompt critical thinking surrounding stressors and challenges proving problematic for patient.  Utilize CBT and MI interventions to explore patient's thoughts and feelings surrounding noted challenges, efforts at managing presenting stressors, and patient's readiness and/or interest in implementing various skills to assist self and managing stressors.  Encouraged pt over coming weeks to spend time exploring skills and activities that provide relaxation and calmness to support in management of stress.  Clinician reassessed severity of presenting sxs, and presence of any safety concerns. Clinician provided  support and empathy to patient during session.  Plan: Return again in 2 weeks.  Diagnosis:  Encounter Diagnoses  Name Primary?   Schizophrenia, paranoid (HCC) Yes   Anxiety    Tremor     Collaboration of Care: Psychiatrist AEB provider documentation available in EHR.  Patient/Guardian was advised Release of Information must be obtained prior to any record release in order to collaborate their care with an outside provider. Patient/Guardian was advised if they have not already done so to contact the registration department to sign all necessary forms in order for us  to release information regarding their care.   Consent: Patient/Guardian gives verbal consent for treatment and assignment of benefits for services provided during this visit. Patient/Guardian expressed understanding and agreed to proceed.   Patsi Boots, MSW, LCSW 01/31/2024,  2:14 PM

## 2024-02-14 ENCOUNTER — Ambulatory Visit (INDEPENDENT_AMBULATORY_CARE_PROVIDER_SITE_OTHER): Admitting: Licensed Clinical Social Worker

## 2024-02-14 DIAGNOSIS — F200 Paranoid schizophrenia: Secondary | ICD-10-CM | POA: Diagnosis not present

## 2024-02-14 DIAGNOSIS — R251 Tremor, unspecified: Secondary | ICD-10-CM | POA: Diagnosis not present

## 2024-02-14 DIAGNOSIS — F419 Anxiety disorder, unspecified: Secondary | ICD-10-CM

## 2024-02-14 NOTE — Progress Notes (Signed)
 THERAPIST PROGRESS NOTE   Session Date: 02/14/2024  Session Time: 1405 - 1445  Virtual Visit via Video Note  I connected with Matthew Padilla on 02/14/24 at  2:05 PM EDT by a video enabled telemedicine application and verified that I am speaking with the correct person using two identifiers.  Location: Patient: Home Provider: Home Office   I discussed the limitations of evaluation and management by telemedicine and the availability of in person appointments. The patient expressed understanding and agreed to proceed.  The patient was advised to call back or seek an in-person evaluation if the symptoms worsen or if the condition fails to improve as anticipated.  I provided 39 minutes of non-face-to-face time during this encounter.  Participation Level: Active  Behavioral Response: CasualAlertAnxious  Type of Therapy: Individual Therapy  Treatment Goals addressed:   STG: Report a decrease in anxiety symptoms as evidenced by an overall reduction in anxiety score by a minimum of 25% on the Generalized Anxiety Disorder Scale (GAD-7)     LTG: "Be able to sleep through the night and not be up worrying so much about my future"       ProgressTowards Goals: Progressing  Interventions: CBT, Motivational Interviewing, and Supportive  Summary: Matthew Padilla is a 28 y.o. male with past psych history of Schizophrenia and Anxiety, presenting for follow-up therapy session in efforts to improve management of anxiousness, paranoia, and depressive symptoms.   Patient actively engaged in session, presenting in increasingly anxious moods and congruent affect, engaging in introductory check-in, reporting of "things have been okay" further detailing of having improved sleep, finding self sleeping at least 8 hours, varying from night to night but generally consistent, feeling less tired overall and finding self having a better mood throughout the day. Explored additional efforts to increase structure and  routine, finding self not engaging in any other planned/routine tasks. Consistently waking/getting up, eating breakfast, followed by making bed and completing ADLs, then watching TV for some time. Processed currently being in pre-contemplation stage of adding physical activity to daily routine such as including afternoon walks, shared of potential to increase interest in activating behaviors if finding self gaining more weight. Further detailed diet of past two weeks, noting of mostly eating cereal in the morning, sandwiches for lunch and whatever parents decide for dinner. Explored recent experience with presenting sxs of anxiousness, paranoia, etc., sharing of having experienced feelings of paranoia nearly every day that someone is attempting to get pt and family, processed how pt is proving to manage such thoughts by trying to talk self through, "Asking the who, what, when, where, and why" questions, and that it's not really logical or a real threat, feels helping some. Actively engaged in exploring 'Challenging Anxious Thoughts' technique to support pt in managing anxious thoughts and paranoia, processing the worst, best, and likely outcomes of situations and processing the evidence to support.  Patient responded well to interventions. Patient continues to meet criteria for Schizophrenia, and Anxiety. Patient will continue to benefit from engagement in outpatient therapy due to being the least restrictive service to meet presenting needs.      01/31/2024    2:13 PM 01/18/2024    2:10 PM 01/05/2024    1:13 PM 12/23/2023   10:23 AM 07/12/2023   11:18 AM  Depression screen PHQ 2/9  Decreased Interest 2 3 3 3 1   Down, Depressed, Hopeless 2 3 2 2 1   PHQ - 2 Score 4 6 5 5 2   Altered sleeping 3 2 3  2  2  Tired, decreased energy 3 3 1 2 2   Change in appetite 1 1 0 1 0  Feeling bad or failure about yourself  3 3 3 2 2   Trouble concentrating 2 2 2 1 2   Moving slowly or fidgety/restless 1 0 1 0 2  Suicidal  thoughts 0 0 0 0 0  PHQ-9 Score 17 17 15 13 12   Difficult doing work/chores Very difficult Very difficult Very difficult Very difficult Not difficult at all      01/31/2024    2:10 PM 01/18/2024    2:07 PM 01/05/2024    1:12 PM 12/23/2023   10:22 AM  GAD 7 : Generalized Anxiety Score  Nervous, Anxious, on Edge 3 1 3 2   Control/stop worrying 3 3 2 2   Worry too much - different things 2 3 1 2   Trouble relaxing 2 1 2 2   Restless 1 0 0 0  Easily annoyed or irritable 1 0 0 0  Afraid - awful might happen 3 3 3 3   Total GAD 7 Score 15 11 11 11   Anxiety Difficulty Very difficult Very difficult Very difficult Very difficult     Suicidal/Homicidal: Nowithout intent/plan  Therapist Response: Clinician utilized CBT, MI, and supportive reflection interventions to address reported sxs and identified challenges.   Clinician actively greeted patient upon joining virtual visit, engaging pt in introductory check-in, assessing presenting moods and affect, eliciting pt's reflections of recent events and factors contributing to presenting moods.  Clinician actively listened to the patient's reflections of events, utilizing open-ended questions to further evoke details of recent experiences and patient's thoughts and feelings related to identified observed improvements.  Utilized Socratic questioning to greater encourage patient's critical thinking in relation to improved sleep patterns and having increased consistency with daily routine surrounding bedtime, wake time, morning routine and completion of ADLs.  Provided patient support and validating express thoughts and feelings in relation to individual perspective of overall progressions.  Utilized MI and supportive reflection interventions to process patient's readiness in relation to behavioral activation techniques for increasing physical activity and adding daily walks to routine.  Utilized CBT interventions to include 'challenging anxious thoughts' handout to  further support patient in navigating anxious thoughts and paranoia surrounding leaving the home.  Clinician reassessed severity of presenting sxs, and presence of any safety concerns. Clinician provided support and empathy to patient during session.  Plan: Return again in 2 weeks.  Diagnosis:  Encounter Diagnoses  Name Primary?   Schizophrenia, paranoid (HCC) Yes   Anxiety    Tremor     Collaboration of Care: Psychiatrist AEB provider documentation available in EHR.  Patient/Guardian was advised Release of Information must be obtained prior to any record release in order to collaborate their care with an outside provider. Patient/Guardian was advised if they have not already done so to contact the registration department to sign all necessary forms in order for us  to release information regarding their care.   Consent: Patient/Guardian gives verbal consent for treatment and assignment of benefits for services provided during this visit. Patient/Guardian expressed understanding and agreed to proceed.   Patsi Boots, MSW, LCSW 02/14/2024,  2:06 PM

## 2024-02-25 ENCOUNTER — Telehealth (HOSPITAL_COMMUNITY): Admitting: Psychiatry

## 2024-02-27 ENCOUNTER — Other Ambulatory Visit (HOSPITAL_COMMUNITY): Payer: Self-pay | Admitting: Psychiatry

## 2024-02-27 DIAGNOSIS — R251 Tremor, unspecified: Secondary | ICD-10-CM

## 2024-02-27 DIAGNOSIS — F200 Paranoid schizophrenia: Secondary | ICD-10-CM

## 2024-02-27 DIAGNOSIS — F4310 Post-traumatic stress disorder, unspecified: Secondary | ICD-10-CM

## 2024-02-28 ENCOUNTER — Ambulatory Visit (HOSPITAL_COMMUNITY): Admitting: Licensed Clinical Social Worker

## 2024-03-01 ENCOUNTER — Encounter: Payer: Self-pay | Admitting: Internal Medicine

## 2024-03-01 ENCOUNTER — Ambulatory Visit (HOSPITAL_COMMUNITY): Admitting: Licensed Clinical Social Worker

## 2024-03-01 ENCOUNTER — Ambulatory Visit (INDEPENDENT_AMBULATORY_CARE_PROVIDER_SITE_OTHER): Admitting: Internal Medicine

## 2024-03-01 VITALS — BP 124/80 | HR 110 | Ht 77.0 in | Wt 319.0 lb

## 2024-03-01 DIAGNOSIS — F200 Paranoid schizophrenia: Secondary | ICD-10-CM | POA: Diagnosis not present

## 2024-03-01 DIAGNOSIS — E291 Testicular hypofunction: Secondary | ICD-10-CM | POA: Diagnosis not present

## 2024-03-01 DIAGNOSIS — E221 Hyperprolactinemia: Secondary | ICD-10-CM | POA: Diagnosis not present

## 2024-03-01 MED ORDER — CLOMIPHENE CITRATE 50 MG PO TABS: 50.0000 mg | ORAL_TABLET | Freq: Every day | ORAL | 3 refills | Status: DC

## 2024-03-01 NOTE — Progress Notes (Signed)
 Name: Matthew Padilla  MRN/ DOB: [id number], [birth year]    Age/ Sex: 28 y.o., male    PCP: Rodney Clamp, MD   Reason for Endocrinology Evaluation: Hypogonadism      Date of Initial Endocrinology Evaluation: 01/14/2021    HPI: Matthew Padilla is a 28 y.o. male with a past medical history of Depression . The patient presented for initial endocrinology clinic visit on 01/14/2021 for consultative assistance with his hypogonadism .    He presented to his PCP in 10/2020  For evaluation of severe depression, lack of motivation and mood changes. Mother asked for evaluation for klinefelter syndrome  Which prompted lab work showing normal TSH 1.98 uIU/mL , low total testosterone  at 204 ng/dL ( reference  [id number])  but normal free testosterone  14.4 pg/dL ( reference 9.1-47.8) normal LH and FSH at 6.7 and 1.5 respectively.    Of note the pt was first noted with depression when he entered [place]  [employer] at age 57 which resulted in expelling him due to the fact that he stopped going to classes. He was not treated for depression until a couple years later when he was admitted for in-patient facility, pt was non compliant with anti depressants after discharge.     Moved from [place]  ~2021 No learning difficulties  He went through puberty at age 2     Has two sisters    Was lost to follow-up from 2022 until his return to our office December 2023   24-hour urinary cortisol was normal at 43.1 mcg, total testosterone  low at 115 NG/DL, prolactin slightly elevated at 22.3 NG/mL, with inappropriately normal LH 2.57 mIU/mL and normal TFTs   MRI of the brain showed normal pituitary gland 10/2022  Patient started on clomiphene  10/2022    SUBJECTIVE:    Today (03/01/24):  Matthew Padilla is here for further evaluation of hypogonadism. He is accompanied by his mother today  Patient continues to follow-up with psychiatry for paranoid schizophrenia He also follows with a  counselor  Pt continues with weight loss  Stable  facial Hair growth   Has noted occasional constipation   Denies headaches or changes in vision ( Hx of lasik)  No nipple changes  Per mother, patient continues with lack of motivation  Clomiphene  50 mg daily  HISTORY:  Past Medical History:  Past Medical History:  Diagnosis Date   Anxiety    Low testosterone  in male    Paranoid schizophrenia (HCC)    Past Surgical History:   Social History:  reports that he has never smoked. He has never used smokeless tobacco. He reports that he does not currently use alcohol. He reports current drug use. Drug: Other-see comments. Family History: family history is not on file.   HOME MEDICATIONS: Allergies as of 03/01/2024   No Known Allergies      Medication List        Accurate as of Mar 01, 2024  9:28 AM. If you have any questions, ask your nurse or doctor.          albuterol  108 (90 Base) MCG/ACT inhaler Commonly known as: VENTOLIN  HFA Inhale 2 puffs into the lungs every 6 (six) hours as needed for wheezing or shortness of breath.   clomiPHENE  50 MG tablet Commonly known as: CLOMID  Take 1 tablet (50 mg total) by mouth daily.   hydrOXYzine  25 MG tablet Commonly known as: ATARAX  Take 1 tablet (25 mg total) by mouth at bedtime and may repeat dose one time  if needed.   loratadine  10 MG tablet Commonly known as: CLARITIN  Take 1 tablet (10 mg total) by mouth daily.   metFORMIN  500 MG 24 hr tablet Commonly known as: GLUCOPHAGE -XR Take 1 tablet (500 mg total) by mouth daily with breakfast.   risperiDONE  3 MG tablet Commonly known as: RISPERDAL  Take 1 tablet (3 mg total) by mouth 2 (two) times daily.          REVIEW OF SYSTEMS: A comprehensive ROS was conducted with the patient and is negative except as per HPI     OBJECTIVE:  VS: BP 124/80 (BP Location: Left Arm, Patient Position: Sitting, Cuff Size: Normal)   Pulse (!) 110   Ht 6\' 5"  (1.956 m)   Wt (!) 319 lb  (144.7 kg)   SpO2 98%   BMI 37.83 kg/m     Wt Readings from Last 3 Encounters:  03/01/24 (!) 319 lb (144.7 kg)  09/15/23 (!) 330 lb (149.7 kg)  07/12/23 (!) 340 lb 3.2 oz (154.3 kg)     EXAM: General:  NAD  Lungs: Clear with good BS bilat   Heart: Auscultation: RRR.  Extremities:  BL LE: No pretibial edema normal   Mental Status: Judgment, insight: Intact     DATA REVIEWED:   Latest Reference Range & Units 12/27/23 08:18  LH 1.5 - 9.3 mIU/mL 14.2 (H)  Prolactin 2.0 - 18.0 ng/mL 25.5 (H)  Testosterone , Total, LC-MS-MS 250 - 1,100 ng/dL 829  TSH 5.62 - 1.30 mIU/L 2.72  T4,Free(Direct) 0.8 - 1.8 ng/dL 1.5  (H): Data is abnormally high   Latest Reference Range & Units 09/11/22 07:54  LH 1.50 - 9.30 mIU/mL 2.57  Prolactin 2.0 - 18.0 ng/mL 22.3 (H)  Glucose 70 - 99 mg/dL 94  Testosterone -% Free % 3.1  Testosterone , Serum (Total) ng/dL 865 (L)  Free Testosterone , Serum pg/mL 36 (L)  TSH 0.35 - 5.50 uIU/mL 1.98  T4,Free(Direct) 0.60 - 1.60 ng/dL 7.84     MRI brain 6/96/2952   EXAM: MRI HEAD WITHOUT CONTRAST   TECHNIQUE: Multiplanar, multiecho pulse sequences of the brain and surrounding structures were obtained without intravenous contrast.   COMPARISON:  None Available.   FINDINGS: Brain: Normal size and shape of the pituitary gland. Normal appearance of the infundibulum, suprasellar cistern, and cavernous sinus region.   No infarct, hemorrhage, hydrocephalus, or collection. No white matter disease   Vascular: Major flow voids are preserved   Skull and upper cervical spine: Normal marrow signal   Sinuses/Orbits: Negative   IMPRESSION: Normal noncontrast MRI of the brain and pituitary gland.    ASSESSMENT/PLAN/RECOMMENDATIONS:   Low total serum testosterone :  -Patient has been on clomiphene  intermittently since January 2024  - His most recent testosterone  is within normal range -No changes at this time   Medication Continue clomiphene  50 mg  daily   2.  Hyperprolactinemia:  -Patient on multiple antipsychotic medications that would cause an increase in prolactin level -No intervention -No symptoms -MRI was normal 10/2022   Follow-up in 6 months    Signed electronically by: Natale Bail, MD  [facility] Endocrinology  [facility] [address] [place] [place], [place] [address] Phone: [phone] FAX: [phone]   CC: Rodney Clamp, MD [address] [place] [address] Phone: [phone] Fax: [phone]   Return to Endocrinology clinic as below: Future Appointments  Date Time Provider Department Center  03/01/2024  1:00 PM Patsi Boots, LCSW [facility] None  03/02/2024 11:30 AM Arfeen, Henrine Logan  T, MD [facility] None  05/25/2024 10:00 AM Rodney Clamp, MD [facility]

## 2024-03-01 NOTE — Progress Notes (Signed)
 THERAPIST PROGRESS NOTE   Session Date: 03/01/2024  Session Time: 1305 - 1337  Virtual Visit via Video Note  I connected with Matthew Padilla on 03/01/24 at  1:05 PM EDT by a video enabled telemedicine application and verified that I am speaking with the correct person using two identifiers.  Location: Patient: Home Provider: [facility] OP Office   I discussed the limitations of evaluation and management by telemedicine and the availability of in person appointments. The patient expressed understanding and agreed to proceed.  The patient was advised to call back or seek an in-person evaluation if the symptoms worsen or if the condition fails to improve as anticipated.  I provided 32 minutes of non-face-to-face time during this encounter.  Participation Level: Active  Behavioral Response: CasualAlertAnxious  Type of Therapy: Individual Therapy  Treatment Goals addressed:   STG: Report a decrease in anxiety symptoms as evidenced by an overall reduction in anxiety score by a minimum of 25% on the Generalized Anxiety Disorder Scale (GAD-7)     LTG: "Be able to sleep through the night and not be up worrying so much about my future"       ProgressTowards Goals: Progressing  Interventions: CBT, Motivational Interviewing, and Supportive  Summary: Matthew Padilla is a 28 y.o. male with past psych history of Schizophrenia and Anxiety, presenting for follow-up therapy session in efforts to improve management of anxiousness, paranoia, and depressive symptoms.   Patient engaged in session, presenting in anxious moods and congruent affect, engaging in introductory check-in, reporting of "Feel like I've been doing okay" in general sense. Reassessed depressive and anxious sxs via PHQ-9 and GAD-7, further exploring variances in depressive sxs and reduction in score, and maintained anxious sxs. Processed prior visit exploration of 'Challenging Anxious Thoughts' handout, having not utilized over recent weeks to  support in individual management of anxiousness, reflecting on potential use to be of benefit. Processed continued anxiousness, no variances in home or environmental factors, expressing overall general sense of dread leading to increased anxiousness. Reflected on attempts to maintain daily routine/schedule, noting of having not adhered to daily routine as strongly as previously, noting of having not been sleeping as consistently as previously. Reports sleep being inconsistent, getting approx. 8 hours, but generally broken, feeling un-rested in mornings finding it harder to get going. Processed nightly routine, prepping for bed, and explored potential adjustments pt can make to improve sleep hygiene and nightly routine in efforts to improve sleep.  Patient responded well to interventions. Patient continues to meet criteria for Schizophrenia, and Anxiety. Patient will continue to benefit from engagement in outpatient therapy due to being the least restrictive service to meet presenting needs.      03/01/2024    1:09 PM 01/31/2024    2:13 PM 01/18/2024    2:10 PM 01/05/2024    1:13 PM 12/23/2023   10:23 AM  Depression screen PHQ 2/9  Decreased Interest 2 2 3 3 3   Down, Depressed, Hopeless 2 2 3 2 2   PHQ - 2 Score 4 4 6 5 5   Altered sleeping 2 3 2 3 2   Tired, decreased energy 1 3 3 1 2   Change in appetite 1 1 1  0 1  Feeling bad or failure about yourself  3 3 3 3 2   Trouble concentrating 2 2 2 2 1   Moving slowly or fidgety/restless 0 1 0 1 0  Suicidal thoughts 0 0 0 0 0  PHQ-9 Score 13 17 17 15 13   Difficult doing work/chores Very difficult  Very difficult Very difficult Very difficult Very difficult      03/01/2024    1:07 PM 01/31/2024    2:10 PM 01/18/2024    2:07 PM 01/05/2024    1:12 PM  GAD 7 : Generalized Anxiety Score  Nervous, Anxious, on Edge 2 3 1 3   Control/stop worrying 2 3 3 2   Worry too much - different things 3 2 3 1   Trouble relaxing 2 2 1 2   Restless 1 1 0 0  Easily annoyed or  irritable 2 1 0 0  Afraid - awful might happen 3 3 3 3   Total GAD 7 Score 15 15 11 11   Anxiety Difficulty Very difficult Very difficult Very difficult Very difficult     Suicidal/Homicidal: Nowithout intent/plan  Therapist Response: Clinician utilized CBT, MI, and supportive reflection interventions to address reported sxs and identified challenges.   Clinician actively greeted patient upon joining scheduled virtual visit, engaging in introductory check-in, assessing presenting moods and affect, further engaging patient in reflection of recent events and factors contributing to presenting moods.  Actively listening to reflections of events, further utilizing open-ended questions to the greater prompt patient's engagement in reflection of events and/or activities engaged in, and presentation of symptoms and challenges.  Engage patient in reassessing depressive and anxious symptoms via PHQ-9 and GAD-7, noting of maintained reduction in depressive symptoms, with no variance in anxious symptoms.  Utilized Socratic questioning to encourage greater critical processing of patient's individual efforts at managing presenting anxious symptoms and barriers to doing so.  Utilized psychoeducational and MI interventions to explore patient's sleep challenges, understanding of healthy sleep hygiene, and processed various means in which patient can utilize to improve sleep.  Clinician reassessed severity of presenting sxs, and presence of any safety concerns. Clinician provided support and empathy to patient during session.  Plan: Return again in 2 weeks.  Diagnosis:  Encounter Diagnosis  Name Primary?   Schizophrenia, paranoid (HCC) Yes     Collaboration of Care: Psychiatrist AEB provider documentation available in EHR.  Patient/Guardian was advised Release of Information must be obtained prior to any record release in order to collaborate their care with an outside provider. Patient/Guardian was advised  if they have not already done so to contact the registration department to sign all necessary forms in order for us  to release information regarding their care.   Consent: Patient/Guardian gives verbal consent for treatment and assignment of benefits for services provided during this visit. Patient/Guardian expressed understanding and agreed to proceed.   Patsi Boots, MSW, LCSW 03/01/2024,  1:11 PM

## 2024-03-02 ENCOUNTER — Encounter (HOSPITAL_COMMUNITY): Payer: Self-pay | Admitting: Psychiatry

## 2024-03-02 ENCOUNTER — Ambulatory Visit (HOSPITAL_BASED_OUTPATIENT_CLINIC_OR_DEPARTMENT_OTHER): Admitting: Psychiatry

## 2024-03-02 VITALS — BP 139/89 | HR 99 | Ht 77.0 in | Wt 320.0 lb

## 2024-03-02 DIAGNOSIS — F4310 Post-traumatic stress disorder, unspecified: Secondary | ICD-10-CM | POA: Diagnosis not present

## 2024-03-02 DIAGNOSIS — F4010 Social phobia, unspecified: Secondary | ICD-10-CM | POA: Diagnosis not present

## 2024-03-02 DIAGNOSIS — F200 Paranoid schizophrenia: Secondary | ICD-10-CM | POA: Diagnosis not present

## 2024-03-02 DIAGNOSIS — R251 Tremor, unspecified: Secondary | ICD-10-CM | POA: Diagnosis not present

## 2024-03-02 MED ORDER — PAROXETINE HCL 10 MG PO TABS: 10.0000 mg | ORAL_TABLET | Freq: Every day | ORAL | 1 refills | Status: DC

## 2024-03-02 MED ORDER — RISPERIDONE 3 MG PO TABS
3.0000 mg | ORAL_TABLET | Freq: Two times a day (BID) | ORAL | 1 refills | Status: DC
Start: 2024-03-02 — End: 2024-05-16

## 2024-03-02 MED ORDER — HYDROXYZINE HCL 25 MG PO TABS
25.0000 mg | ORAL_TABLET | Freq: Every evening | ORAL | 1 refills | Status: DC | PRN
Start: 2024-03-02 — End: 2024-05-16
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
## 2024-03-16 ENCOUNTER — Ambulatory Visit (HOSPITAL_COMMUNITY): Admitting: Licensed Clinical Social Worker

## 2024-03-16 DIAGNOSIS — R251 Tremor, unspecified: Secondary | ICD-10-CM

## 2024-03-16 DIAGNOSIS — F200 Paranoid schizophrenia: Secondary | ICD-10-CM | POA: Diagnosis not present

## 2024-03-16 NOTE — Progress Notes (Signed)
 THERAPIST PROGRESS NOTE   Session Date: 03/16/2024  Session Time: 1320 - 1358  Virtual Visit via Video Note  I connected with Levonne Rear on 03/16/24 at  1:20 PM EDT by a video enabled telemedicine application and verified that I am speaking with the correct person using two identifiers.  Location: Patient: Home Provider: [facility]   I discussed the limitations of evaluation and management by telemedicine and the availability of in person appointments. The patient expressed understanding and agreed to proceed.  The patient was advised to call back or seek an in-person evaluation if the symptoms worsen or if the condition fails to improve as anticipated.  I provided 38 minutes of non-face-to-face time during this encounter.  Participation Level: Active  Behavioral Response: CasualAlertAnxious  Type of Therapy: Individual Therapy  Treatment Goals addressed:  STG: Report a decrease in anxiety symptoms as evidenced by an overall reduction in anxiety score by a minimum of 25% on the Generalized Anxiety Disorder Scale (GAD-7)     LTG: Be able to sleep through the night and not be up worrying so much about my future       ProgressTowards Goals: Not Progressing  Interventions: CBT, Motivational Interviewing, and Supportive  Summary: Wandalee Gust is a 28 y.o. male with past psych history of Schizophrenia and Anxiety, presenting for follow-up therapy session in efforts to improve management of anxiousness, paranoia, and depressive symptoms.   Patient engaged in session, presenting in anxious moods and congruent affect, engaging in introductory check-in, reporting of things having been okay I guess, further sharing of it having been quiet for the most part, not doing much. Briefly reviewed Voc Rehab resources and pt's interest in exploring re-integration into social settings and exploring workforce opportunities. Engaged in reassessing presenting anxious and depressive sxs via GAD-7  and PHQ-9, further engaging in exploration of variances in screening scores, decreased anxious sxs and attributing recent quietness attributing to improvements in managing anxious sxs and negatively impacting depressive sxs. Further processed increased challenges experienced with falling and staying asleep, feeling like haven't slept at all when waking, making it harder to get going, and keep up with routine. Explored challenges at engaging in socialization with family, I.e. daily walks, exploring desires to establish daily routine and incorporate walks and other activities.  Patient responded well to interventions. Patient continues to meet criteria for Schizophrenia, and Anxiety. Patient will continue to benefit from engagement in outpatient therapy due to being the least restrictive service to meet presenting needs.      03/16/2024    1:31 PM 03/01/2024    1:09 PM 01/31/2024    2:13 PM 01/18/2024    2:10 PM 01/05/2024    1:13 PM  Depression screen PHQ 2/9  Decreased Interest 2 2 2 3 3   Down, Depressed, Hopeless 3 2 2 3 2   PHQ - 2 Score 5 4 4 6 5   Altered sleeping 2 2 3 2 3   Tired, decreased energy 2 1 3 3 1   Change in appetite 0 1 1 1  0  Feeling bad or failure about yourself  3 3 3 3 3   Trouble concentrating 2 2 2 2 2   Moving slowly or fidgety/restless 1 0 1 0 1  Suicidal thoughts 0 0 0 0 0  PHQ-9 Score 15 13 17 17 15   Difficult doing work/chores Very difficult Very difficult Very difficult Very difficult Very difficult      03/16/2024    1:29 PM 03/01/2024    1:07 PM 01/31/2024  2:10 PM 01/18/2024    2:07 PM  GAD 7 : Generalized Anxiety Score  Nervous, Anxious, on Edge 2 2 3 1   Control/stop worrying 2 2 3 3   Worry too much - different things 2 3 2 3   Trouble relaxing 3 2 2 1   Restless 1 1 1  0  Easily annoyed or irritable 0 2 1 0  Afraid - awful might happen 3 3 3 3   Total GAD 7 Score 13 15 15 11   Anxiety Difficulty Very difficult Very difficult Very difficult Very difficult      Suicidal/Homicidal: Nowithout intent/plan  Therapist Response: Clinician utilized CBT, MI, and supportive reflection interventions to address reported sxs and identified challenges.   Clinician actively greeted patient upon joining virtual visit, assessing presenting moods and affect, engaging in introductory check-in, and prompting patient's brief reflection of events and factors contributing to presenting moods.  Engaged patient to the use of open-ended questions in exploration of events of the past 2 weeks, eliciting patient's thoughts and perspectives in relation to events, challenges, and management of stressors.  Reassess presenting depressive and anxious symptoms via PHQ-9 and GAD-7, further engaging patient in greater exploration of variances and scores, noting of mild reduction in anxious symptoms, and mild increase in depressive sxs, and processing patient's thoughts and perspectives related to observed increase in such symptoms.  Briefly explored vocational rehab supports available to patient, encouraging that patient consider further exploration and direct contact with local organization for further support.  Revisited patient's efforts at maintaining healthy sleep hygiene to support and increase in sleep, eliciting patient's thoughts and perspectives surrounding sleep difficulties, and processing inconsistencies noted in regard to improved sleep with utilization of hydroxyzine .  Briefly reflected on patient's individual abilities at exploring and/or considering activities and/or tasks patient would be willing to incorporate into daily/weekly routine, patient's various to doing so, and interest and willingness to revisit efforts over coming week.  Clinician reassessed severity of presenting sxs, and presence of any safety concerns. Clinician provided support and empathy to patient during session.  Homework: Encouraged pt to utilize notebook in order to explore interests he wishes to  incorporate into developing daily routine, as well as adding increased frequency of walking with family daily to increase physical activity.  Plan: Return again in 2 weeks.  Diagnosis:  Encounter Diagnoses  Name Primary?   Schizophrenia, paranoid (HCC) Yes   Tremor      Collaboration of Care: Psychiatrist AEB provider documentation available in EHR.  Patient/Guardian was advised Release of Information must be obtained prior to any record release in order to collaborate their care with an outside provider. Patient/Guardian was advised if they have not already done so to contact the registration department to sign all necessary forms in order for us  to release information regarding their care.   Consent: Patient/Guardian gives verbal consent for treatment and assignment of benefits for services provided during this visit. Patient/Guardian expressed understanding and agreed to proceed.   Patsi Boots, MSW, LCSW 03/16/2024,  1:33 PM

## 2024-03-21 ENCOUNTER — Ambulatory Visit: Admitting: Internal Medicine

## 2024-03-28 ENCOUNTER — Other Ambulatory Visit (HOSPITAL_COMMUNITY): Payer: Self-pay | Admitting: Psychiatry

## 2024-03-28 DIAGNOSIS — F4310 Post-traumatic stress disorder, unspecified: Secondary | ICD-10-CM

## 2024-03-28 DIAGNOSIS — R251 Tremor, unspecified: Secondary | ICD-10-CM

## 2024-03-30 ENCOUNTER — Ambulatory Visit (HOSPITAL_COMMUNITY): Admitting: Licensed Clinical Social Worker

## 2024-03-30 DIAGNOSIS — F4010 Social phobia, unspecified: Secondary | ICD-10-CM

## 2024-03-30 DIAGNOSIS — F200 Paranoid schizophrenia: Secondary | ICD-10-CM | POA: Diagnosis not present

## 2024-03-30 DIAGNOSIS — F4310 Post-traumatic stress disorder, unspecified: Secondary | ICD-10-CM

## 2024-03-30 DIAGNOSIS — R251 Tremor, unspecified: Secondary | ICD-10-CM | POA: Diagnosis not present

## 2024-03-30 NOTE — Progress Notes (Signed)
 THERAPIST PROGRESS NOTE   Session Date: 03/30/2024  Session Time: 1305 - 1545 Virtual Visit via Video Note  I connected with Matthew Padilla on 03/30/24 at  1:00 PM EDT by a video enabled telemedicine application and verified that I am speaking with the correct person using two identifiers.  Location: Patient: Home Provider: [facility]   I discussed the limitations of evaluation and management by telemedicine and the availability of in person appointments. The patient expressed understanding and agreed to proceed.  I discussed the assessment and treatment plan with the patient. The patient was provided an opportunity to ask questions and all were answered. The patient agreed with the plan and demonstrated an understanding of the instructions.   The patient was advised to call back or seek an in-person evaluation if the symptoms worsen or if the condition fails to improve as anticipated.  I provided 40 minutes of non-face-to-face time during this encounter.  Participation Level: Minimal  MSE/Presentation: Behavior: Evasive Speech:  Blocked Thought Process: Coherent and Relevant Cognition: Disorganized Mood: Anxious and Dysphoric Affect: Congruent and Depressed Insight: Limited Appearance: Casual  Type of Therapy: Individual Therapy  Treatment Goals addressed:  STG: Report a decrease in anxiety symptoms as evidenced by an overall reduction in anxiety score by a minimum of 25% on the Generalized Anxiety Disorder Scale (GAD-7)     LTG: Be able to sleep through the night and not be up worrying so much about my future       ProgressTowards Goals: Not Progressing  Interventions: CBT, Motivational Interviewing, and Supportive  Summary: Matthew Padilla is a 28 y.o. male with past psych history of Schizophrenia and Anxiety, presenting for follow-up therapy session in efforts to improve management of anxiousness, paranoia, and depressive symptoms.   Patient engaged in session,  presenting in anxious moods and congruent affect, engaging in introductory check-in, reporting of things having Been about the same. Reassessed presenting anxious and depressive sxs via GAD-7 and PHQ-9, noting of increase in anxious and depressive sxs, further processing pt's observances in presenting sxs, reporting of increased feeling of anxiousness over the last two weeks, noting of there being no significant changes to household routine, noting of having been spending more time thinking about securing employment causing stress. Reflected further on reported overall increase in depressive sxs, feeling more down in regards to not knowing what to do with self, secondary to anxiousness surrounding beginning to explore work opportunities. Further reflected on daily routines and difficulties engaging in daily activities, sharing of experiencing paranoia surrounding going out, confirming continued avoidance of managing sxs. Denies exploring Voc Rehab resources provided over the past month, expressing being scared to make a change. Shares being comfortable with current approach to challenges and uncertainty of wanting things to be different and/or change. Briefly explored possibly interest/benefit of CST or ACT services, depending on eligibility.  Patient responded well to interventions. Patient continues to meet criteria for Schizophrenia, and Anxiety. Patient will continue to benefit from engagement in outpatient therapy due to being the least restrictive service to meet presenting needs.      03/30/2024    1:12 PM 03/16/2024    1:31 PM 03/01/2024    1:09 PM 01/31/2024    2:13 PM 01/18/2024    2:10 PM  Depression screen PHQ 2/9  Decreased Interest 3 2 2 2 3   Down, Depressed, Hopeless 3 3 2 2 3   PHQ - 2 Score 6 5 4 4 6   Altered sleeping 2 2 2 3  2  Tired, decreased energy 2 2 1 3 3   Change in appetite 0 0 1 1 1   Feeling bad or failure about yourself  3 3 3 3 3   Trouble concentrating 3 2 2 2 2   Moving  slowly or fidgety/restless 1 1 0 1 0  Suicidal thoughts 0 0 0 0 0  PHQ-9 Score 17 15 13 17 17   Difficult doing work/chores Very difficult Very difficult Very difficult Very difficult Very difficult      03/30/2024    1:09 PM 03/16/2024    1:29 PM 03/01/2024    1:07 PM 01/31/2024    2:10 PM  GAD 7 : Generalized Anxiety Score  Nervous, Anxious, on Edge 3 2 2 3   Control/stop worrying 3 2 2 3   Worry too much - different things 3 2 3 2   Trouble relaxing 2 3 2 2   Restless 2 1 1 1   Easily annoyed or irritable 1 0 2 1  Afraid - awful might happen 3 3 3 3   Total GAD 7 Score 17 13 15 15   Anxiety Difficulty Very difficult Very difficult Very difficult Very difficult    Suicidal/Homicidal: None, No plan to harm self or others  Therapist Response: Clinician utilized CBT, MI, and supportive reflection interventions to address reported sxs and identified challenges.   Clinician actively greeted patient upon joining virtual visit, assessing presenting moods and affect, engaging in introductory check-in, prompting brief details of morning and factors contributing to presenting moods.  Actively engage patient in reassessing presenting depressive and anxious symptoms via PHQ-9 and GAD-7, further exploring increase in screening scores, evoking patient's perspectives and observations in relation to increased symptoms, and supporting patient in processing thoughts, feelings, and perspectives regarding individual efforts at managing increased symptoms and presenting challenges.  Utilized open-ended questions to further support patient in navigating challenges, individual efforts at managing such, patient's ambivalence surrounding change, and patient's comfort found and avoidance.  Revisited patient's efforts at establishing structure/routine to his day, exploring interest in attempting to revisit efforts.  Clinician contacted [facility] Leav[facility], [facility] and [place], in efforts to explore availability and eligibility of CST  and/or ACT services to better support pt, currently awaiting follow up contact from community agencies in order to relay details to pt and family.  Clinician reassessed severity of presenting sxs, and presence of any safety concerns. Clinician provided support and empathy to patient during session.  Homework: Encouraged pt to increase efforts/attempts at re-establishing daily routine to include completion of ADLs to support in implementation of structure.  Plan: Return again in 2 weeks.  Diagnosis:  Encounter Diagnoses  Name Primary?   Schizophrenia, paranoid (HCC) Yes   Tremor    PTSD (post-traumatic stress disorder)    Social anxiety disorder     Collaboration of Care: Psychiatrist AEB provider documentation available in EHR. and [employer]) AEB Contacted alternate community providers to explore availability of CST and/or ACT services pt would potentially benefit from.  Patient/Guardian was advised Release of Information must be obtained prior to any record release in order to collaborate their care with an outside provider. Patient/Guardian was advised if they have not already done so to contact the registration department to sign all necessary forms in order for us  to release information regarding their care.   Consent: Patient/Guardian gives verbal consent for treatment and assignment of benefits for services provided during this visit. Patient/Guardian expressed understanding and agreed to proceed.   Matthew Padilla, MSW, LCSW 03/30/2024,  1:14 PM

## 2024-04-13 ENCOUNTER — Ambulatory Visit (INDEPENDENT_AMBULATORY_CARE_PROVIDER_SITE_OTHER): Admitting: Licensed Clinical Social Worker

## 2024-04-13 ENCOUNTER — Encounter (HOSPITAL_COMMUNITY): Payer: Self-pay

## 2024-04-13 DIAGNOSIS — Z91199 Patient's noncompliance with other medical treatment and regimen due to unspecified reason: Secondary | ICD-10-CM

## 2024-04-13 NOTE — Progress Notes (Signed)
 THERAPIST PROGRESS NOTE   Session Date: 04/13/2024  Session Time: 1300  Patient no-showed today's appointment; appointment was for follow up therapy services, provider notified for review of record, patient agrees to reschedule missed appointment.   Lynwood JONETTA Maris, MSW, LCSW 04/13/2024,  8:23 AM

## 2024-04-25 ENCOUNTER — Other Ambulatory Visit (HOSPITAL_COMMUNITY): Payer: Self-pay | Admitting: Psychiatry

## 2024-04-25 DIAGNOSIS — R251 Tremor, unspecified: Secondary | ICD-10-CM

## 2024-04-25 DIAGNOSIS — F4010 Social phobia, unspecified: Secondary | ICD-10-CM

## 2024-04-25 DIAGNOSIS — F200 Paranoid schizophrenia: Secondary | ICD-10-CM

## 2024-04-25 DIAGNOSIS — F4310 Post-traumatic stress disorder, unspecified: Secondary | ICD-10-CM

## 2024-04-26 ENCOUNTER — Ambulatory Visit (HOSPITAL_COMMUNITY): Admitting: Licensed Clinical Social Worker

## 2024-04-26 DIAGNOSIS — F4310 Post-traumatic stress disorder, unspecified: Secondary | ICD-10-CM | POA: Diagnosis not present

## 2024-04-26 DIAGNOSIS — R251 Tremor, unspecified: Secondary | ICD-10-CM | POA: Diagnosis not present

## 2024-04-26 DIAGNOSIS — F200 Paranoid schizophrenia: Secondary | ICD-10-CM | POA: Diagnosis not present

## 2024-04-26 NOTE — Progress Notes (Unsigned)
 THERAPIST PROGRESS NOTE   Session Date: 04/26/2024  Session Time: 1305 - 1351 Virtual Visit via Video Note  I connected with Matthew Padilla on 04/26/24 at  1:00 PM EDT by a video enabled telemedicine application and verified that I am speaking with the correct person using two identifiers.  Location: Patient: Home Provider: [facility]   I discussed the limitations of evaluation and management by telemedicine and the availability of in person appointments. The patient expressed understanding and agreed to proceed.  I discussed the assessment and treatment plan with the patient. The patient was provided an opportunity to ask questions and all were answered. The patient agreed with the plan and demonstrated an understanding of the instructions.   The patient was advised to call back or seek an in-person evaluation if the symptoms worsen or if the condition fails to improve as anticipated.  I provided 45 minutes of non-face-to-face time during this encounter.  Participation Level: Minimal  MSE/Presentation: Behavior: Evasive Speech:  Blocked Thought Process: Coherent and Relevant Cognition: Disorganized Mood: Anxious and Dysphoric Affect: Congruent and Depressed Insight: Limited Appearance: Casual  Type of Therapy: Individual Therapy  Treatment Goals addressed:  STG: Report a decrease in anxiety symptoms as evidenced by an overall reduction in anxiety score by a minimum of 25% on the Generalized Anxiety Disorder Scale (GAD-7)     LTG: Be able to sleep through the night and not be up worrying so much about my future       ProgressTowards Goals: Not Progressing  Interventions: CBT, Motivational Interviewing, and Supportive  Summary: Matthew Padilla is a 28 y.o. male with past psych history of Schizophrenia and Anxiety, presenting for follow-up therapy session in efforts to improve management of anxiousness, paranoia, and depressive symptoms.   Patient engaged in session,  presenting in anxious moods and congruent affect, engaging in introductory check-in, reporting of things having Been okay, just okay I guess. Pt further detailed recent trip to [place] two weeks ago to see sister, finding it to be nice to see sister as well as going to the zoo in [place]. Explored relationship with sister residing in [place], feeling they have a strong relationship and connect regularly. Actively engaged in reassessing presenting depressive and anxious sxs via PHQ-9 and GAD-7, further processing reduction in screening scores, sharing individual observed improvements, identifying of noticing improvements in sleep about 3 weeks ago and in turn feeling this has proving to improve moods and overall sxs. Shared of still not proving to leave the home, not feeling this would be beneficial to support in management of sxs. Reported of things going well at home with not experiencing any significant conflict with parents. Revisited pt's avoidance of connecting with Vocational Rehab services, sharing of barriers to contacting organization due to being nervous about overall process and what services would be like, processing brief details of services provided, exploring thoughts of how program may be supportive in providing training and securing a job.       Reassessed presenting anxious and depressive sxs via GAD-7 and PHQ-9, noting of increase in anxious and depressive sxs, further processing pt's observances in presenting sxs, reporting of increased feeling of anxiousness over the last two weeks, noting of there being no significant changes to household routine, noting of having been spending more time thinking about securing employment causing stress. Reflected further on reported overall increase in depressive sxs, feeling more down in regards to not knowing what to do with self, secondary to anxiousness surrounding beginning to explore  work opportunities. Further reflected on daily routines and difficulties  engaging in daily activities, sharing of experiencing paranoia surrounding going out, confirming continued avoidance of managing sxs. Denies exploring Voc Rehab resources provided over the past month, expressing being scared to make a change. Shares being comfortable with current approach to challenges and uncertainty of wanting things to be different and/or change. Briefly explored possibly interest/benefit of CST or ACT services, depending on eligibility.  Patient responded well to interventions. Patient continues to meet criteria for Schizophrenia, and Anxiety. Patient will continue to benefit from engagement in outpatient therapy due to being the least restrictive service to meet presenting needs.      04/26/2024    1:13 PM 03/30/2024    1:12 PM 03/16/2024    1:31 PM 03/01/2024    1:09 PM 01/31/2024    2:13 PM  Depression screen PHQ 2/9  Decreased Interest 3 3 2 2 2   Down, Depressed, Hopeless 2 3 3 2 2   PHQ - 2 Score 5 6 5 4 4   Altered sleeping 2 2 2 2 3   Tired, decreased energy 2 2 2 1 3   Change in appetite 0 0 0 1 1  Feeling bad or failure about yourself  3 3 3 3 3   Trouble concentrating 2 3 2 2 2   Moving slowly or fidgety/restless 0 1 1 0 1  Suicidal thoughts 0 0 0 0 0  PHQ-9 Score 14 17 15 13 17   Difficult doing work/chores Very difficult Very difficult Very difficult Very difficult Very difficult      04/26/2024    1:11 PM 03/30/2024    1:09 PM 03/16/2024    1:29 PM 03/01/2024    1:07 PM  GAD 7 : Generalized Anxiety Score  Nervous, Anxious, on Edge 3 3 2 2   Control/stop worrying 2 3 2 2   Worry too much - different things 2 3 2 3   Trouble relaxing 1 2 3 2   Restless 0 2 1 1   Easily annoyed or irritable 1 1 0 2  Afraid - awful might happen 3 3 3 3   Total GAD 7 Score 12 17 13 15   Anxiety Difficulty Very difficult Very difficult Very difficult Very difficult    Suicidal/Homicidal: None, No plan to harm self or others  Therapist Response: Clinician utilized CBT, MI, and  supportive reflection interventions to address reported sxs and identified challenges.   Clinician actively greeted patient upon joining virtual visit, assessing presenting moods and affect, engaging in introductory check-in.     prompting brief details of morning and factors contributing to presenting moods.  Actively engage patient in reassessing presenting depressive and anxious symptoms via PHQ-9 and GAD-7, further exploring increase in screening scores, evoking patient's perspectives and observations in relation to increased symptoms, and supporting patient in processing thoughts, feelings, and perspectives regarding individual efforts at managing increased symptoms and presenting challenges.  Utilized open-ended questions to further support patient in navigating challenges, individual efforts at managing such, patient's ambivalence surrounding change, and patient's comfort found and avoidance.  Revisited patient's efforts at establishing structure/routine to his day, exploring interest in attempting to revisit efforts.  Clinician contacted [facility] Leavell, [facility] and [place], in efforts to explore availability and eligibility of CST and/or ACT services to better support pt, currently awaiting follow up contact from community agencies in order to relay details to pt and family.  Clinician reassessed severity of presenting sxs, and presence of any safety concerns. Clinician provided support and empathy to patient during session.  Homework: Encouraged pt to increase efforts/attempts at re-establishing daily routine to include completion of ADLs to support in implementation of structure.  Plan: Return again in 4 weeks.  Diagnosis:  Encounter Diagnoses  Name Primary?   Schizophrenia, paranoid (HCC) Yes   Tremor    PTSD (post-traumatic stress disorder)     Collaboration of Care: Psychiatrist AEB provider documentation available in EHR. and [employer]) AEB Contacted alternate community  providers to explore availability of CST and/or ACT services pt would potentially benefit from.  Patient/Guardian was advised Release of Information must be obtained prior to any record release in order to collaborate their care with an outside provider. Patient/Guardian was advised if they have not already done so to contact the registration department to sign all necessary forms in order for us  to release information regarding their care.   Consent: Patient/Guardian gives verbal consent for treatment and assignment of benefits for services provided during this visit. Patient/Guardian expressed understanding and agreed to proceed.   Lynwood JONETTA Maris, MSW, LCSW 04/26/2024,  1:15 PM

## 2024-05-04 ENCOUNTER — Ambulatory Visit (HOSPITAL_COMMUNITY): Admitting: Psychiatry

## 2024-05-16 ENCOUNTER — Other Ambulatory Visit (HOSPITAL_COMMUNITY): Payer: Self-pay | Admitting: *Deleted

## 2024-05-16 DIAGNOSIS — F4010 Social phobia, unspecified: Secondary | ICD-10-CM

## 2024-05-16 DIAGNOSIS — F200 Paranoid schizophrenia: Secondary | ICD-10-CM

## 2024-05-16 DIAGNOSIS — R251 Tremor, unspecified: Secondary | ICD-10-CM

## 2024-05-16 DIAGNOSIS — F4310 Post-traumatic stress disorder, unspecified: Secondary | ICD-10-CM

## 2024-05-16 MED ORDER — RISPERIDONE 3 MG PO TABS
3.0000 mg | ORAL_TABLET | Freq: Two times a day (BID) | ORAL | 0 refills | Status: DC
Start: 2024-05-16 — End: 2024-06-01

## 2024-05-16 MED ORDER — PAROXETINE HCL 10 MG PO TABS: 10.0000 mg | ORAL_TABLET | Freq: Every day | ORAL | 0 refills | Status: DC

## 2024-05-16 MED ORDER — HYDROXYZINE HCL 25 MG PO TABS: 25.0000 mg | ORAL_TABLET | Freq: Every evening | ORAL | 0 refills | Status: DC | PRN

## 2024-05-25 ENCOUNTER — Encounter: Payer: 59 | Admitting: Family Medicine

## 2024-05-29 ENCOUNTER — Other Ambulatory Visit (HOSPITAL_COMMUNITY): Payer: Self-pay | Admitting: Psychiatry

## 2024-05-29 DIAGNOSIS — R251 Tremor, unspecified: Secondary | ICD-10-CM

## 2024-05-29 DIAGNOSIS — F200 Paranoid schizophrenia: Secondary | ICD-10-CM

## 2024-05-29 DIAGNOSIS — F4310 Post-traumatic stress disorder, unspecified: Secondary | ICD-10-CM

## 2024-05-29 DIAGNOSIS — F4010 Social phobia, unspecified: Secondary | ICD-10-CM

## 2024-05-30 ENCOUNTER — Ambulatory Visit (INDEPENDENT_AMBULATORY_CARE_PROVIDER_SITE_OTHER): Admitting: Licensed Clinical Social Worker

## 2024-05-30 DIAGNOSIS — F419 Anxiety disorder, unspecified: Secondary | ICD-10-CM

## 2024-05-30 DIAGNOSIS — F200 Paranoid schizophrenia: Secondary | ICD-10-CM

## 2024-05-30 NOTE — Progress Notes (Unsigned)
 THERAPIST PROGRESS NOTE   Session Date: 05/30/2024  Session Time: 1305 - 1342 Virtual Visit via Video Note  I connected with Evalene Plume on 05/30/24 at  1:00 PM EDT by a video enabled telemedicine application and verified that I am speaking with the correct person using two identifiers.  Location: Patient: Home Provider: Home Office   I discussed the limitations of evaluation and management by telemedicine and the availability of in person appointments. The patient expressed understanding and agreed to proceed.  I discussed the assessment and treatment plan with the patient. The patient was provided an opportunity to ask questions and all were answered. The patient agreed with the plan and demonstrated an understanding of the instructions.   The patient was advised to call back or seek an in-person evaluation if the symptoms worsen or if the condition fails to improve as anticipated.  I provided 37 minutes of non-face-to-face time during this encounter.  Participation Level: Minimal  MSE/Presentation: Behavior: Appropriate Speech: Normal Thought Process: Coherent and Relevant Cognition: Appropriate Mood: Anxious and Dysphoric Affect: Congruent and Depressed Insight: Limited Appearance: Casual  Type of Therapy: Individual Therapy  Treatment Goals addressed:   Progressing (1) LTG: Be able to sleep through the night and not be up worrying so much about my future (Anxiety)  Not Progressing (1) STG: Report a decrease in anxiety symptoms as evidenced by an overall reduction in anxiety score by a minimum of 25% on the Generalized Anxiety Disorder Scale (GAD-7) (Anxiety)  ProgressTowards Goals: Not Progressing  Interventions: CBT, Motivational Interviewing, and Supportive  Summary: Velinda is a 28 y.o. male with past psych history of Schizophrenia and Anxiety, presenting for follow-up therapy session in efforts to improve management of anxiousness, paranoia, and depressive  symptoms.   Patient engaged in session, presenting in anxious moods and congruent affect, engaging in introductory check-in, reporting of things having Been more of the same, further sharing of nothing having really changed. Actively engaged in reassessing presenting depressive and anxious sxs via PHQ-9 and GAD-7, noting an increase in sxs and expressing the past few weeks to be a little more difficult in navigating the day-to-day. Pt provided recounts of events of the last month, sharing of having taken vacation to [place] [facility] last week, having spent time at the beach, feeling trip to have lightened mood a bit. Identified feeling anxious surrounding spending so much time in the car, having dealt with challenges by let the time pass and go through the motions. Explored pt's opportunities to explore ACT or CST services provided via email to pt and mother last month, sharing of having not explored together. Processed pt's challenges creating routine and structure in completion of day-to-day tasks, exploring pt's need for support and benefits of services provided by ACT or CST to aid in doing so, processing pt's fear of unknown, as well as challenges initiating conversation with parents exploring enhanced services. Requested that clinician connect with mother via email to explore enhanced services as well as collaborate with Arfeen, MD to possibly visit during upcoming med man appt.   Patient responded well to interventions. Patient continues to meet criteria for Schizophrenia, and Anxiety. Patient will continue to benefit from engagement in outpatient therapy due to being the least restrictive service to meet presenting needs.      05/30/2024    1:09 PM 04/26/2024    1:13 PM 03/30/2024    1:12 PM 03/16/2024    1:31 PM 03/01/2024    1:09 PM  Depression screen PHQ 2/9  Decreased Interest 3 3 3 2 2   Down, Depressed, Hopeless 3 2 3 3 2   PHQ - 2 Score 6 5 6 5 4   Altered sleeping 2 2 2 2 2   Tired, decreased  energy 3 2 2 2 1   Change in appetite 0 0 0 0 1  Feeling bad or failure about yourself  3 3 3 3 3   Trouble concentrating 2 2 3 2 2   Moving slowly or fidgety/restless 0 0 1 1 0  Suicidal thoughts 0 0 0 0 0  PHQ-9 Score 16 14 17 15 13   Difficult doing work/chores Very difficult Very difficult Very difficult Very difficult Very difficult      05/30/2024    1:07 PM 04/26/2024    1:11 PM 03/30/2024    1:09 PM 03/16/2024    1:29 PM  GAD 7 : Generalized Anxiety Score  Nervous, Anxious, on Edge 3 3 3 2   Control/stop worrying 3 2 3 2   Worry too much - different things 3 2 3 2   Trouble relaxing 3 1 2 3   Restless 0 0 2 1  Easily annoyed or irritable 0 1 1 0  Afraid - awful might happen 3 3 3 3   Total GAD 7 Score 15 12 17 13   Anxiety Difficulty Very difficult Very difficult Very difficult Very difficult    Suicidal/Homicidal: None, No plan to harm self or others  Therapist Response: Clinician utilized CBT, MI, and supportive reflection interventions to address reported sxs and identified challenges.   Clinician actively greeted patient upon joining virtual visit, assessing presenting moods and affect. Actively engaged pt in introductory check-in, utilizing open ended questions in efforts of evoking pt's recounts of events of the past month, presenting moods, and progressions and/or lack thereof in management of presenting stressors. Engaged in reassessing of presenting depressive and anxious sxs via PHQ-9 and GAD-7, noting of minor increase in both sxs areas, eliciting pt's perspectives of increased sxs and greater challenges in navigating day-to-day events. Actively listened to pt's recounts of events and instances of increased anxiousness over the past month, further evoking pt's perspectives of how he proved to manage sxs and stress. Engaged pt in review of previously explored details surrounding ACT and CST, evoking pt's perspectives surrounding barriers to exploring such services at this  time. Connected with Arfeen, MD per pt's request to support in navigating conversation with parents at upcoming med man visit.  Clinician reassessed severity of presenting sxs, and presence of any safety concerns. Clinician provided support and empathy to patient during session.  Homework: Collaborate with parents in exploration of potential benefits in seeking ACT or CST services.  Plan: Return again in 2 weeks.  Diagnosis:  Encounter Diagnosis  Name Primary?   Schizophrenia, paranoid (HCC) Yes   Collaboration of Care: Psychiatrist AEB provider documentation available in EHR. and [employer]) AEB Contacted alternate community providers to explore availability of CST and/or ACT services pt would potentially benefit from.  Patient/Guardian was advised Release of Information must be obtained prior to any record release in order to collaborate their care with an outside provider. Patient/Guardian was advised if they have not already done so to contact the registration department to sign all necessary forms in order for us  to release information regarding their care.   Consent: Patient/Guardian gives verbal consent for treatment and assignment of benefits for services provided during this visit. Patient/Guardian expressed understanding and agreed to proceed.   Lynwood JONETTA Maris, MSW, LCSW 05/30/2024,  1:11 PM

## 2024-06-01 ENCOUNTER — Encounter (HOSPITAL_COMMUNITY): Payer: Self-pay | Admitting: Psychiatry

## 2024-06-01 ENCOUNTER — Ambulatory Visit (HOSPITAL_BASED_OUTPATIENT_CLINIC_OR_DEPARTMENT_OTHER): Admitting: Psychiatry

## 2024-06-01 ENCOUNTER — Other Ambulatory Visit: Payer: Self-pay

## 2024-06-01 VITALS — BP 154/96 | Ht 77.0 in | Wt 319.0 lb

## 2024-06-01 DIAGNOSIS — F200 Paranoid schizophrenia: Secondary | ICD-10-CM | POA: Diagnosis not present

## 2024-06-01 DIAGNOSIS — F4310 Post-traumatic stress disorder, unspecified: Secondary | ICD-10-CM

## 2024-06-01 DIAGNOSIS — R251 Tremor, unspecified: Secondary | ICD-10-CM | POA: Diagnosis not present

## 2024-06-01 DIAGNOSIS — F4010 Social phobia, unspecified: Secondary | ICD-10-CM

## 2024-06-01 MED ORDER — PAROXETINE HCL 20 MG PO TABS
20.0000 mg | ORAL_TABLET | Freq: Every day | ORAL | 1 refills | Status: DC
Start: 2024-06-01 — End: 2024-08-11

## 2024-06-01 MED ORDER — HYDROXYZINE HCL 25 MG PO TABS: 25.0000 mg | ORAL_TABLET | Freq: Every evening | ORAL | 1 refills | Status: DC | PRN

## 2024-06-01 MED ORDER — RISPERIDONE 3 MG PO TABS: 3.0000 mg | ORAL_TABLET | Freq: Two times a day (BID) | ORAL | 1 refills | Status: DC
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
## 2024-06-13 ENCOUNTER — Ambulatory Visit (INDEPENDENT_AMBULATORY_CARE_PROVIDER_SITE_OTHER): Admitting: Licensed Clinical Social Worker

## 2024-06-13 DIAGNOSIS — F419 Anxiety disorder, unspecified: Secondary | ICD-10-CM | POA: Diagnosis not present

## 2024-06-13 DIAGNOSIS — F200 Paranoid schizophrenia: Secondary | ICD-10-CM | POA: Diagnosis not present

## 2024-06-13 DIAGNOSIS — R251 Tremor, unspecified: Secondary | ICD-10-CM | POA: Diagnosis not present

## 2024-06-13 DIAGNOSIS — F4310 Post-traumatic stress disorder, unspecified: Secondary | ICD-10-CM

## 2024-06-13 NOTE — Progress Notes (Signed)
 THERAPIST PROGRESS NOTE   Session Date: 06/13/2024  Session Time: 1300 - 1346   Participation Level: Minimal  MSE/Presentation: Behavior: Resistant, Evasive, and lethargic Speech:  Blocked; Paucity Thought Process: Coherent and Relevant Cognition: Lacking Mood: Anxious and Dysphoric Affect: Congruent and Flat Insight: Limited Appearance: Casual  Type of Therapy: Individual Therapy  Treatment Goals addressed:   Progressing (1) LTG: Be able to sleep through the night and not be up worrying so much about my future (Anxiety)  Not Progressing (1) STG: Report a decrease in anxiety symptoms as evidenced by an overall reduction in anxiety score by a minimum of 25% on the Generalized Anxiety Disorder Scale (GAD-7) (Anxiety)  ProgressTowards Goals: Not Progressing  Interventions: CBT, Motivational Interviewing, and Supportive  Summary: Velinda is a 28 y.o. male with past psych history of Schizophrenia and Anxiety, presenting for follow-up therapy session in efforts to improve management of anxiousness, paranoia, and depressive symptoms.   Patient engaged in session, presenting in anxious moods and congruent affect, engaging in introductory check-in, reporting of doing okay, further detailing of day-to-day maintaining no progression in managing sxs, and having been pretty consistent with routine, sharing of waking up around the same time, completing ADLs, and typically hanging out in room, isolating. No noted improvements in moods. Actively engaged in reviewing of recent med man visit on 8/28, noting of having Paxil  increase from 10-20mg , reporting of not noticing significant difference over past 1.5-2 weeks. Pt actively engaged in reassessing presenting anxious and depressive sxs via GAD-7 and PHQ-9, noting of minimal variances in sxs and maintained daily challenges. Engaged in processing individual efforts at exploring potential interest or benefits of ACT services via [facility] and [facility]  supports, noting of continued avoidance and inability to effectively communicate to family. Actively engaged in reflection of progression and/or lack thereof throughout tx thus far, exploring thoughts in relation to pursuing higher LOC, specifically PHP, in aims of increasing level of support in managing presenting challenges. Pt expressed lack of confidence in abilities to make decision surrounding enhanced services topic of discussion, proven open motherjoining visit to explore potential options for alternate LOC to better support pt in managing presenting MH sxs.      06/13/2024    1:37 PM 05/30/2024    1:09 PM 04/26/2024    1:13 PM 03/30/2024    1:12 PM 03/16/2024    1:31 PM  Depression screen PHQ 2/9  Decreased Interest 1 3 3 3 2   Down, Depressed, Hopeless 2 3 2 3 3   PHQ - 2 Score 3 6 5 6 5   Altered sleeping 2 2 2 2 2   Tired, decreased energy 1 3 2 2 2   Change in appetite 0 0 0 0 0  Feeling bad or failure about yourself  3 3 3 3 3   Trouble concentrating 2 2 2 3 2   Moving slowly or fidgety/restless 1 0 0 1 1  Suicidal thoughts 0 0 0 0 0  PHQ-9 Score 12 16 14 17 15   Difficult doing work/chores Very difficult Very difficult Very difficult Very difficult Very difficult      06/13/2024    1:36 PM 05/30/2024    1:07 PM 04/26/2024    1:11 PM 03/30/2024    1:09 PM  GAD 7 : Generalized Anxiety Score  Nervous, Anxious, on Edge 3 3 3 3   Control/stop worrying 3 3 2 3   Worry too much - different things 2 3 2 3   Trouble relaxing 1 3 1 2   Restless 2  0 0 2  Easily annoyed or irritable 0 0 1 1  Afraid - awful might happen 3 3 3 3   Total GAD 7 Score 14 15 12 17   Anxiety Difficulty Very difficult Very difficult Very difficult Very difficult    Suicidal/Homicidal: None, No plan to harm self or others  Therapist Response: Clinician utilized CBT, MI, and supportive reflection interventions to address reported sxs and identified challenges.   Clinician openly greeted patient upon joining virtual  visit, assessing presenting moods and affect. Actively engaged pt in introductory check-in, utilizing open ended questions in aims of evoking pt's recounts of events of the past two weeks, presenting moods, presenting stressors/challenges, and pt's individual means of navigating such. Utilized active listening techniques in aims of supporting pt's recounts of recent weeks, supporting in processing no observed changes in moods, sxs, and management of presenting stressors. Provided support in sharing details surrounding alternate community resources and enhanced services available to pt to include PHP, IOP and ACT, further supporting pt in relaying details to mother whom pt expressed desires for clinician to share information with, and informing mother and pt of referral process. Clinician reassessed severity of presenting sxs, and presence of any safety concerns. Patient responded well to interventions. Patient continues to meet criteria for Schizophrenia, and Anxiety. Patient will benefit from engagement in enhanced services to better support pt's severe, persistent MH needs. Pt continues to display minimal, consistent progression towards tx goals.  Homework: None.  Plan: Participate in PHP in aims of improving management of presenting sxs, with plan to transition to IOP or resume OPT, depending on medical necessity, upon successful completion.  Diagnosis:  Encounter Diagnoses  Name Primary?   Schizophrenia, paranoid (HCC) Yes   Tremor    PTSD (post-traumatic stress disorder)    Anxiety    Collaboration of Care: Psychiatrist AEB provider documentation available in EHR. and Other referred to [facility] with [facility].  Patient/Guardian was advised Release of Information must be obtained prior to any record release in order to collaborate their care with an outside provider. Patient/Guardian was advised if they have not already done so to contact the registration department to sign all necessary forms in order  for us  to release information regarding their care.   Consent: Patient/Guardian gives verbal consent for treatment and assignment of benefits for services provided during this visit. Patient/Guardian expressed understanding and agreed to proceed.   Virtual Visit via Video Note  I connected with Carroll Curtiss on 06/13/24 at  1:00 PM EDT by a video enabled telemedicine application and verified that I am speaking with the correct person using two identifiers.  Location: Patient: Home Provider: Home Office   I discussed the limitations of evaluation and management by telemedicine and the availability of in person appointments. The patient expressed understanding and agreed to proceed.  I discussed the assessment and treatment plan with the patient. The patient was provided an opportunity to ask questions and all were answered. The patient agreed with the plan and demonstrated an understanding of the instructions.   The patient was advised to call back or seek an in-person evaluation if the symptoms worsen or if the condition fails to improve as anticipated.  I provided 46 minutes of non-face-to-face time during this encounter.   Lynwood JONETTA Maris, MSW, LCSW 06/13/2024,  1:57 PM

## 2024-06-14 ENCOUNTER — Telehealth (HOSPITAL_COMMUNITY): Payer: Self-pay | Admitting: Licensed Clinical Social Worker

## 2024-06-14 NOTE — Telephone Encounter (Signed)
 See call log

## 2024-06-19 ENCOUNTER — Ambulatory Visit (INDEPENDENT_AMBULATORY_CARE_PROVIDER_SITE_OTHER): Admitting: Professional

## 2024-06-19 DIAGNOSIS — F200 Paranoid schizophrenia: Secondary | ICD-10-CM
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
## 2024-06-25 ENCOUNTER — Other Ambulatory Visit (HOSPITAL_COMMUNITY): Payer: Self-pay | Admitting: Psychiatry

## 2024-06-25 DIAGNOSIS — F200 Paranoid schizophrenia: Secondary | ICD-10-CM

## 2024-06-25 DIAGNOSIS — F4010 Social phobia, unspecified: Secondary | ICD-10-CM

## 2024-06-25 DIAGNOSIS — R251 Tremor, unspecified: Secondary | ICD-10-CM

## 2024-06-25 DIAGNOSIS — F4310 Post-traumatic stress disorder, unspecified: Secondary | ICD-10-CM

## 2024-06-27 ENCOUNTER — Ambulatory Visit (INDEPENDENT_AMBULATORY_CARE_PROVIDER_SITE_OTHER): Admitting: Licensed Clinical Social Worker

## 2024-06-27 ENCOUNTER — Ambulatory Visit (HOSPITAL_COMMUNITY)

## 2024-06-27 DIAGNOSIS — F200 Paranoid schizophrenia: Secondary | ICD-10-CM | POA: Diagnosis not present

## 2024-06-27 DIAGNOSIS — F4010 Social phobia, unspecified: Secondary | ICD-10-CM

## 2024-06-27 DIAGNOSIS — R4589 Other symptoms and signs involving emotional state: Secondary | ICD-10-CM

## 2024-06-27 DIAGNOSIS — F419 Anxiety disorder, unspecified: Secondary | ICD-10-CM

## 2024-06-27 DIAGNOSIS — F4310 Post-traumatic stress disorder, unspecified: Secondary | ICD-10-CM

## 2024-06-27 NOTE — Psych (Signed)
 [facility] PHP THERAPIST PROGRESS NOTE  Matthew Padilla [id number]   Session Time: 9:00 am - 9:45 am  Participation Level: Minimal  Behavioral Response: Casual and GuardedAlertAnxious, Depressed, and Flat  Type of Therapy: Group Therapy  Treatment Goals addressed: Coping  Progress Towards Goals: Initial  Interventions: CBT, DBT, Solution Focused, Strength-based, Supportive, and Reframing  Therapist Response: Clinician led check-in regarding current stressors and situation, and review of patient completed daily inventory. Clinician utilized active listening and empathetic response and validated patient emotions. Clinician facilitated processing group on pertinent issues.?   Summary: Patient arrived within time allowed. Patient rates their depression at a 7 and anxiety at a 7 on a scale of 1-10 with 10 being best. Pt invited to share information about himself and/or discuss current stressors with group. Pt shared he has been experiencing anxiety and depression and he has engaged in individual therapy for the past 6 months without relief. He reports struggling with social anxiety and overthinking how he responds to others. When asked about sleep and appetite, pt reports he slept 8 hours last night and ate 3 meals yesterday . Pt denied experiencing SI/SH thoughts and feelings of hopelessness. Pt did not engage unless specifically prompted and demonstrated paucity of speech when responding.      Session Time: 9:45 am - 11:00 am  Participation Level: Minimal  Behavioral Response: Casual and GuardedAlertAnxious, Depressed, and flat  Type of Therapy: Group Therapy  Treatment Goals addressed: Coping  Progress Towards Goals: Initial  Interventions: CBT, DBT, Solution Focused, Strength-based, Supportive, and Reframing  Therapist Response: Clinician led discussion on anxiety. Clinician began with psychoeducation explaining the amygdala and the brain's fear response, as well as explaining the  evolutionary purpose of physical symptoms. Clinician provided information about how to decrease the fear response through acceptance and exposure. Clinician also provided information about interoceptive exercises and invited patients to participate in one based on which physical symptoms they find the most distressing. After participating in the exercise, clinician acknowledged the symptoms as being present and uncomfortable, but not dangerous.   Summary: Pt did not engage unless specifically prompted and demonstrated paucity of speech when responding. He declined to participate in interoceptive exercises.    Session Time: 11:00 am - 12:00 pm  Participation Level: Minimal  Behavioral Response: Casual and GuardedAlertAnxious, Depressed, and flat  Type of Therapy: Group Therapy  Treatment Goals addressed: Coping  Progress Towards Goals: Initial  Interventions: CBT, DBT, Solution Focused, Strength-based, Supportive, and Reframing  Therapist Response: Group was led by [facility] chaplain, Greig Daring.  Summary: Pt did not engage unless specifically prompted and demonstrated paucity of speech when responding.   Session Time: 12:00 pm - 12:30 pm  Participation Level: None  Behavioral Response: Casual and GuardedAlertAnxious, Depressed, and flat  Type of Therapy: Group Therapy  Treatment Goals addressed: Coping  Progress Towards Goals: Initial  Interventions: Psychologist, occupational, Supportive  Therapist Response: Reflection Group: Patients encouraged to practice skills and interpersonal techniques or work on mindfulness and relaxation techniques. The importance of self-care and making skills part of a routine to increase usage were stressed.  Summary: Patient did not engage with peers.   Session Time: 12:30 pm - 1:30 pm  Participation Level: Minimal  Behavioral Response: Casual and GuardedAlertAnxious, Depressed, and flat  Type of Therapy: Group Therapy  Treatment Goals  addressed: Coping  Progress Towards Goals: Initial  Interventions: CBT, DBT, Solution Focused, Strength-based, Supportive, and Reframing  Therapist Response: Group was led by occupational therapist, Dallas  Hollan.   Summary: Pt did not engage unless specifically prompted and demonstrated paucity of speech when responding. Patients were dismissed at 1:30 pm due to poor participation. Patient denies SI/HI/self-harm thoughts at the end of group and agrees to seek help should those thoughts/feelings occur.?    Suicidal/Homicidal: Nowithout intent/plan  Plan: ?Pt will continue in PHP and medication management while continuing to work on decreasing depression symptoms,?SI, and anxiety symptoms,?and increasing the ability to self manage symptoms.   Collaboration of Care: Medication Management AEB Staci Kerns NP  Patient/Guardian was advised Release of Information must be obtained prior to any record release in order to collaborate their care with an outside provider. Patient/Guardian was advised if they have not already done so to contact the registration department to sign all necessary forms in order for us  to release information regarding their care.   Consent: Patient/Guardian gives verbal consent for treatment and assignment of benefits for services provided during this visit. Patient/Guardian expressed understanding and agreed to proceed.   Diagnosis: Schizophrenia, paranoid (HCC) [F20.0]    1. Schizophrenia, paranoid [facility])       [name], LCSW 06/27/2024

## 2024-06-27 NOTE — Progress Notes (Unsigned)
 Psychiatric Initial Adult Assessment   Patient Identification: Matthew Padilla MRN:  [id number] Date of Evaluation:  06/27/2024 Referral Source: DOROTHA Maris LCSW Chief Complaint:  No chief complaint on file.  Visit Diagnosis: No diagnosis found.  History of Present Illness:  Matthew Padilla   Associated Signs/Symptoms: Depression Symptoms:  depressed mood, difficulty concentrating, (Hypo) Manic Symptoms:  Distractibility, Anxiety Symptoms:  Excessive Worry, Psychotic Symptoms:  Hallucinations: None PTSD Symptoms: NA  Past Psychiatric History:   Previous Psychotropic Medications: Yes   Substance Abuse History in the last 12 months:  No.  Consequences of Substance Abuse: NA  Past Medical History:  Past Medical History:  Diagnosis Date   Anxiety    Low testosterone  in male    Paranoid schizophrenia (HCC)    No past surgical history on file.  Family Psychiatric History:   Family History: No family history on file.  Social History:   Social History   Socioeconomic History   Marital status: Single    Spouse name: Not on file   Number of children: Not on file   Years of education: Not on file   Highest education level: Not on file  Occupational History   Not on file  Tobacco Use   Smoking status: Never   Smokeless tobacco: Never  Substance and Sexual Activity   Alcohol use: Not Currently   Drug use: Yes    Types: Other-see comments   Sexual activity: Never  Other Topics Concern   Not on file  Social History Narrative   Not on file   Social Drivers of Health   Financial Resource Strain: Not on file  Food Insecurity: Patient Declined (04/19/2023)   Hunger Vital Sign    Worried About Running Out of Food in the Last Year: Patient declined    Ran Out of Food in the Last Year: Patient declined  Transportation Needs: Patient Declined (04/19/2023)   PRAPARE - Administrator, Civil Service (Medical): Patient declined    Lack of Transportation  (Non-Medical): Patient declined  Physical Activity: Not on file  Stress: Not on file  Social Connections: Not on file    Additional Social History:   Allergies:  No Known Allergies  Metabolic Disorder Labs: Lab Results  Component Value Date   HGBA1C 5.4 04/22/2023   MPG 108 04/22/2023   Lab Results  Component Value Date   PROLACTIN 25.5 (H) 12/27/2023   PROLACTIN 22.3 (H) 09/11/2022   Lab Results  Component Value Date   CHOL 126 04/22/2023   TRIG 113 04/22/2023   HDL 21 (L) 04/22/2023   CHOLHDL 6.0 04/22/2023   VLDL 23 04/22/2023   LDLCALC 82 04/22/2023   Lab Results  Component Value Date   TSH 2.72 12/27/2023    Therapeutic Level Labs: No results found for: LITHIUM No results found for: CBMZ No results found for: VALPROATE  Current Medications: Current Outpatient Medications  Medication Sig Dispense Refill   albuterol  (VENTOLIN  HFA) 108 (90 Base) MCG/ACT inhaler Inhale 2 puffs into the lungs every 6 (six) hours as needed for wheezing or shortness of breath. 1 each 0   clomiPHENE  (CLOMID ) 50 MG tablet Take 1 tablet (50 mg total) by mouth daily. 90 tablet 3   hydrOXYzine  (ATARAX ) 25 MG tablet Take 1 tablet (25 mg total) by mouth at bedtime and may repeat dose one time if needed. 45 tablet 1   loratadine  (CLARITIN ) 10 MG tablet Take 1 tablet (10 mg total) by mouth daily.  metFORMIN  (GLUCOPHAGE -XR) 500 MG 24 hr tablet Take 1 tablet (500 mg total) by mouth daily with breakfast. 90 tablet 3   PARoxetine  (PAXIL ) 20 MG tablet Take 1 tablet (20 mg total) by mouth daily. 30 tablet 1   risperiDONE  (RISPERDAL ) 3 MG tablet Take 1 tablet (3 mg total) by mouth 2 (two) times daily. 60 tablet 1   No current facility-administered medications for this visit.    Musculoskeletal: Strength & Muscle Tone: within normal limits Gait & Station: normal Patient leans: N/A  Psychiatric Specialty Exam: Review of Systems  There were no vitals taken for this visit.There is no  height or weight on file to calculate BMI.  General Appearance: Casual  Eye Contact:  Minimal  Speech:  Clear and Coherent  Volume:  Decreased  Mood:  Anxious  Affect:  Congruent  Thought Process:  Coherent  Orientation:  Full (Time, Place, and Person)  Thought Content:  Logical  Suicidal Thoughts:  No  Homicidal Thoughts:  No  Memory:  Immediate;   Fair  Judgement:  Fair  Insight:  Fair  Psychomotor Activity:  Decreased  Concentration:  Concentration: Fair  Recall:  [employer] of Knowledge:Good  Language: Fair  Akathisia:  No  Handed:  Right  AIMS (if indicated):  not done  Assets:  Communication Skills Desire for Improvement  ADL's:  Intact  Cognition: WNL  Sleep:  Good   Screenings: AIMS    Flowsheet Row [facility]  AIMS Total Score 0   GAD-7    Flowsheet Row Counselor from 06/13/2024 in [place] [facility] from 05/30/2024 in [place] [facility] from 04/26/2024 in [facility] from 03/30/2024 in [facility] Counselor from 03/16/2024 in [facility] at [facility]  Total GAD-7 Score 14 15 12 17 13    PHQ2-9    Flowsheet Row Counselor from 06/19/2024 in [facility] Counselor from 06/13/2024 in [place] [facility] [place] Counselor from 05/30/2024 in [place] [facility] [place] Counselor from 04/26/2024 in [facility] at [place] Counselor from 03/30/2024 in [place] [facility] Total Score 5 3 6 5 6   PHQ-9 Total Score 13 12 16 14 17    Flowsheet Row Counselor from 06/19/2024 in [facility] Counselor from 12/23/2023 in [place]  [facility] Ad[facility] (Discharged[facility]  C-SSRS RISK CATEGORY No Risk No Risk No Risk    Assessment and Plan:   patient enrolled in Partial Hospitalization Program, patient's current medications are to be continued, the following medications are being prescribed **** a comprehensive treatment plan will be developed and side effects of medications have been reviewed with patient  Treatment options and alternatives reviewed with patient and patient understands the above plan. Treatment plan was reviewed and agreed upon by NP T.Ezzard and patient Matthew Padilla need for group services.  Collaboration of Care: Psychiatrist AEB Afreen  Patient/Guardian was advised Release of Information must be obtained prior to any record release in order to collaborate their care with an outside provider. Patient/Guardian was advised if they have not already done so to contact the registration department to sign all necessary forms in order for us  to release information regarding  their care.   Consent: Patient/Guardian gives verbal consent for treatment and assignment of benefits for services provided during this visit. Patient/Guardian expressed understanding and agreed to proceed.   Matthew LOISE Kerns, NP 9/23/202511:06 AM

## 2024-06-28 ENCOUNTER — Ambulatory Visit (INDEPENDENT_AMBULATORY_CARE_PROVIDER_SITE_OTHER): Admitting: Licensed Clinical Social Worker

## 2024-06-28 ENCOUNTER — Ambulatory Visit (HOSPITAL_COMMUNITY)

## 2024-06-28 ENCOUNTER — Encounter (HOSPITAL_COMMUNITY): Payer: Self-pay

## 2024-06-28 DIAGNOSIS — F200 Paranoid schizophrenia: Secondary | ICD-10-CM | POA: Diagnosis not present

## 2024-06-28 DIAGNOSIS — R4589 Other symptoms and signs involving emotional state: Secondary | ICD-10-CM

## 2024-06-28 NOTE — Progress Notes (Signed)
 Spoke with patient in person for weekly check in for PHP. States that his therapist referred him to [facility] for the first time. He has a lot of anxiety and depression. He has lots of stress just leaving his house and being in group. During this assessment he was constantly moving and wringing his hands unable to sit still. Does state that he sometimes hears mumbling voices. Denies visual hallucinations. Denies SI/HI. On scale 1-10 as 10 being worst he rates depression at 7 and anxiety at 8. PHQ9=14. No other issues or complaints. No side effects from medication.

## 2024-06-29 ENCOUNTER — Ambulatory Visit (INDEPENDENT_AMBULATORY_CARE_PROVIDER_SITE_OTHER): Admitting: Licensed Clinical Social Worker

## 2024-06-29 ENCOUNTER — Ambulatory Visit (HOSPITAL_COMMUNITY)

## 2024-06-29 DIAGNOSIS — F200 Paranoid schizophrenia: Secondary | ICD-10-CM

## 2024-06-29 DIAGNOSIS — R4589 Other symptoms and signs involving emotional state: Secondary | ICD-10-CM

## 2024-06-30 ENCOUNTER — Ambulatory Visit (HOSPITAL_COMMUNITY)

## 2024-06-30 ENCOUNTER — Ambulatory Visit (INDEPENDENT_AMBULATORY_CARE_PROVIDER_SITE_OTHER): Admitting: Licensed Clinical Social Worker

## 2024-06-30 DIAGNOSIS — F200 Paranoid schizophrenia: Secondary | ICD-10-CM

## 2024-06-30 DIAGNOSIS — R4589 Other symptoms and signs involving emotional state: Secondary | ICD-10-CM

## 2024-06-30 NOTE — Progress Notes (Signed)
 Opened in error

## 2024-07-03 ENCOUNTER — Ambulatory Visit (INDEPENDENT_AMBULATORY_CARE_PROVIDER_SITE_OTHER): Admitting: Licensed Clinical Social Worker

## 2024-07-03 ENCOUNTER — Ambulatory Visit (HOSPITAL_COMMUNITY): Admitting: Licensed Clinical Social Worker

## 2024-07-03 DIAGNOSIS — F200 Paranoid schizophrenia: Secondary | ICD-10-CM

## 2024-07-03 NOTE — Psych (Addendum)
 [facility] PHP THERAPIST PROGRESS NOTE  Matthew Padilla [id number]   Session Time: 9:00 am - 10:00 am  Participation Level: Minimal  Behavioral Response: Casual and GuardedAlertAnxious, Depressed, and Flat  Type of Therapy: Group Therapy  Treatment Goals addressed: Coping  Progress Towards Goals: Not Progressing  Interventions: CBT, DBT, Solution Focused, Strength-based, Supportive, and Reframing  Therapist Response: Clinician led check-in regarding current stressors and situation, and review of patient completed daily inventory. Clinician utilized active listening and empathetic response and validated patient emotions. Clinician facilitated processing group on pertinent issues.?   Summary: Patient arrived within time allowed. Patient rates their depression at a 7 and anxiety at a 8 on a scale of 1-10 with 10 being best. Pt reports continued high anxiety and depression. He states he has benefited from group due to socialization and structure. When asked if he has been socializing, as pt has not appeared to, he agrees that he has not. When asked about sleep and appetite, pt reports he slept 8 hours last night and ate 3 meals yesterday. Pt denied experiencing SI/SH thoughts and feelings of hopelessness. Pt did not engage unless specifically prompted and demonstrated paucity of speech when responding.      Session Time: 10:00 am - 11:00 am  Participation Level: None  Behavioral Response: Casual and GuardedAlertAnxious, Depressed, and flat  Type of Therapy: Group Therapy  Treatment Goals addressed: Coping  Progress Towards Goals: Not Progressing  Interventions: CBT, DBT, Solution Focused, Strength-based, Supportive, and Reframing  Therapist Response: Clinician led processing group for pt's current struggles. Group members shared stressors and provided support and feedback. Clinician brought in topics of shame and acceptance to inform discussion.  Summary: Pt did not engage in  discussion.    Session Time: 11:00 am - 12:00 pm  Participation Level: Minimal  Behavioral Response: Casual and GuardedAlertAnxious, Depressed, and flat  Type of Therapy: Group Therapy  Treatment Goals addressed: Coping  Progress Towards Goals: Not Progressing  Interventions: CBT, DBT, Solution Focused, Strength-based, Supportive, and Reframing  Therapist Response: Clinician led group on Cognitive Distortions. Clinician listed different types of cognitive distortions and provided examples. Pts were asked to provide personal examples of cognitive distortions they currently experience or have previously experienced. Patients were also asked to complete a worksheet that utilizes socratic questioning to challenge cognitive distortions.  Summary: Pt did not engage but completed the worksheet as requested.   Session Time: 12:00 pm - 12:30 pm  Participation Level: None  Behavioral Response: Casual and GuardedAlertAnxious, Depressed, and flat  Type of Therapy: Group Therapy  Treatment Goals addressed: Coping  Progress Towards Goals: Not Progressing  Interventions: Psychologist, occupational, Supportive  Therapist Response: Reflection Group: Patients encouraged to practice skills and interpersonal techniques or work on mindfulness and relaxation techniques. The importance of self-care and making skills part of a routine to increase usage were stressed.  Summary: Patient did not engage with peers.   Session Time: 12:30 pm - 1:00 pm  Participation Level: Minimal  Behavioral Response: Casual and GuardedAlertAnxious, Depressed, and flat  Type of Therapy: Group Therapy  Treatment Goals addressed: Coping  Progress Towards Goals: Not Progressing  Interventions: CBT, DBT, Solution Focused, Strength-based, Supportive, and Reframing  Therapist Response: Clinician continued group on cognitive distortions. Pts were asked to identify a specific cognitive distortion they experience and  clinician asked questions to identify facts, beliefs, and feelings contributing to the distortions and assisted with composing reframing statements. Clinician utilized CBT principles to inform discussion.   Summary: Pt  did not engage unless specifically prompted and demonstrated paucity of speech when responding. He demonstrated fair insight into the subject matter.   Session Time: 1:00 pm - 1:30 pm  Summary: Pt participated in activity. Patients were dismissed at 1:30 pm due to OT being absent. Patient denies SI/HI/self-harm thoughts at the end of group and agrees to seek help should those thoughts/feelings occur.?   Participation Level: Active  Behavioral Response: Casual and GuardedAlertAnxious, Depressed, and flat  Type of Therapy: Group Therapy  Treatment Goals addressed: Coping  Progress Towards Goals: Not Progressing  Interventions: CBT, DBT, Solution Focused, Strength-based, Supportive, and Reframing  Therapist Response: Clinician utilized a guided meditation video led by yoga instructor Adriene of [employer] that focuses on controlled breathing. Patients were invited to share their responses upon completion.   Summary: Pt participated in activity. Patients were dismissed at 1:30 pm due to OT being absent. Patient denies SI/HI/self-harm thoughts at the end of group and agrees to seek help should those thoughts/feelings occur.?   Suicidal/Homicidal: Nowithout intent/plan  Plan: ?Pt will continue in PHP and medication management while continuing to work on decreasing depression symptoms,?SI, and anxiety symptoms,?and increasing the ability to self manage symptoms.   Collaboration of Care: Medication Management AEB Staci Kerns NP  Patient/Guardian was advised Release of Information must be obtained prior to any record release in order to collaborate their care with an outside provider. Patient/Guardian was advised if they have not already done so to contact the  registration department to sign all necessary forms in order for us  to release information regarding their care.   Consent: Patient/Guardian gives verbal consent for treatment and assignment of benefits for services provided during this visit. Patient/Guardian expressed understanding and agreed to proceed.   Diagnosis: Schizophrenia, paranoid (HCC) [F20.0]    1. Schizophrenia, paranoid [facility])       [name], LCSW 07/03/2024

## 2024-07-04 ENCOUNTER — Ambulatory Visit (INDEPENDENT_AMBULATORY_CARE_PROVIDER_SITE_OTHER): Admitting: Licensed Clinical Social Worker

## 2024-07-04 DIAGNOSIS — F200 Paranoid schizophrenia: Secondary | ICD-10-CM | POA: Diagnosis not present

## 2024-07-04 NOTE — Psych (Signed)
 [facility] PHP THERAPIST PROGRESS NOTE  Matthew Padilla [id number]   Session Time: 9:00 am - 10:00 am  Participation Level: Minimal  Behavioral Response: Casual and GuardedAlertAnxious, Depressed, and Flat  Type of Therapy: Group Therapy  Treatment Goals addressed: Coping  Progress Towards Goals: Initial  Interventions: CBT, DBT, Solution Focused, Strength-based, Supportive, and Reframing  Therapist Response: Clinician led check-in regarding current stressors and situation, and review of patient completed daily inventory. Clinician utilized active listening and empathetic response and validated patient emotions. Clinician facilitated processing group on pertinent issues.?   Summary: Patient arrived within time allowed. Patient rates their depression at a 7 and anxiety at a 7 on a scale of 1-10 with 10 being best. Pt reports sleeping 8 hours and eating 3 times yesterday. Pt states he rested after group, laying down with a neutral brain. Pt reports he chatted with his dad on the way back home and had dinner with his mom and spent the rest of his day alone. Pt responds to questions when asked directly with minimal elaboration. Pt is observed to be engaged in group AEB small facial responses.  Pt denied experiencing SI/SH thoughts and feelings of hopelessness.        Session Time: 10:00 am - 11:00 am  Participation Level: Minimal  Behavioral Response: Casual and GuardedAlertAnxious, Depressed, and flat  Type of Therapy: Group Therapy  Treatment Goals addressed: Coping  Progress Towards Goals: Initial  Interventions: CBT, DBT, Solution Focused, Strength-based, Supportive, and Reframing  Therapist Response: Clinician led discussion on time and the role it plays in recovery and healing. Cln drew on connection to physical health and the time it would take to heal a broken bone or from the flu. Group discusses ways in which the time element of recovery is difficult for them and can lead  to increased anxiety and self-doubt.   Summary: Pt reports understanding of topic.      Session Time: 11:00 am - 12:00 pm  Participation Level: Minimally  Behavioral Response: CasualAlertAnxiousFlat  Type of Therapy: Group Therapy  Treatment Goals addressed: Coping  Progress Towards Goals: Initial  Interventions: CBT, DBT, Solution Focused, Strength-based, Supportive, and Reframing  Therapist Response: Cln introduced topic of DBT distress tolerance skills. Cln provided context for distress tolerance skills and how to practice them. Cln introduced the ACCEPTS distraction skills and group discusses ways to utilize A C and C activities.   Summary: Pt minimally engaged in discussion and is able to state 1 way to apply this skill.       Session Time: 12:00 pm - 12:30 pm  Participation Level: Minimally  Behavioral Response: CasualAlertAnxiousFlat  Type of Therapy: Group Therapy  Treatment Goals addressed: Coping  Progress Towards Goals: Initial  Interventions: Psychologist, occupational, Supportive  Therapist Response: Reflection Group: Patients encouraged to practice skills and interpersonal techniques or work on mindfulness and relaxation techniques. The importance of self-care and making skills part of a routine to increase usage were stressed.  Summary: Patient minimally engaged and participated appropriately.      Session Time: 12:30 pm - 1:30 pm  Participation Level: Minimal  Behavioral Response: CasualAlertAnxiousFlat  Type of Therapy: Group Therapy  Treatment Goals addressed: Coping  Progress Towards Goals: Initial  Interventions: OT group  Therapist Response: Group was led by occupational therapist, Edward Hollan.   Summary: Pt minimally engaged and participated in discussion.      Session Time: 1:30 pm - 2:00 pm  Participation Level: Minimal  Behavioral Response: CasualAlertAnxiousFlat  Type of Therapy: Group Therapy  Treatment  Goals addressed: Coping  Progress Towards Goals: Initial  Interventions: CBT, DBT, Solution Focused, Strength-based, Supportive, and Reframing  Therapist Response: 1:30 pm - 1:50 pm: Cln led group focused on DBT mindfulness skills. Group practiced diaphragmatic breathing, box breathing, 4-7-8 breathing, lion breathing, and alternate nostril breathing and discussed how to practice and apply breathing strategies to aid in mental health recovery.  1:50 - 2:00 pm: Clinician led check-out. Clinician assessed for immediate needs, medication compliance and efficacy, and safety concerns?  Summary: 1:30 pm - 1:50 pm: Pt participated and minimally engaged in discussion.  1:50 - 2:00 pm: At check-out, patient reports no immediate concerns. Patient demonstrates progress as evidenced by continued engagement and responsiveness to treatment. Patient denies SI/HI/self-harm thoughts at the end of group.    Suicidal/Homicidal: Nowithout intent/plan  Plan: ?Pt will continue in PHP and medication management while continuing to work on decreasing depression symptoms,?SI, and anxiety symptoms,?and increasing the ability to self manage symptoms.   Collaboration of Care: Medication Management AEB Staci Kerns NP  Patient/Guardian was advised Release of Information must be obtained prior to any record release in order to collaborate their care with an outside provider. Patient/Guardian was advised if they have not already done so to contact the registration department to sign all necessary forms in order for us  to release information regarding their care.   Consent: Patient/Guardian gives verbal consent for treatment and assignment of benefits for services provided during this visit. Patient/Guardian expressed understanding and agreed to proceed.   Diagnosis: Schizophrenia, paranoid (HCC) [F20.0]    1. Schizophrenia, paranoid (HCC)       Randall Bastos, LCSW 07/04/2024

## 2024-07-04 NOTE — Psych (Signed)
 [facility] PHP THERAPIST PROGRESS NOTE  Matthew Padilla [id number]   Session Time: 9:00 am - 10:00 am  Participation Level: Minimal  Behavioral Response: Casual and GuardedAlertAnxious, Depressed, and Flat  Type of Therapy: Group Therapy  Treatment Goals addressed: Coping  Progress Towards Goals: Initial  Interventions: CBT, DBT, Solution Focused, Strength-based, Supportive, and Reframing  Therapist Response: Clinician led check-in regarding current stressors and situation, and review of patient completed daily inventory. Clinician utilized active listening and empathetic response and validated patient emotions. Clinician facilitated processing group on pertinent issues.?   Summary: Patient arrived within time allowed. Patient rates their depression at a 7 and anxiety at a 7 on a scale of 1-10 with 10 being best. Pt reports sleeping 8 hours and eating 3 times yesterday. Pt states he again went home after group and stayed in bed most of the day. Pt reports he did not engage in anything in bed and he experienced rumination and anxious thoughts Pt reports low energy and feeling exhausted by moving. Pt continues to only verbally participate when directly asked however has moments of facial engagement. Pt denies experiencing SI/SH thoughts and feelings of hopelessness.        Session Time: 10:00 am - 11:00 am  Participation Level: Minimal  Behavioral Response: Casual and GuardedAlertAnxious, Depressed, and flat  Type of Therapy: Group Therapy  Treatment Goals addressed: Coping  Progress Towards Goals: Initial  Interventions: CBT, DBT, Solution Focused, Strength-based, Supportive, and Reframing  Therapist Response: Clinician led discussion on focusing on what you can and cannot control. Cln utilized CBT to inform discussion of determining what is within our control and what is not and how to make an action step from what is within our control. Group engaged in discussion and talked  through group-provided examples to practice recognizing the difference between what can and cannot be controlled. Groups shared frustrations with the process and how they can utilize these processes when they feel stuck.   Summary: Pt reports understanding.       Session Time: 11:00 am - 12:00 pm  Participation Level: Minimally  Behavioral Response: CasualAlertAnxiousFlat  Type of Therapy: Group Therapy  Treatment Goals addressed: Coping  Progress Towards Goals: Initial  Interventions: CBT, DBT, Solution Focused, Strength-based, Supportive, and Reframing  Therapist Response: Cln continued topic of distress tolerance and introduced the Pushing Away distraction skill from DBT distress tolerance skill ACCEPTS. Cln discussed how this distraction skills can be helpful in situations where there is not an immediate space for an action step.   Summary: Pt minimally engaged in discussion      Session Time: 12:00 pm - 12:30 pm  Participation Level: Minimally  Behavioral Response: CasualAlertAnxiousFlat  Type of Therapy: Group Therapy  Treatment Goals addressed: Coping  Progress Towards Goals: Initial  Interventions: Psychologist, occupational, Supportive  Therapist Response: Reflection Group: Patients encouraged to practice skills and interpersonal techniques or work on mindfulness and relaxation techniques. The importance of self-care and making skills part of a routine to increase usage were stressed.  Summary: Patient minimally engaged and participated appropriately.      Session Time: 12:30 pm - 1:30 pm  Participation Level: Minimal  Behavioral Response: CasualAlertAnxiousFlat  Type of Therapy: Group Therapy  Treatment Goals addressed: Coping  Progress Towards Goals: Initial  Interventions: OT group  Therapist Response: Group was led by occupational therapist, Edward Hollan.   Summary: Pt minimally engaged and participated in  discussion.      Session Time: 1:30 pm - 2:00  pm  Participation Level: Minimal  Behavioral Response: CasualAlertAnxiousFlat  Type of Therapy: Group Therapy  Treatment Goals addressed: Coping  Progress Towards Goals: Initial  Interventions: CBT, DBT, Solution Focused, Strength-based, Supportive, and Reframing  Therapist Response: 1:30 pm - 1:50 pm: Cln led group focused on DBT mindfulness skills. Cln led activity utilizing I Spy bottles for practicing self-directing our attention and increasing awareness of our thought processes.  1:50 - 2:00 pm: Clinician led check-out. Clinician assessed for immediate needs, medication compliance and efficacy, and safety concerns?  Summary: 1:30 pm - 1:50 pm: Pt participated and minimally engaged in discussion.  1:50 - 2:00 pm: At check-out, patient reports no immediate concerns. Patient demonstrates progress as evidenced by continued engagement and responsiveness to treatment. Patient denies SI/HI/self-harm thoughts at the end of group.    Suicidal/Homicidal: Nowithout intent/plan  Plan: ?Pt will continue in PHP and medication management while continuing to work on decreasing depression symptoms,?SI, and anxiety symptoms,?and increasing the ability to self manage symptoms.   Collaboration of Care: Medication Management AEB Staci Kerns NP  Patient/Guardian was advised Release of Information must be obtained prior to any record release in order to collaborate their care with an outside provider. Patient/Guardian was advised if they have not already done so to contact the registration department to sign all necessary forms in order for us  to release information regarding their care.   Consent: Patient/Guardian gives verbal consent for treatment and assignment of benefits for services provided during this visit. Patient/Guardian expressed understanding and agreed to proceed.   Diagnosis: Schizophrenia, paranoid (HCC) [F20.0]    1.  Schizophrenia, paranoid (HCC)       Randall Bastos, LCSW 07/04/2024

## 2024-07-04 NOTE — Psych (Signed)
 [facility] PHP THERAPIST PROGRESS NOTE  Matthew Padilla [id number]   Session Time: 9:00 am - 10:00 am  Participation Level: Minimal  Behavioral Response: Casual and GuardedAlertAnxious, Depressed, and Flat  Type of Therapy: Group Therapy  Treatment Goals addressed: Coping  Progress Towards Goals: Initial  Interventions: CBT, DBT, Solution Focused, Strength-based, Supportive, and Reframing  Therapist Response: Clinician led check-in regarding current stressors and situation, and review of patient completed daily inventory. Clinician utilized active listening and empathetic response and validated patient emotions. Clinician facilitated processing group on pertinent issues.?   Summary: Patient arrived within time allowed. Patient rates their depression at a 7 and anxiety at a 7 on a scale of 1-10 with 10 being best. Pt reports sleeping 8 hours and eating 3 times yesterday. Pt states he rested after group. Pt shares he did engage in a task and watched 30 min of tv which he was pretty much able to pay attention to. Pt continues to only verbally participate when directly asked however has moments of facial engagement. Pt denies experiencing SI/SH thoughts and feelings of hopelessness.        Session Time: 10:00 am - 11:00 am  Participation Level: Minimal  Behavioral Response: Casual and GuardedAlertAnxious, Depressed, and flat  Type of Therapy: Group Therapy  Treatment Goals addressed: Coping  Progress Towards Goals: Initial  Interventions: CBT, DBT, Solution Focused, Strength-based, Supportive, and Reframing  Therapist Response: Cln led discussion on support groups and the role they can provide in recovery and ongoing treatment. Group discussed success and barriers they have had with past support groups or they have with considering support groups. Cln provided resources for local support groups.   Summary: Pt reports understanding.       Session Time: 11:00 am - 12:00  pm  Participation Level: Minimally  Behavioral Response: CasualAlertAnxiousFlat  Type of Therapy: Group Therapy  Treatment Goals addressed: Coping  Progress Towards Goals: Initial  Interventions: CBT, DBT, Solution Focused, Strength-based, Supportive, and Reframing  Therapist Response: Cln continued topic of DBT distress tolerance skills and the ACCEPTS distraction skill. Group reviewed E-T-S skills and discussed how they can practice them in their every day life.   Summary: Pt minimally engaged in discussion     Session Time: 12:30 pm - 1:30 pm  Participation Level: Minimal  Behavioral Response: CasualAlertAnxiousFlat  Type of Therapy: Group Therapy  Treatment Goals addressed: Coping  Progress Towards Goals: Initial  Interventions: OT group  Therapist Response: 12:00 - 12:50 Group was led by occupational therapist, Dallas Purpura.  12:50 - 1:00 pm: Clinician led check-out. Clinician assessed for immediate needs, medication compliance and efficacy, and safety concerns?  Summary: 12:00 pm - 12:50 pm: Pt minimally engaged in discussion.  12:50 - 1:00 pm: At check-out, patient reports no immediate concerns. Patient demonstrates progress as evidenced by continued engagement and responsiveness to treatment. Patient denies SI/HI/self-harm thoughts at the end of group.      Suicidal/Homicidal: Nowithout intent/plan  Plan: ?Pt will continue in PHP and medication management while continuing to work on decreasing depression symptoms,?SI, and anxiety symptoms,?and increasing the ability to self manage symptoms.   Collaboration of Care: Medication Management AEB Staci Kerns NP  Patient/Guardian was advised Release of Information must be obtained prior to any record release in order to collaborate their care with an outside provider. Patient/Guardian was advised if they have not already done so to contact the registration department to sign all necessary forms in order for us  to  release information regarding their  care.   Consent: Patient/Guardian gives verbal consent for treatment and assignment of benefits for services provided during this visit. Patient/Guardian expressed understanding and agreed to proceed.   Diagnosis: Schizophrenia, paranoid (HCC) [F20.0]    1. Schizophrenia, paranoid (HCC)       Randall Bastos, LCSW 07/04/2024

## 2024-07-04 NOTE — Progress Notes (Signed)
 Spoke with patient in person for weekly check in for PHP. States that groups are going well. He is feeling more confident and slightly more relaxed in a group setting. On scale 1-10 as 10 being worst he rates depression at 6 and anxiety at 7. Denies SI/HI or Visual hallucinations. Does continue to hear voices but nothing new or no commands. Pleasant, cooperative, with affect that brightens on approach. No side effects from medication. No issues or complaints.

## 2024-07-04 NOTE — Psych (Signed)
 [facility] BH PHP THERAPIST PROGRESS NOTE  Matthew Padilla [id number]   Session Time: 9:00 am - 10:00 am  Participation Level: Minimal  Behavioral Response: Casual and GuardedAlertAnxious and Depressed  Type of Therapy: Group Therapy  Treatment Goals addressed: Coping  Progress Towards Goals: Progressing minimally  Interventions: CBT, DBT, Solution Focused, Strength-based, Supportive, and Reframing  Therapist Response: Clinician led check-in regarding current stressors and situation, and review of patient completed daily inventory. Clinician utilized active listening and empathetic response and validated patient emotions. Clinician facilitated processing group on pertinent issues.?   Summary: Patient arrived within time allowed. Patient rates their depression at a 7 and anxiety at a 7 on a scale of 1-10 with 10 being best. Pt reports he watched The Sound of Music with his family after group yesterday and enjoyed it. When asked about sleep and appetite, pt reports he slept 8 hours last night and ate 3 meals ysterday. Pt denied experiencing SI/SH thoughts and feelings of hopelessness since last session. Pt able to process.?Pt engaged in discussion when prompted.?      Session Time: 10:00 am - 11:00 am  Participation Level: Minimal  Behavioral Response: Casual and GuardedAlertAnxious and Depressed  Type of Therapy: Group Therapy  Treatment Goals addressed: Coping  Progress Towards Goals: Progressing minimally  Interventions: CBT, DBT, Solution Focused, Strength-based, Supportive, and Reframing  Therapist Response: Clinician led processing group for pt's current struggles. Group members shared stressors and provided support and feedback. Clinician brought in topics of cognitive distortions and self-compassion to inform discussion.  Summary: Pt able to process when prompted. He shares thoughts and feelings related to anxiety in public places. He is receptive to reframing suggestions by  cln.    Session Time: 11:00 am - 12:00 pm  Participation Level: Minimal  Behavioral Response: Casual and GuardedAlertAnxious and Depressed  Type of Therapy: Group Therapy  Treatment Goals addressed: Coping  Progress Towards Goals: Progressing minimally  Interventions: CBT, DBT, Solution Focused, Strength-based, Supportive, and Reframing  Therapist Response: Group was led by [facility] chaplain, Greig Daring.  Summary: Pt participated when prompted and demonstrated paucity of speech when responding.   Session Time: 12:00 pm - 12:30 pm  Participation Level: None  Behavioral Response: Casual and GuardedAlertAnxious and Depressed  Type of Therapy: Group Therapy  Treatment Goals addressed: Coping  Progress Towards Goals: Progressing minimally  Interventions: Psychologist, occupational, Supportive  Therapist Response: Reflection Group: Patients encouraged to practice skills and interpersonal techniques or work on mindfulness and relaxation techniques. The importance of self-care and making skills part of a routine to increase usage were stressed.  Summary: Patient engaged and participated appropriately.   Session Time: 12:30 pm - 1:30 pm  Participation Level: Minimal  Behavioral Response: Casual and GuardedAlertAnxious and Depressed  Type of Therapy: Group Therapy  Treatment Goals addressed: Coping  Progress Towards Goals: Progressing minimally  Interventions: CBT, DBT, Solution Focused, Strength-based, Supportive, and Reframing  Therapist Response: Group viewed Ted Talk entitled How To Not Take Things Personally presented by Wendelin Bolt. Cln utilized CBT principles to inform discussion while pts were encouraged to share their response to the video. Cln asked each patient to share a time in which they took things personally and reframed the situation based on what was shared in the [place] Talk.   Summary: Pt participated in discussion when prompted and demonstrated  paucity of speech when responding. Pt was unable to identify a time in which he took something personally. Cln assisted with this by asking more  specific questions that were based on other statements pt has made in group. Cln providing reframing examples, to which pt was receptive. Patients were dismissed at 1:30 pm due to OT being absent. At check-out, patient contracts for safety.?Patient demonstrates limited progress as evidenced by his lack of engagement and by being receptive to treatment. Patient denies SI/HI/self-harm thoughts at the end of group and agrees to seek help should those thoughts/feelings occur.?  Suicidal/Homicidal: Nowithout intent/plan  Plan: ?Pt will continue in PHP and medication management while continuing to work on decreasing depression symptoms,?SI, and anxiety symptoms,?and increasing the ability to self manage symptoms.     Collaboration of Care: Other RN Hildegard Macadam  Patient/Guardian was advised Release of Information must be obtained prior to any record release in order to collaborate their care with an outside provider. Patient/Guardian was advised if they have not already done so to contact the registration department to sign all necessary forms in order for us  to release information regarding their care.   Consent: Patient/Guardian gives verbal consent for treatment and assignment of benefits for services provided during this visit. Patient/Guardian expressed understanding and agreed to proceed.   Diagnosis: Schizophrenia, paranoid (HCC) [F20.0]    1. Schizophrenia, paranoid [facility])       [name], LCSW 07/04/2024

## 2024-07-05 ENCOUNTER — Ambulatory Visit (INDEPENDENT_AMBULATORY_CARE_PROVIDER_SITE_OTHER): Admitting: Licensed Clinical Social Worker

## 2024-07-05 DIAGNOSIS — F200 Paranoid schizophrenia: Secondary | ICD-10-CM | POA: Diagnosis not present

## 2024-07-06 ENCOUNTER — Encounter (HOSPITAL_COMMUNITY): Payer: Self-pay

## 2024-07-06 ENCOUNTER — Ambulatory Visit (INDEPENDENT_AMBULATORY_CARE_PROVIDER_SITE_OTHER): Admitting: Licensed Clinical Social Worker

## 2024-07-06 DIAGNOSIS — F200 Paranoid schizophrenia: Secondary | ICD-10-CM

## 2024-07-06 NOTE — Therapy (Signed)
 [facility] [address] [place], [place], [address] Phone: [phone]   Fax:  [phone]  Occupational Therapy Evaluation  Patient Details  Name: Matthew Padilla MRN: [id number] Date of Birth: 1995/10/21 No data recorded  Encounter Date: 06/27/2024   OT End of Session - 07/06/24 1810     Visit Number 1    Number of Visits 20    Date for Recertification  07/25/24    OT Start Time 1230    OT Stop Time 1330    OT Time Calculation (min) 60 min          Past Medical History:  Diagnosis Date   Anxiety    Low testosterone  in male    Paranoid schizophrenia (HCC)    Schizophrenia (HCC)     Past Surgical History:  Procedure Laterality Date   LASIK Bilateral 2018    There were no vitals filed for this visit.   Subjective Assessment - 07/06/24 1808     Subjective  I want to work on new coping skills while I am here in therapy    Pertinent History schizophrenia, paranoid; anxiety    Currently in Pain? No/denies    Pain Score 0-No pain            OT Assessment  OCAIRS Mental Health Interview Summary of Client Scores:  Facilitates participation in occupation Allows participation in occupation Inhibits participation in occupation Restricts participation in occupation Comments:  Roles   X    Habits   X    Personal Causation   X    Values  X     Interests    X   Skills   X    Short-Term Goals   X    Long-term Goals    X   Interpretation of Past Experiences   X    Physical Environment  X     Social Environment  X     Readiness for Change   X      Need for Occupational Therapy:  4 Shows positive occupational participation, no need for OT.   3 Need for minimal intervention/consultative participation   2 Need for OT intervention indicated to restore/improve participation   1 Need for extensive OT intervention indicated to improve participation.  Referral for follow up services also recommended.   Assessment:  Patient  demonstrates behavior that INHIBITS participation in occupation.  Patient will benefit from occupational therapy intervention in order to improve time management, financial management, stress management, job readiness skills, social skills, and health management skills in preparation to return to full time community living and to be a productive community member.    Plan:  Patient will participate in skilled occupational therapy sessions individually or in a group setting to improve coping skills, psychosocial skills, and emotional skills required to return to prior level of function. Treatment will be 4-5 times per week for 4 weeks.     Group Session:  O: Patient participated in Meaningful Leisure and Role Building exercise. Activity involved selecting and planning a preferred leisure or role task, practicing task steps, and reflecting on barriers and successes. Goal was to improve initiation, sustained attention, sequencing, problem solving, and role performance to support daily functioning and occupational balance.   A: Patient was physically present for Meaningful Leisure activity but showed limited initiation and minimal spontaneous participation. Required frequent verbal prompts to engage.  OT Education - 07/06/24 1809     Education Details OCAIRS / Meaningful Leisure and Role Building    Person(s) Educated Patient    Methods Explanation;Handout    Comprehension Verbalized understanding           OT Short Term Goals - 07/06/24 1812       OT SHORT TERM GOAL #1   Title By the time of discharge, client will independently set, track, and make progress towards a long-term goal, demonstrating resilience in overcoming obstacles and seeking support when needed.    Time 4    Period Weeks    Status On-going    Target Date 07/25/24      OT SHORT TERM GOAL #2   Title Client will independently identify and modify three areas of the current routine that  contribute to increased stress or dysfunction by the end of therapy.    Status On-going      OT SHORT TERM GOAL #3   Title Client will independently identify and list three personal triggers that lead to heightened stress or negative emotional responses by the end of 3 sessions.    Status On-going                   Plan - 07/06/24 1811     Clinical Impression Statement Pt presents w/ deficits across multiple psychosocial doamins that inhibit participation in daily tasks and overall iccupational performance    OT Occupational Profile and History Problem Focused Assessment - Including review of records relating to presenting problem    Occupational performance deficits (Please refer to evaluation for details): IADL's;Rest and Sleep;Leisure;Social Participation    Psychosocial Skills Coping Strategies;Habits;Interpersonal Interaction;Routines and Behaviors    Rehab Potential Good    Clinical Decision Making Limited treatment options, no task modification necessary    Comorbidities Affecting Occupational Performance: None    Modification or Assistance to Complete Evaluation  No modification of tasks or assist necessary to complete eval    OT Frequency 5x / week    OT Duration 4 weeks    OT Treatment/Interventions Psychosocial skills training;Coping strategies training          Patient will benefit from skilled therapeutic intervention in order to improve the following deficits and impairments:       Psychosocial Skills: Coping Strategies, Habits, Interpersonal Interaction, Routines and Behaviors   Visit Diagnosis: Difficulty coping  Schizophrenia, paranoid (HCC)  PTSD (post-traumatic stress disorder)  Social anxiety disorder  Anxiety    Problem List Patient Active Problem List   Diagnosis Date Noted   Morbid obesity (HCC) 05/24/2023   Insomnia 05/24/2023   Asthma 05/24/2023   Seasonal allergies 05/24/2023   Anxiety 05/24/2023   Schizophrenia, paranoid (HCC)  04/18/2023   Hypogonadism in male 01/15/2021    [place] KANDICE Purpura, OT 07/06/2024, 6:14 PM  Dallas Purpura, OT   [place] [facility] [address] [place], [place], [address] Phone: [phone]   Fax:  [phone]  Name: Calogero Geisen MRN: [id number] Date of Birth: May 26, 1996

## 2024-07-06 NOTE — Therapy (Signed)
 [place] [facility] [address] [place], [place], [address] Phone: [phone]   Fax:  [phone]  Occupational Therapy Treatment  Patient Details  Name: Jacobo Moncrief MRN: [id number] Date of Birth: Oct 02, 1996 No data recorded  Encounter Date: 06/28/2024   OT End of Session - 07/06/24 1857     Visit Number 2    Number of Visits 20    Date for Recertification  07/25/24    OT Start Time 1230    OT Stop Time 1330    OT Time Calculation (min) 60 min    Activity Tolerance Patient tolerated treatment well    Behavior During Therapy [employer] for tasks assessed/performed          Past Medical History:  Diagnosis Date   Anxiety    Low testosterone  in male    Paranoid schizophrenia (HCC)    Schizophrenia (HCC)     Past Surgical History:  Procedure Laterality Date   LASIK Bilateral 2018    There were no vitals filed for this visit.   Subjective Assessment - 07/06/24 1856     Currently in Pain? No/denies    Pain Score 0-No pain    Multiple Pain Sites No             Group Session:  S: Doing okay today. I slept for almost 8 hours last night.   O: The objective of today's group is to provide a comprehensive understanding of the concept of motivation and its role in human behavior and well-being. The content covers various theories of motivation, including intrinsic and extrinsic motivators, and explores the psychological mechanisms that drive individuals to achieve goals, overcome obstacles, and make decisions. By diving into real-world applications, the group aims to offer actionable strategies for enhancing motivation in different life domains, such as work, relationships, and personal growth.  Utilizing a multi-disciplinary approach, this group integrates insights from psychology, neuroscience, and behavioral economics to present a holistic view of motivation. The objective is not only to educate the audience about the complexities and  driving forces behind motivation but also to equip them with practical tools and techniques to improve their own motivation levels. By the end of this multi-day group, patient's should have a well-rounded understanding of what motivates human actions and how to harness this knowledge for personal and professional betterment.   A: The patient attends the session but shows minimal active involvement in the discussion or activities. They listen to the information provided but do not ask questions or volunteer personal experiences that could deepen their understanding of motivation. Their body language, such as limited eye contact and minimal verbal feedback, indicates a passive engagement level, suggesting a potential barrier to fully benefiting from the educational content. The patient's passive approach may require more directed intervention to ensure they grasp and can apply the principles of motivation to their life.   P: Continue to attend PHP OT group sessions 5x week for 4 weeks to promote daily structure, social engagement, and opportunities to develop and utilize adaptive strategies to maximize functional performance in preparation for safe transition and integration back into school, work, and the community. Plan to address topic of pt 2 in next OT group session.                   OT Education - 07/06/24 1856     Education Details Motivation 1           OT Short Term Goals - 07/06/24 1812  OT SHORT TERM GOAL #1   Title By the time of discharge, client will independently set, track, and make progress towards a long-term goal, demonstrating resilience in overcoming obstacles and seeking support when needed.    Time 4    Period Weeks    Status On-going    Target Date 07/25/24      OT SHORT TERM GOAL #2   Title Client will independently identify and modify three areas of the current routine that contribute to increased stress or dysfunction by the end of therapy.     Status On-going      OT SHORT TERM GOAL #3   Title Client will independently identify and list three personal triggers that lead to heightened stress or negative emotional responses by the end of 3 sessions.    Status On-going                   Plan - 07/06/24 1857     Psychosocial Skills Coping Strategies;Habits;Interpersonal Interaction;Routines and Behaviors          Patient will benefit from skilled therapeutic intervention in order to improve the following deficits and impairments:       Psychosocial Skills: Coping Strategies, Habits, Interpersonal Interaction, Routines and Behaviors   Visit Diagnosis: Difficulty coping    Problem List Patient Active Problem List   Diagnosis Date Noted   Morbid obesity (HCC) 05/24/2023   Insomnia 05/24/2023   Asthma 05/24/2023   Seasonal allergies 05/24/2023   Anxiety 05/24/2023   Schizophrenia, paranoid (HCC) 04/18/2023   Hypogonadism in male 01/15/2021    D[place] KANDICE Purpura, OT 07/06/2024, 6:58 PM  Dallas Purpura, OT   [place] [facility] [address] [place], [place], [address] Phone: [phone]   Fax:  [phone]  Name: Peregrine Nolt MRN: [id number] Date of Birth: 1996-09-19

## 2024-07-06 NOTE — Therapy (Signed)
 [facility] [address] [place], [place], [address] Phone: [phone]   Fax:  [phone]  Occupational Therapy Treatment  Patient Details  Name: Matthew Padilla MRN: [id number] Date of Birth: [birth year] No data recorded  Encounter Date: 06/29/2024   OT End of Session - 07/06/24 2149     Visit Number 3    Number of Visits 20    Date for Recertification  07/25/24    OT Start Time 1230    OT Stop Time 1330    OT Time Calculation (min) 60 min    Activity Tolerance Patient tolerated treatment well    Behavior During Therapy [facility] for tasks assessed/performed          Past Medical History:  Diagnosis Date   Anxiety    Low testosterone  in male    Paranoid schizophrenia (HCC)    Schizophrenia (HCC)     Past Surgical History:  Procedure Laterality Date   LASIK Bilateral 2018    There were no vitals filed for this visit.   Subjective Assessment - 07/06/24 2148     Currently in Pain? No/denies    Pain Score 0-No pain    Multiple Pain Sites No             Group Session:  S: Doing fine today. Nothing I want to talk about specifically.   O: The objective of today's group is to provide a comprehensive understanding of the concept of motivation and its role in human behavior and well-being. The content covers various theories of motivation, including intrinsic and extrinsic motivators, and explores the psychological mechanisms that drive individuals to achieve goals, overcome obstacles, and make decisions. By diving into real-world applications, the group aims to offer actionable strategies for enhancing motivation in different life domains, such as work, relationships, and personal growth.  Utilizing a multi-disciplinary approach, this group integrates insights from psychology, neuroscience, and behavioral economics to present a holistic view of motivation. The objective is not only to educate the audience about the complexities and  driving forces behind motivation but also to equip them with practical tools and techniques to improve their own motivation levels. By the end of this multi-day group, patient's should have a well-rounded understanding of what motivates human actions and how to harness this knowledge for personal and professional betterment.   A: The patient attends the session but shows minimal active involvement in the discussion or activities. They listen to the information provided but do not ask questions or volunteer personal experiences that could deepen their understanding of motivation. Their body language, such as limited eye contact and minimal verbal feedback, indicates a passive engagement level, suggesting a potential barrier to fully benefiting from the educational content. The patient's passive approach may require more directed intervention to ensure they grasp and can apply the principles of motivation to their life.   P: Continue to attend PHP OT group sessions 5x week for 4 weeks to promote daily structure, social engagement, and opportunities to develop and utilize adaptive strategies to maximize functional performance in preparation for safe transition and integration back into school, work, and the community. Plan to address topic of tbd in next OT group session.                   OT Education - 07/06/24 2149     Education Details Motivation 2    Person(s) Educated Patient    Methods Explanation;Handout    Comprehension Verbalized understanding  OT Short Term Goals - 07/06/24 1812       OT SHORT TERM GOAL #1   Title By the time of discharge, client will independently set, track, and make progress towards a long-term goal, demonstrating resilience in overcoming obstacles and seeking support when needed.    Time 4    Period Weeks    Status On-going    Target Date 07/25/24      OT SHORT TERM GOAL #2   Title Client will independently identify and modify three  areas of the current routine that contribute to increased stress or dysfunction by the end of therapy.    Status On-going      OT SHORT TERM GOAL #3   Title Client will independently identify and list three personal triggers that lead to heightened stress or negative emotional responses by the end of 3 sessions.    Status On-going                   Plan - 07/06/24 2149     Psychosocial Skills Coping Strategies;Habits;Interpersonal Interaction;Routines and Behaviors          Patient will benefit from skilled therapeutic intervention in order to improve the following deficits and impairments:       Psychosocial Skills: Coping Strategies, Habits, Interpersonal Interaction, Routines and Behaviors   Visit Diagnosis: Difficulty coping    Problem List Patient Active Problem List   Diagnosis Date Noted   Morbid obesity (HCC) 05/24/2023   Insomnia 05/24/2023   Asthma 05/24/2023   Seasonal allergies 05/24/2023   Anxiety 05/24/2023   Schizophrenia, paranoid (HCC) 04/18/2023   Hypogonadism in male 01/15/2021    D[place] KANDICE Purpura, OT 07/06/2024, 9:50 PM  Dallas Purpura, OT   [place] [facility] [address] [place], [place], [address] Phone: [phone]   Fax:  [phone]  Name: Matthew Padilla MRN: [id number] Date of Birth: 1996/05/19

## 2024-07-07 ENCOUNTER — Encounter (HOSPITAL_COMMUNITY): Payer: Self-pay

## 2024-07-07 ENCOUNTER — Ambulatory Visit (INDEPENDENT_AMBULATORY_CARE_PROVIDER_SITE_OTHER): Admitting: Licensed Clinical Social Worker

## 2024-07-07 DIAGNOSIS — F200 Paranoid schizophrenia: Secondary | ICD-10-CM | POA: Diagnosis not present

## 2024-07-07 NOTE — Therapy (Signed)
 [place] [facility] [address] [place], [place], [address] Phone: [phone]   Fax:  [phone]  Occupational Therapy Treatment  Patient Details  Name: Matthew Padilla MRN: [id number] Date of Birth: [birth year] No data recorded  Encounter Date: 06/30/2024   OT End of Session - 07/07/24 1023     Visit Number 4    Number of Visits 20    Date for Recertification  07/25/24    OT Start Time 1200    OT Stop Time 1250    OT Time Calculation (min) 50 min          Past Medical History:  Diagnosis Date   Anxiety    Low testosterone  in male    Paranoid schizophrenia (HCC)    Schizophrenia (HCC)     Past Surgical History:  Procedure Laterality Date   LASIK Bilateral 2018    There were no vitals filed for this visit.   Subjective Assessment - 07/07/24 1023     Currently in Pain? No/denies    Pain Score 0-No pain    Multiple Pain Sites No              Group Session:  S: Doing good today.   O: The objective of today's group is to provide a comprehensive understanding of the concept of motivation and its role in human behavior and well-being. The content covers various theories of motivation, including intrinsic and extrinsic motivators, and explores the psychological mechanisms that drive individuals to achieve goals, overcome obstacles, and make decisions. By diving into real-world applications, the group aims to offer actionable strategies for enhancing motivation in different life domains, such as work, relationships, and personal growth.  Utilizing a multi-disciplinary approach, this group integrates insights from psychology, neuroscience, and behavioral economics to present a holistic view of motivation. The objective is not only to educate the audience about the complexities and driving forces behind motivation but also to equip them with practical tools and techniques to improve their own motivation levels. By the end of this  multi-day group, patient's should have a well-rounded understanding of what motivates human actions and how to harness this knowledge for personal and professional betterment.   A: The patient attends the session but shows minimal active involvement in the discussion or activities. They listen to the information provided but do not ask questions or volunteer personal experiences that could deepen their understanding of motivation. Their body language, such as limited eye contact and minimal verbal feedback, indicates a passive engagement level, suggesting a potential barrier to fully benefiting from the educational content. The patient's passive approach may require more directed intervention to ensure they grasp and can apply the principles of motivation to their life.   P: Continue to attend PHP OT group sessions 5x week for 4 weeks to promote daily structure, social engagement, and opportunities to develop and utilize adaptive strategies to maximize functional performance in preparation for safe transition and integration back into school, work, and the community. Plan to address topic of tbd in next OT group session.                  OT Education - 07/07/24 1023     Education Details Motivation    Person(s) Educated Patient    Methods Explanation;Handout    Comprehension Verbalized understanding           OT Short Term Goals - 07/06/24 1812       OT SHORT TERM GOAL #1  Title By the time of discharge, client will independently set, track, and make progress towards a long-term goal, demonstrating resilience in overcoming obstacles and seeking support when needed.    Time 4    Period Weeks    Status On-going    Target Date 07/25/24      OT SHORT TERM GOAL #2   Title Client will independently identify and modify three areas of the current routine that contribute to increased stress or dysfunction by the end of therapy.    Status On-going      OT SHORT TERM GOAL #3    Title Client will independently identify and list three personal triggers that lead to heightened stress or negative emotional responses by the end of 3 sessions.    Status On-going                   Plan - 07/07/24 1023     Psychosocial Skills Coping Strategies;Habits;Interpersonal Interaction;Routines and Behaviors          Patient will benefit from skilled therapeutic intervention in order to improve the following deficits and impairments:       Psychosocial Skills: Coping Strategies, Habits, Interpersonal Interaction, Routines and Behaviors   Visit Diagnosis: Difficulty coping    Problem List Patient Active Problem List   Diagnosis Date Noted   Morbid obesity (HCC) 05/24/2023   Insomnia 05/24/2023   Asthma 05/24/2023   Seasonal allergies 05/24/2023   Anxiety 05/24/2023   Schizophrenia, paranoid (HCC) 04/18/2023   Hypogonadism in male 01/15/2021    D[place] KANDICE Purpura, OT 07/07/2024, 10:24 AM  Dallas Purpura, OT   [place] [facility] [address] [place], [place], [address] Phone: [phone]   Fax:  [phone]  Name: Matthew Padilla MRN: [id number] Date of Birth: 1996-08-15

## 2024-07-08 NOTE — Progress Notes (Cosign Needed Addendum)
 BH MD/PA/NP OP Progress Note  07/03/2024 9:07 AM Sue Mcalexander  MRN:  [id number]  Chief Complaint:   I am doing okay.  HPI:-Keola Behrmann 28 year old male who presents after referral by Lynwood HERO.  Therapist.  Patient reports he has a history related to major depressive disorder and generalized anxiety disorder.  Chart reviewed other diagnoses noted paranoid schizophrenia and insomnia.  HPI: Nicoli Nardozzi 28 year old male was seen and evaluated for weekly progress assessment while attending partial hospitalization outpatient programming.  Continues to be very minimal throughout this assessment.  Reports overall group is  going okay reports he likes socialization and the structure that group setting is providing.  States he has learned skills such as the except acronym to work on activities.  He reports a good appetite.  States he is rested well throughout the night.  Reporting his depression and anxiety 7 out of 10 with 10 being the worst.  States he continues to take Clomid , hydroxyzine  and metformin  reported taking Clomid  for low testosterone .  He denies suicidal or homicidal ideations.  Denies auditory or visual hallucinations.    Visit Diagnosis:    ICD-10-CM   1. Schizophrenia, paranoid (HCC)  F20.0       Past Psychiatric History:   Past Medical History:  Past Medical History:  Diagnosis Date   Anxiety    Low testosterone  in male    Paranoid schizophrenia (HCC)    Schizophrenia (HCC)     Past Surgical History:  Procedure Laterality Date   LASIK Bilateral 2018    Family Psychiatric History:   Family History: No family history on file.  Social History:  Social History   Socioeconomic History   Marital status: Single    Spouse name: Not on file   Number of children: 0   Years of education: Not on file   Highest education level: Some college, no degree  Occupational History   Not on file  Tobacco Use   Smoking status: Never   Smokeless tobacco: Never   Vaping Use   Vaping status: Never Used  Substance and Sexual Activity   Alcohol use: Not Currently   Drug use: Yes    Types: Other-see comments   Sexual activity: Never  Other Topics Concern   Not on file  Social History Narrative   Not on file   Social Drivers of Health   Financial Resource Strain: Not on file  Food Insecurity: Patient Declined (04/19/2023)   Hunger Vital Sign    Worried About Running Out of Food in the Last Year: Patient declined    Ran Out of Food in the Last Year: Patient declined  Transportation Needs: Patient Declined (04/19/2023)   PRAPARE - Administrator, Civil Service (Medical): Patient declined    Lack of Transportation (Non-Medical): Patient declined  Physical Activity: Not on file  Stress: Not on file  Social Connections: Not on file    Allergies: No Known Allergies  Metabolic Disorder Labs: Lab Results  Component Value Date   HGBA1C 5.4 04/22/2023   MPG 108 04/22/2023   Lab Results  Component Value Date   PROLACTIN 25.5 (H) 12/27/2023   PROLACTIN 22.3 (H) 09/11/2022   Lab Results  Component Value Date   CHOL 126 04/22/2023   TRIG 113 04/22/2023   HDL 21 (L) 04/22/2023   CHOLHDL 6.0 04/22/2023   VLDL 23 04/22/2023   LDLCALC 82 04/22/2023   Lab Results  Component Value Date   TSH 2.72 12/27/2023   TSH  1.98 09/11/2022    Therapeutic Level Labs: No results found for: LITHIUM No results found for: VALPROATE No results found for: CBMZ  Current Medications: Current Outpatient Medications  Medication Sig Dispense Refill   albuterol  (VENTOLIN  HFA) 108 (90 Base) MCG/ACT inhaler Inhale 2 puffs into the lungs every 6 (six) hours as needed for wheezing or shortness of breath. 1 each 0   clomiPHENE  (CLOMID ) 50 MG tablet Take 1 tablet (50 mg total) by mouth daily. 90 tablet 3   hydrOXYzine  (ATARAX ) 25 MG tablet Take 1 tablet (25 mg total) by mouth at bedtime and may repeat dose one time if needed. 45 tablet 1    loratadine  (CLARITIN ) 10 MG tablet Take 1 tablet (10 mg total) by mouth daily. (Patient not taking: Reported on 06/28/2024)     metFORMIN  (GLUCOPHAGE -XR) 500 MG 24 hr tablet Take 1 tablet (500 mg total) by mouth daily with breakfast. 90 tablet 3   PARoxetine  (PAXIL ) 20 MG tablet Take 1 tablet (20 mg total) by mouth daily. 30 tablet 1   risperiDONE  (RISPERDAL ) 3 MG tablet Take 1 tablet (3 mg total) by mouth 2 (two) times daily. 60 tablet 1   No current facility-administered medications for this visit.     Musculoskeletal: Strength & Muscle Tone: within normal limits Gait & Station: normal Patient leans: N/A  Psychiatric Specialty Exam: Review of Systems  There were no vitals taken for this visit.There is no height or weight on file to calculate BMI.  General Appearance: Casual flat and  guarded  Eye Contact:  Good  Speech:  Clear and Coherent  Volume:  Normal  Mood:  Depressed  Affect:  Congruent  Thought Process:  Coherent  Orientation:  Full (Time, Place, and Person)  Thought Content: Logical   Suicidal Thoughts:  No  Homicidal Thoughts:  No  Memory:  Immediate;   Good Remote;   Good  Judgement:  Good  Insight:  Good  Psychomotor Activity:  Normal  Concentration:  Concentration: Good  Recall:  Good  Fund of Knowledge: Good  Language: Good  Akathisia:  No  Handed:  Right  AIMS (if indicated): not done  Assets:  Communication Skills Desire for Improvement  ADL's:  Intact  Cognition: WNL  Sleep:  Fair   Screenings: AIMS    Flowsheet Row [facility]  AIMS Total Score 0   GAD-7    Flowsheet Row Counselor from 07/03/2024 in [facility] Counselor from 06/13/2024 in [facility] [place] Counselor from 05/30/2024 in [facility] [place] Counselor from 04/26/2024 in [facility]  [place] Counselor from 03/30/2024 in [facility]  Total GAD-7 Score 16 14 15 12 17    PHQ2-9    Flowsheet Row Counselor from 07/03/2024 in [facility] Counselor from 06/28/2024 in [facility] Counselor from 06/19/2024 in [facility] Counselor from 06/13/2024 in [facility] from 05/30/2024 in [facility] Total Score 5 5 5 3 6   PHQ-9 Total Score 12 14 13 12 16    Flowsheet Row Counselor from 06/28/2024 in [facility] Counselor from 06/19/2024 in [facility] Counselor from 12/23/2023 in [facility] [facility] RISK CATEGORY No Risk No Risk  No Risk     Assessment and Plan:  Continue partial hospitalization programming - Continue medications as directed  Collaboration of Care: Collaboration of Care: Medication Management AEB Risperdal  3 mg bid   Patient/Guardian was advised Release of Information must be obtained prior to any record release in order to collaborate their care with an outside provider. Patient/Guardian was advised if they have not already done so to contact the registration department to sign all necessary forms in order for us  to release information regarding their care.   Consent: Patient/Guardian gives verbal consent for treatment and assignment of benefits for services provided during this visit. Patient/Guardian expressed understanding and agreed to proceed.    Staci LOISE Kerns, NP 07/03/2024, 9:07 AM

## 2024-07-10 ENCOUNTER — Ambulatory Visit (HOSPITAL_COMMUNITY)

## 2024-07-10 ENCOUNTER — Ambulatory Visit (INDEPENDENT_AMBULATORY_CARE_PROVIDER_SITE_OTHER)

## 2024-07-10 DIAGNOSIS — F200 Paranoid schizophrenia: Secondary | ICD-10-CM | POA: Diagnosis not present

## 2024-07-10 DIAGNOSIS — R4589 Other symptoms and signs involving emotional state: Secondary | ICD-10-CM

## 2024-07-10 DIAGNOSIS — F419 Anxiety disorder, unspecified: Secondary | ICD-10-CM

## 2024-07-11 ENCOUNTER — Ambulatory Visit (HOSPITAL_COMMUNITY)

## 2024-07-11 ENCOUNTER — Ambulatory Visit (INDEPENDENT_AMBULATORY_CARE_PROVIDER_SITE_OTHER): Admitting: Professional

## 2024-07-11 DIAGNOSIS — R4589 Other symptoms and signs involving emotional state: Secondary | ICD-10-CM

## 2024-07-11 DIAGNOSIS — F200 Paranoid schizophrenia: Secondary | ICD-10-CM

## 2024-07-11 NOTE — Progress Notes (Unsigned)
 Spoke with patient in person for PHP weekly check. States that groups are going OK but still has a hard time talking in a group setting. Has a lot of anxiety about being in groups but it's a little better. On scale 1-10 as 10 being worst he rates depression at 7 and anxiety at 8. Denies SI/HI or AVH. No side effects from medication. No issues or complaints.

## 2024-07-12 ENCOUNTER — Ambulatory Visit (HOSPITAL_COMMUNITY)

## 2024-07-12 ENCOUNTER — Ambulatory Visit (INDEPENDENT_AMBULATORY_CARE_PROVIDER_SITE_OTHER): Admitting: Licensed Clinical Social Worker

## 2024-07-12 DIAGNOSIS — F200 Paranoid schizophrenia: Secondary | ICD-10-CM

## 2024-07-12 DIAGNOSIS — R4589 Other symptoms and signs involving emotional state: Secondary | ICD-10-CM

## 2024-07-12 NOTE — Progress Notes (Signed)
 BH MD/PA/NP OP Progress Note  07/12/2024 3:19 PM Matthew Padilla  MRN:  [id number]  Chief Complaint: Weekly progress follow-up assessment  HPI: Matthew Padilla 28 year old male currently attending [facility] urgent care outpatient partial hospitalization programming.  Carries a diagnosis with schizophrenia paranoid, major depressive and generalized anxiety disorder.  Prescribed Paxil  20 mg daily and Risperdal  3 mg p.o. twice daily which he reports he is taking and tolerating well.  Denying any medication side effects.  Patient is very minimal guarded throughout this assessment.  Denied concerns related to delusional thought content suicidal or homicidal ideation.  Denied auditory visual hallucinations.  [name] currently rating his depression and anxiety symptoms 6 out of  10 with 10 being the worst.  He reports a good appetite.  States he is rested well throughout the night.  Reports he utilizes hydroxyzine  occasionally.  Unable to identify any strategies or additional coping skills that he can utilize this week.  Patient to continue current medication regimen.  No other documented concerns noted at this visit.   Visit Diagnosis:    ICD-10-CM   1. Schizophrenia, paranoid (HCC)  F20.0     2. Anxiety  F41.9       Past Psychiatric History:  Past Medical History:  Past Medical History:  Diagnosis Date   Anxiety    Low testosterone  in male    Paranoid schizophrenia (HCC)    Schizophrenia (HCC)     Past Surgical History:  Procedure Laterality Date   LASIK Bilateral 2018    Family Psychiatric History:   Family History: No family history on file.  Social History:  Social History   Socioeconomic History   Marital status: Single    Spouse name: Not on file   Number of children: 0   Years of education: Not on file   Highest education level: Some college, no degree  Occupational History   Not on file  Tobacco Use   Smoking status: Never   Smokeless tobacco: Never  Vaping  Use   Vaping status: Never Used  Substance and Sexual Activity   Alcohol use: Not Currently   Drug use: Yes    Types: Other-see comments   Sexual activity: Never  Other Topics Concern   Not on file  Social History Narrative   Not on file   Social Drivers of Health   Financial Resource Strain: Not on file  Food Insecurity: Patient Declined (04/19/2023)   Hunger Vital Sign    Worried About Running Out of Food in the Last Year: Patient declined    Ran Out of Food in the Last Year: Patient declined  Transportation Needs: Patient Declined (04/19/2023)   PRAPARE - Administrator, Civil Service (Medical): Patient declined    Lack of Transportation (Non-Medical): Patient declined  Physical Activity: Not on file  Stress: Not on file  Social Connections: Not on file    Allergies: No Known Allergies  Metabolic Disorder Labs: Lab Results  Component Value Date   HGBA1C 5.4 04/22/2023   MPG 108 04/22/2023   Lab Results  Component Value Date   PROLACTIN 25.5 (H) 12/27/2023   PROLACTIN 22.3 (H) 09/11/2022   Lab Results  Component Value Date   CHOL 126 04/22/2023   TRIG 113 04/22/2023   HDL 21 (L) 04/22/2023   CHOLHDL 6.0 04/22/2023   VLDL 23 04/22/2023   LDLCALC 82 04/22/2023   Lab Results  Component Value Date   TSH 2.72 12/27/2023   TSH 1.98 09/11/2022  Therapeutic Level Labs: No results found for: LITHIUM No results found for: VALPROATE No results found for: CBMZ  Current Medications: Current Outpatient Medications  Medication Sig Dispense Refill   albuterol  (VENTOLIN  HFA) 108 (90 Base) MCG/ACT inhaler Inhale 2 puffs into the lungs every 6 (six) hours as needed for wheezing or shortness of breath. 1 each 0   clomiPHENE  (CLOMID ) 50 MG tablet Take 1 tablet (50 mg total) by mouth daily. 90 tablet 3   hydrOXYzine  (ATARAX ) 25 MG tablet Take 1 tablet (25 mg total) by mouth at bedtime and may repeat dose one time if needed. 45 tablet 1   loratadine   (CLARITIN ) 10 MG tablet Take 1 tablet (10 mg total) by mouth daily. (Patient not taking: Reported on 07/11/2024)     metFORMIN  (GLUCOPHAGE -XR) 500 MG 24 hr tablet Take 1 tablet (500 mg total) by mouth daily with breakfast. 90 tablet 3   PARoxetine  (PAXIL ) 20 MG tablet Take 1 tablet (20 mg total) by mouth daily. 30 tablet 1   risperiDONE  (RISPERDAL ) 3 MG tablet Take 1 tablet (3 mg total) by mouth 2 (two) times daily. 60 tablet 1   No current facility-administered medications for this visit.     Musculoskeletal: Strength & Muscle Tone: within normal limits Gait & Station: normal Patient leans: N/A  Psychiatric Specialty Exam: Review of Systems  There were no vitals taken for this visit.There is no height or weight on file to calculate BMI.  General Appearance: Casual, flat and guarded  Eye Contact:  Good  Speech:  Clear and Coherent  Volume:  Normal  Mood:  Anxious and Depressed   Affect:  Blunt  Thought Process:  Coherent  Orientation:  Full (Time, Place, and Person)  Thought Content: Logical   Suicidal Thoughts:  No  Homicidal Thoughts:  No  Memory:  Immediate;   Fair Remote;   Fair  Judgement:  Good  Insight:  Good  Psychomotor Activity:  Normal  Concentration:  Concentration: Fair  Recall:  Good  Fund of Knowledge: Good  Language: Fair  Akathisia:  No  Handed:  Right  AIMS (if indicated): not done  Assets:  Communication Skills Desire for Improvement  ADL's:  Intact  Cognition: WNL  Sleep:  Good   Screenings: AIMS    Flowsheet Row [facility]  AIMS Total Score 0   GAD-7    Flowsheet Row Counselor from 07/03/2024 in [facility] Counselor from 06/13/2024 in [facility] [facility] [place] Counselor from 05/30/2024 in [facility] [place] Counselor from 04/26/2024 in [facility]  [place] Counselor from 03/30/2024 in [facility]  Total GAD-7 Score 16 14 15 12 17    PHQ2-9    Flowsheet Row Counselor from 07/03/2024 in [facility] Counselor from 06/28/2024 in [facility] Counselor from 06/19/2024 in [facility] Counselor from 06/13/2024 in [facility] from 05/30/2024 in [facility] Total Score 5 5 5 3 6   PHQ-9 Total Score 12 14 13 12 16    Flowsheet Row Counselor from 06/28/2024 in [facility] Counselor from 06/19/2024 in [facility] Counselor from 12/23/2023 in [place] [facility] RISK CATEGORY No Risk No Risk No Risk  Assessment and Plan:  Continue partial hospitalization programming (PHP) - Continue medications as directed  Collaboration of Care: Collaboration of Care: Psychiatrist AEB Afreen, MD   Patient/Guardian was advised Release of Information must be obtained prior to any record release in order to collaborate their care with an outside provider. Patient/Guardian was advised if they have not already done so to contact the registration department to sign all necessary forms in order for us  to release information regarding their care.   Consent: Patient/Guardian gives verbal consent for treatment and assignment of benefits for services provided during this visit. Patient/Guardian expressed understanding and agreed to proceed.    Staci LOISE Kerns, NP 07/12/2024, 3:19 PM

## 2024-07-13 ENCOUNTER — Ambulatory Visit (INDEPENDENT_AMBULATORY_CARE_PROVIDER_SITE_OTHER): Admitting: Licensed Clinical Social Worker

## 2024-07-13 ENCOUNTER — Ambulatory Visit (HOSPITAL_COMMUNITY)

## 2024-07-13 DIAGNOSIS — F200 Paranoid schizophrenia: Secondary | ICD-10-CM

## 2024-07-13 DIAGNOSIS — R4589 Other symptoms and signs involving emotional state: Secondary | ICD-10-CM

## 2024-07-13 NOTE — Psych (Signed)
 [facility] BH PHP THERAPIST PROGRESS NOTE  Matthew Padilla [id number]   Session Time: 9:00 am - 10:00 am  Participation Level: Minimal  Behavioral Response: Casual and GuardedAlertAnxious, Depressed, and Flat  Type of Therapy: Group Therapy  Treatment Goals addressed: Coping  Progress Towards Goals: Progressing  Interventions: CBT, DBT, Solution Focused, Strength-based, Supportive, and Reframing  Therapist Response: Clinician led check-in regarding current stressors and situation, and review of patient completed daily inventory. Clinician utilized active listening and empathetic response and validated patient emotions. Clinician facilitated processing group on pertinent issues.?   Summary: Patient arrived within time allowed. Patient rates their depression at a 7 and anxiety at a 7 on a scale of 1-10 with 10 being best. Pt reports sleeping 8 hours and eating 3 times yesterday. Pt states he had a good weekend. He took a walk with his Mom and Dad, watched football with his Dad and Sister. Pt responds to questions when asked directly with minimal elaboration. Pt is observed to be engaged in group AEB small facial responses.  Pt denied experiencing SI/SH thoughts and feelings of hopelessness. Pt reports goal of doing laundry today.      Session Time: 10:00 am - 11:00 am  Participation Level: Minimal  Behavioral Response: Casual and GuardedAlertAnxious, Depressed, and flat  Type of Therapy: Group Therapy  Treatment Goals addressed: Coping  Progress Towards Goals: Progressing  Interventions: CBT, DBT, Solution Focused, Strength-based, Supportive, and Reframing  Therapist Response: Clinician led discussion on black and white thinking, and living in the gray. Group discussed the strength of finding self-worth in self instead of in what others expect/want. Group discussed distraction skills and having a variety to choose from for different scenarios.     Summary: Pt able to process and  make connections in discussion.       Session Time: 11:00 am - 12:00 pm  Participation Level: Minimally  Behavioral Response: CasualAlertAnxiousFlat  Type of Therapy: Group Therapy  Treatment Goals addressed: Coping  Progress Towards Goals: Progressing  Interventions: CBT, DBT, Solution Focused, Strength-based, Supportive, and Reframing  Therapist Response: Clinician introduced topic of Positive Psychology. Group watched Positive Psychology Ted-Talk. Patients discussed how their lens of life affects the way they feel. Group discussed and practiced 5 strategies to help change lens.    Summary: Pt engaged in discussion when prompted.    Session Time: 12:00 pm - 12:30 pm  Participation Level: Minimally  Behavioral Response: CasualAlertAnxiousFlat  Type of Therapy: Group Therapy  Treatment Goals addressed: Coping  Progress Towards Goals: Progressing  Interventions: Psychologist, occupational, Supportive  Therapist Response: Reflection Group: Patients encouraged to practice skills and interpersonal techniques or work on mindfulness and relaxation techniques. The importance of self-care and making skills part of a routine to increase usage were stressed.  Summary: Patient minimally engaged and participated appropriately.      Session Time: 12:30 pm - 1:30 pm  Participation Level: Minimal  Behavioral Response: CasualAlertAnxiousFlat  Type of Therapy: Group Therapy  Treatment Goals addressed: Coping  Progress Towards Goals: Progressing  Interventions: OT group  Therapist Response: Group was led by occupational therapist, Edward Hollan.   Summary: Pt minimally engaged and participated in discussion.      Session Time: 1:30 pm - 2:00 pm  Participation Level: Minimal  Behavioral Response: CasualAlertAnxiousFlat  Type of Therapy: Group Therapy  Treatment Goals addressed: Coping  Progress Towards Goals: Progressing  Interventions: CBT, DBT,  Solution Focused, Strength-based, Supportive, and Reframing  Therapist Response: 1:30 pm - 1:50 pm: Cln  continued topic of positive psychology. Pt identified 1 of the 5 activities discussed to try to change lens.  1:50 - 2:00 pm: Clinician led check-out. Clinician assessed for immediate needs, medication compliance and efficacy, and safety concerns?   Summary: 1:30 pm - 1:50 pm: Pt participated and engaged in discussion. Pt reports he will try 2 minutes of exercise daily to change lens.   1:50 - 2:00 pm: At check-out, patient reports no immediate concerns. Patient demonstrates progress as evidenced by engagement and responsiveness to treatment. Patient denies SI/HI/self-harm thoughts at the end of group.    Suicidal/Homicidal: Nowithout intent/plan  Plan: ?Pt will continue in PHP and medication management while continuing to work on decreasing depression symptoms,?SI, and anxiety symptoms,?and increasing the ability to self manage symptoms.   Collaboration of Care: Medication Management AEB Staci Kerns NP  Patient/Guardian was advised Release of Information must be obtained prior to any record release in order to collaborate their care with an outside provider. Patient/Guardian was advised if they have not already done so to contact the registration department to sign all necessary forms in order for us  to release information regarding their care.   Consent: Patient/Guardian gives verbal consent for treatment and assignment of benefits for services provided during this visit. Patient/Guardian expressed understanding and agreed to proceed.   Diagnosis: Schizophrenia, paranoid (HCC) [F20.0]    1. Schizophrenia, paranoid (HCC)   2. Anxiety       Benton JINNY Devoid, [facility] 07/10/24

## 2024-07-13 NOTE — Psych (Signed)
 [facility] BH PHP THERAPIST PROGRESS NOTE  Tomasz Steeves [id number]   Session Time: 9:00 am - 10:00 am  Participation Level: Minimal  Behavioral Response: Casual and GuardedAlertAnxious, Depressed, and Flat  Type of Therapy: Group Therapy  Treatment Goals addressed: Coping  Progress Towards Goals: Progressing  Interventions: CBT, DBT, Solution Focused, Strength-based, Supportive, and Reframing  Therapist Response: Clinician led check-in regarding current stressors and situation, and review of patient completed daily inventory. Clinician utilized active listening and empathetic response and validated patient emotions. Clinician facilitated processing group on pertinent issues.?   Summary: Patient arrived within time allowed. Patient rates their depression at a 7 and anxiety at a 7 on a scale of 1-10 with 10 being best. When asked about sleep and appetite, pt reports he slept 8 hours last night and ate 3 meals yesterday. Pt reports he walked 1 mile with Dad and ate [employer] for dinner. He took a nap and watched TV. Pt denied experiencing SI/SH thoughts and feelings of hopelessness. Pt engaged when prompted.      Session Time: 10:00 am - 11:00 am  Participation Level: Minimal  Behavioral Response: Casual and GuardedAlertAnxious, Depressed, and flat  Type of Therapy: Group Therapy  Treatment Goals addressed: Coping  Progress Towards Goals: Progressing  Interventions: CBT, DBT, Solution Focused, Strength-based, Supportive, and Reframing  Therapist Response: Clinician led discussion on how to increase supports in real-life vs online. Group discussed wearing a mask for others and how to refill own self-love/self-care cup while not depending on others to identify self-worth.  Summary: Pt engaged when prompted.     Session Time: 11:00 am - 12:00 pm  Participation Level: Minimal  Behavioral Response: Casual and GuardedAlertAnxious, Depressed, and flat  Type of Therapy: Group  Therapy  Treatment Goals addressed: Coping  Progress Towards Goals: Progressing  Interventions: CBT, DBT, Solution Focused, Strength-based, Supportive, and Reframing  Therapist Response: Group was led by [facility] chaplain, Greig Daring.  Summary: Pt engaged when prompted.    Session Time: 12:00 pm - 12:30 pm  Participation Level: None  Behavioral Response: Casual and GuardedAlertAnxious, Depressed, and flat  Type of Therapy: Group Therapy  Treatment Goals addressed: Coping  Progress Towards Goals: Progressing  Interventions: Psychologist, occupational, Supportive  Therapist Response: Reflection Group: Patients encouraged to practice skills and interpersonal techniques or work on mindfulness and relaxation techniques. The importance of self-care and making skills part of a routine to increase usage were stressed.  Summary: Pt engaged when prompted.    Session Time: 12:30 pm - 1:30 pm   Participation Level: Minimal   Behavioral Response: CasualAlertAnxious   Type of Therapy: Group Therapy   Treatment Goals addressed: Coping   Progress Towards Goals: Progressing   Interventions: OT group   Therapist Response: Group was led by occupational therapist, Edward Hollan.    Summary: Pt engaged when prompted.            Session Time: 1:30 pm - 1:45 pm   Participation Level: Active   Behavioral Response: CasualAlertAnxious   Type of Therapy: Group Therapy   Treatment Goals addressed: Coping   Progress Towards Goals: Progressing   Interventions: CBT, DBT, Solution Focused, Strength-based, Supportive, and Reframing   Therapist Response: 1:30 - 1:45 pm: Clinician led check-out. Clinician assessed for immediate needs, medication compliance and efficacy, and safety concerns?   Summary: 1:30 pm - 1:45 pm: At check-out, patient reports no immediate concerns. Patient demonstrates progress as evidenced by engagement and responsiveness to treatment. Patient denies  SI/HI/self-harm  thoughts at the end of group.   Suicidal/Homicidal: Nowithout intent/plan  Plan: ?Pt will continue in PHP and medication management while continuing to work on decreasing depression symptoms,?SI, and anxiety symptoms,?and increasing the ability to self manage symptoms.   Collaboration of Care: Medication Management AEB Staci Kerns NP  Patient/Guardian was advised Release of Information must be obtained prior to any record release in order to collaborate their care with an outside provider. Patient/Guardian was advised if they have not already done so to contact the registration department to sign all necessary forms in order for us  to release information regarding their care.   Consent: Patient/Guardian gives verbal consent for treatment and assignment of benefits for services provided during this visit. Patient/Guardian expressed understanding and agreed to proceed.   Diagnosis: Schizophrenia, paranoid (HCC) [F20.0]    1. Schizophrenia, paranoid (HCC)       Benton JINNY Devoid, [facility] 07/11/2024

## 2024-07-14 ENCOUNTER — Ambulatory Visit (INDEPENDENT_AMBULATORY_CARE_PROVIDER_SITE_OTHER): Admitting: Licensed Clinical Social Worker

## 2024-07-14 ENCOUNTER — Ambulatory Visit (HOSPITAL_COMMUNITY)

## 2024-07-14 DIAGNOSIS — F200 Paranoid schizophrenia: Secondary | ICD-10-CM

## 2024-07-14 DIAGNOSIS — R4589 Other symptoms and signs involving emotional state: Secondary | ICD-10-CM

## 2024-07-14 NOTE — Psych (Signed)
 [facility] BH PHP THERAPIST PROGRESS NOTE  Matthew Padilla [id number]   Session Time: 9:00 am - 10:00 am  Participation Level: Minimal  Behavioral Response: Casual and GuardedAlertAnxious and Depressed  Type of Therapy: Group Therapy  Treatment Goals addressed: Coping  Progress Towards Goals: Progressing minimally  Interventions: CBT, DBT, Solution Focused, Strength-based, Supportive, and Reframing  Therapist Response: Clinician led check-in regarding current stressors and situation, and review of patient completed daily inventory. Clinician utilized active listening and empathetic response and validated patient emotions. Clinician facilitated processing group on pertinent issues.?   Summary: Patient arrived within time allowed. Patient rates their depression at a 7  and anxiety at a 7 on a scale of 1-10 with 10 being best. Pt reports he has been walking 1 mile every day with his father because he told him about the 21 day goal and his father wants to walk with him. Pt denies noticing any changes in sleep, mood, or energy level. When asked about sleep and appetite, pt reports they slept 8 hours last night and ate 3 meals yesterday. Pt denied experiencing SI/SH thoughts since last session. Pt able to process.?Pt engaged in discussion.?      Session Time: 10:00 am - 11:00 am  Participation Level: None  Behavioral Response: Casual and GuardedAlertAnxious and Depressed  Type of Therapy: Group Therapy  Treatment Goals addressed: Coping  Progress Towards Goals: Progressing minimally  Interventions: CBT, DBT, Solution Focused, Strength-based, Supportive, and Reframing  Therapist Response: Clinician led processing group for pt's current struggles. Group members shared stressors and provided support and feedback. Clinician brought in topics of self-worth to inform discussion.  Summary: Pt did not participate in discussion.    Session Time: 11:00 am - 12:00 pm  Participation Level:  Minimal  Behavioral Response: Casual and GuardedAlertAnxious and Depressed  Type of Therapy: Group Therapy  Treatment Goals addressed: Coping  Progress Towards Goals: Progressing minimally  Interventions: CBT, DBT, Solution Focused, Strength-based, Supportive, and Reframing  Therapist Response: Clinician guided patients through an exercise called "perspective-taking" in which patients were asked to visualize themselves holding onto something that is currently causing them pain and to think of it as a memory. Patients were invited to share their thoughts and feelings after the exercise was completed. Clinician utilized ACT principles to inform discussion.  Summary: Pt engaged in discussion when prompted. Pt reports difficulty with visualization and shares how he experienced the exercise. Pt demonstrates fair insight into the subject matter and smiled when cln and group brought in humor during discussion.   Session Time: 12:00 pm - 1:00 pm  Participation Level: Active  Behavioral Response: Casual and GuardedAlertAnxious and Depressed  Type of Therapy: Group Therapy  Treatment Goals addressed: Coping  Progress Towards Goals: Progressing minimally  Interventions: CBT, DBT, Solution Focused, Strength-based, Supportive, and Reframing  Therapist Response: 12:00 pm - 12:50 pm: Group was led by occupational therapist, Dallas Purpura. 12:50 - 1:00 pm: Clinician led check-out. Clinician assessed for immediate needs, medication compliance and efficacy, and safety concerns?  Summary: 12:00 pm - 12:50 pm: Pt participated in discussion when prompted. 12:50 - 1:00 pm: At check-out, patient contracts for safety.?Patient demonstrates progress as evidenced by his continued attendance and by being receptive to treatment. Patient denies SI/HI/self-harm thoughts at the end of group and agrees to seek help should those thoughts/feelings occur.?   Suicidal/Homicidal: Nowithout intent/plan  Plan: ?Pt will  continue in PHP and medication management while continuing to work on decreasing depression symptoms,?SI, and anxiety symptoms,?and increasing the  ability to self manage symptoms.     Collaboration of Care: Other none required for this visit  Patient/Guardian was advised Release of Information must be obtained prior to any record release in order to collaborate their care with an outside provider. Patient/Guardian was advised if they have not already done so to contact the registration department to sign all necessary forms in order for us  to release information regarding their care.   Consent: Patient/Guardian gives verbal consent for treatment and assignment of benefits for services provided during this visit. Patient/Guardian expressed understanding and agreed to proceed.   Diagnosis: Schizophrenia, paranoid (HCC) [F20.0]    1. Schizophrenia, paranoid [facility])       [name], LCSW 07/14/2024

## 2024-07-16 ENCOUNTER — Encounter (HOSPITAL_COMMUNITY): Payer: Self-pay

## 2024-07-16 NOTE — Therapy (Signed)
 [facility] [address] [place], [place], [address] Phone: [phone]   Fax:  [phone]  Occupational Therapy Treatment  Patient Details  Name: Matthew Padilla MRN: [id number] Date of Birth: 1996/05/17 No data recorded  Encounter Date: 07/10/2024   OT End of Session - 07/16/24 2128     Visit Number 5    Number of Visits 20    Date for Recertification  07/25/24    OT Start Time 1230    OT Stop Time 1330    OT Time Calculation (min) 60 min    Activity Tolerance Patient tolerated treatment well    Behavior During Therapy [facility] for tasks assessed/performed          Past Medical History:  Diagnosis Date   Anxiety    Low testosterone  in male    Paranoid schizophrenia (HCC)    Schizophrenia (HCC)     Past Surgical History:  Procedure Laterality Date   LASIK Bilateral 2018    There were no vitals filed for this visit.   Subjective Assessment - 07/16/24 2128     Currently in Pain? No/denies    Pain Score 0-No pain              Group Session:  S: Doing fine. Slept good, eight hours last night. Took the walk with my dad.   O: During the group therapy session, the occupational therapist discussed the impact of sleep disturbances on daily activities and overall health and wellbeing.   The OT also reviewed various types of sleep disorders, including insomnia, sleep apnea, restless leg syndrome, and narcolepsy, and their associated symptoms. Strategies for managing and treating sleep disturbances were also discussed, such as establishing a consistent sleep routine, avoiding stimulants before bedtime, and engaging in relaxation techniques.   Today's group also included information on how sleep disturbances can cause fatigue, mood changes, cognitive impairment, and physical health problems, and emphasizes the importance of seeking prompt treatment to maintain overall health and wellbeing.   A: In today's session, the patient  demonstrated active engagement with the topic of The Importance of Sleep. They eagerly asked questions, contributed personal experiences, and showcased a noticeable eagerness to apply the discussed principles. Their participation indicated not only a strong understanding of the subject matter but also an intrinsic motivation to implement better sleep practices in their daily routine. Based on their proactive involvement, it is assessed that the patient greatly benefited from today's treatment and will likely make efforts to incorporate the insights gained.    P: Continue to attend PHP OT group sessions 5x week for 4 weeks to promote daily structure, social engagement, and opportunities to develop and utilize adaptive strategies to maximize functional performance in preparation for safe transition and integration back into school, work, and the community. Plan to address topic of tbd in next OT group session.                  OT Education - 07/16/24 2128     Education Details Sleep Hygiene           OT Short Term Goals - 07/06/24 1812       OT SHORT TERM GOAL #1   Title By the time of discharge, client will independently set, track, and make progress towards a long-term goal, demonstrating resilience in overcoming obstacles and seeking support when needed.    Time 4    Period Weeks    Status On-going    Target Date  07/25/24      OT SHORT TERM GOAL #2   Title Client will independently identify and modify three areas of the current routine that contribute to increased stress or dysfunction by the end of therapy.    Status On-going      OT SHORT TERM GOAL #3   Title Client will independently identify and list three personal triggers that lead to heightened stress or negative emotional responses by the end of 3 sessions.    Status On-going                   Plan - 07/16/24 2128     Psychosocial Skills Coping Strategies;Habits;Interpersonal Interaction;Routines  and Behaviors          Patient will benefit from skilled therapeutic intervention in order to improve the following deficits and impairments:       Psychosocial Skills: Coping Strategies, Habits, Interpersonal Interaction, Routines and Behaviors   Visit Diagnosis: Difficulty coping    Problem List Patient Active Problem List   Diagnosis Date Noted   Morbid obesity (HCC) 05/24/2023   Insomnia 05/24/2023   Asthma 05/24/2023   Seasonal allergies 05/24/2023   Anxiety 05/24/2023   Schizophrenia, paranoid (HCC) 04/18/2023   Hypogonadism in male 01/15/2021    Dallas KANDICE Purpura, OT 07/16/2024, 9:29 PM  Dallas Purpura, OT   [facility] [address] [place], [place], [address] Phone: [phone]   Fax:  [phone]  Name: Christy Friede MRN: [id number] Date of Birth: 13-Nov-1995

## 2024-07-17 ENCOUNTER — Ambulatory Visit (HOSPITAL_COMMUNITY)

## 2024-07-17 ENCOUNTER — Encounter (HOSPITAL_COMMUNITY): Payer: Self-pay

## 2024-07-17 NOTE — Therapy (Signed)
 [facility] [address] [place], [place], [address] Phone: [phone]   Fax:  [phone]  Occupational Therapy Treatment  Patient Details  Name: Matthew Padilla MRN: [id number] Date of Birth: 1996-07-13 No data recorded  Encounter Date: 07/11/2024   OT End of Session - 07/17/24 1224     Visit Number 6    Number of Visits 20    Date for Recertification  07/25/24    OT Start Time 1230    OT Stop Time 1330    OT Time Calculation (min) 60 min          Past Medical History:  Diagnosis Date   Anxiety    Low testosterone  in male    Paranoid schizophrenia (HCC)    Schizophrenia (HCC)     Past Surgical History:  Procedure Laterality Date   LASIK Bilateral 2018    There were no vitals filed for this visit.   Subjective Assessment - 07/17/24 1224     Currently in Pain? No/denies    Pain Score 0-No pain                Group Session:  S: Doing fine today.   O: During the group therapy session, the occupational therapist discussed the impact of sleep disturbances on daily activities and overall health and wellbeing.   The OT also reviewed various types of sleep disorders, including insomnia, sleep apnea, restless leg syndrome, and narcolepsy, and their associated symptoms. Strategies for managing and treating sleep disturbances were also discussed, such as establishing a consistent sleep routine, avoiding stimulants before bedtime, and engaging in relaxation techniques.   Today's group also included information on how sleep disturbances can cause fatigue, mood changes, cognitive impairment, and physical health problems, and emphasizes the importance of seeking prompt treatment to maintain overall health and wellbeing.   A: In today's session, the patient demonstrated active engagement with the topic of The Importance of Sleep. They eagerly asked questions, contributed personal experiences, and showcased a noticeable eagerness  to apply the discussed principles. Their participation indicated not only a strong understanding of the subject matter but also an intrinsic motivation to implement better sleep practices in their daily routine. Based on their proactive involvement, it is assessed that the patient greatly benefited from today's treatment and will likely make efforts to incorporate the insights gained.  P: Continue to attend PHP OT group sessions 5x week for 4 weeks to promote daily structure, social engagement, and opportunities to develop and utilize adaptive strategies to maximize functional performance in preparation for safe transition and integration back into school, work, and the community. Plan to address topic of tbd in next OT group session.                OT Education - 07/17/24 1224     Education Details Sleep Hygiene           OT Short Term Goals - 07/06/24 1812       OT SHORT TERM GOAL #1   Title By the time of discharge, client will independently set, track, and make progress towards a long-term goal, demonstrating resilience in overcoming obstacles and seeking support when needed.    Time 4    Period Weeks    Status On-going    Target Date 07/25/24      OT SHORT TERM GOAL #2   Title Client will independently identify and modify three areas of the current routine that contribute to increased stress or  dysfunction by the end of therapy.    Status On-going      OT SHORT TERM GOAL #3   Title Client will independently identify and list three personal triggers that lead to heightened stress or negative emotional responses by the end of 3 sessions.    Status On-going                   Plan - 07/17/24 1224     Psychosocial Skills Coping Strategies;Habits;Interpersonal Interaction;Routines and Behaviors          Patient will benefit from skilled therapeutic intervention in order to improve the following deficits and impairments:       Psychosocial Skills:  Coping Strategies, Habits, Interpersonal Interaction, Routines and Behaviors   Visit Diagnosis: Difficulty coping    Problem List Patient Active Problem List   Diagnosis Date Noted   Morbid obesity (HCC) 05/24/2023   Insomnia 05/24/2023   Asthma 05/24/2023   Seasonal allergies 05/24/2023   Anxiety 05/24/2023   Schizophrenia, paranoid (HCC) 04/18/2023   Hypogonadism in male 01/15/2021    Dallas KANDICE Purpura, OT 07/17/2024, 12:25 PM  Dallas Purpura, OT   [facility] [address] [place], [place], [address] Phone: [phone]   Fax:  [phone]  Name: Matthew Padilla MRN: [id number] Date of Birth: 1996-06-05

## 2024-07-18 ENCOUNTER — Ambulatory Visit (HOSPITAL_COMMUNITY)

## 2024-07-18 ENCOUNTER — Ambulatory Visit (INDEPENDENT_AMBULATORY_CARE_PROVIDER_SITE_OTHER): Admitting: Licensed Clinical Social Worker

## 2024-07-18 DIAGNOSIS — R4589 Other symptoms and signs involving emotional state: Secondary | ICD-10-CM

## 2024-07-18 DIAGNOSIS — F200 Paranoid schizophrenia: Secondary | ICD-10-CM

## 2024-07-18 NOTE — Psych (Signed)
 [facility] BH PHP THERAPIST PROGRESS NOTE  Matthew Padilla [id number]   Session Time: 9:00 am - 10:00 am  Participation Level: Minimal  Behavioral Response: Casual and GuardedAlertAnxious and Depressed  Type of Therapy: Group Therapy  Treatment Goals addressed: Coping  Progress Towards Goals: Progressing minimally  Interventions: CBT, DBT, Solution Focused, Strength-based, Supportive, and Reframing  Therapist Response: Clinician led check-in regarding current stressors and situation, and review of patient completed daily inventory. Clinician utilized active listening and empathetic response and validated patient emotions. Clinician facilitated processing group on pertinent issues.?   Summary: Patient arrived within time allowed. Patient rates their depression at a 7 and anxiety at a 7 on a scale of 1-10 with 10 being best. Pt reports he went for a walk with his father and watched football over the weekend. When asked about sleep and appetite, pt reports they slept 8 hours last night and ate 3 meals yesterday. Pt denied experiencing SI/SH thoughts and feelings of hopelessness since last session. Cln pointed out that pt's answers are the same every day and asked about this. Pt shrugged and did not elaborate further.     Session Time: 10:00 am - 11:00 am  Participation Level: None  Behavioral Response: Casual and GuardedAlertAnxious and Depressed  Type of Therapy: Group Therapy  Treatment Goals addressed: Coping  Progress Towards Goals: Progressing minimally  Interventions: CBT, DBT, Solution Focused, Strength-based, Supportive, and Reframing  Therapist Response: Clinician led processing group for pt's current struggles. Group members shared stressors and provided support and feedback. Clinician brought in topics of grief and loss to inform discussion.  Summary: Pt did not participate in discussion.    Session Time: 11:00 am - 12:00 pm  Participation Level:  Minimal  Behavioral Response: Casual and GuardedAlertAnxious and Depressed  Type of Therapy: Group Therapy  Treatment Goals addressed: Coping  Progress Towards Goals: Progressing minimally  Interventions: CBT, DBT, Solution Focused, Strength-based, Supportive, and Reframing  Therapist Response: Group was led by [facility] chaplain, Matthew Padilla.  Summary: Pt participated in discussion when prompted and demonstrated paucity of speech.   Session Time: 12:00 pm - 12:30 pm  Participation Level: None  Behavioral Response: Casual and GuardedAlertAnxious and Depressed  Type of Therapy: Group Therapy  Treatment Goals addressed: Coping  Progress Towards Goals: Progressing minimally  Interventions: Psychologist, occupational, Supportive  Therapist Response: Reflection Group: Patients encouraged to practice skills and interpersonal techniques or work on mindfulness and relaxation techniques. The importance of self-care and making skills part of a routine to increase usage were stressed.  Summary: Patient did not engage with peers.   Session Time: 12:30 pm - 1:30 pm  Participation Level: Minimal  Behavioral Response: Casual and GuardedAlertAnxious and Depressed  Type of Therapy: Group Therapy  Treatment Goals addressed: Coping  Progress Towards Goals: Progressing minimally  Interventions: CBT, DBT, Solution Focused, Strength-based, Supportive, and Reframing  Therapist Response: Group was led by occupational therapist, Matthew Padilla.   Summary: Pt participated in discussion and demonstrated paucity of speech.   Session Time: 1:30 pm - 2:00 pm  Participation Level: Minimal  Behavioral Response: Casual and GuardedAlertAnxious and Depressed  Type of Therapy: Group Therapy  Treatment Goals addressed: Coping  Progress Towards Goals: Progressing minimally  Interventions: CBT, DBT, Solution Focused, Strength-based, Supportive, and Reframing  Therapist Response: 1:30 pm -  1:50 pm: Clinician utilized a guided meditation video led by yoga instructor Matthew Padilla that focuses on stillness for stress relief. Patients were invited to share their  responses upon completion. 1:50 - 2:00 pm: Clinician led check-out. Clinician assessed for immediate needs, medication compliance and efficacy, and safety concerns?  Summary: 1:30 pm - 1:50 pm: Pt participated in activity. 1:50 - 2:00 pm: At check-out, patient contracts for safety.?Patient demonstrates minimal progress as evidenced by his limited engagement and by being receptive to treatment. Patient denies SI/HI/self-harm thoughts at the end of group and agrees to seek help should those thoughts/feelings occur.?    Suicidal/Homicidal: Nowithout intent/plan  Plan: ?Pt will continue in PHP and medication management while continuing to work on decreasing depression symptoms,?SI, and anxiety symptoms,?and increasing the ability to self manage symptoms.    Collaboration of Care: Medication Management AEB Matthew Kerns, NP  Patient/Guardian was advised Release of Information must be obtained prior to any record release in order to collaborate their care with an outside provider. Patient/Guardian was advised if they have not already done so to contact the registration department to sign all necessary forms in order for us  to release information regarding their care.   Consent: Patient/Guardian gives verbal consent for treatment and assignment of benefits for services provided during this visit. Patient/Guardian expressed understanding and agreed to proceed.   Diagnosis: Schizophrenia, paranoid (HCC) [F20.0]    1. Schizophrenia, paranoid [facility])       [name], LCSW 07/18/2024

## 2024-07-18 NOTE — Progress Notes (Signed)
 Spoke with patient in person for PHP. States that group are going well. He finds that it is getting better each week and not so anxious about them. On scale 1-10 as 10 being worst he rates depression at 7 and anxiety at 6. [place] he feels kind of down today for no reason. PHQ9=9. This week is his last in [facility] and he will return to his regular therapy visits. No side effects from medication. No issues or complaints.

## 2024-07-19 ENCOUNTER — Ambulatory Visit (HOSPITAL_COMMUNITY)

## 2024-07-19 ENCOUNTER — Ambulatory Visit (INDEPENDENT_AMBULATORY_CARE_PROVIDER_SITE_OTHER): Admitting: Licensed Clinical Social Worker

## 2024-07-19 DIAGNOSIS — F200 Paranoid schizophrenia: Secondary | ICD-10-CM

## 2024-07-19 DIAGNOSIS — R4589 Other symptoms and signs involving emotional state: Secondary | ICD-10-CM

## 2024-07-19 NOTE — Progress Notes (Signed)
  [facility] Partial outpatient Program Discharge Summary  Matthew Padilla [id number]  Admission date: 06/27/2024 Discharge date: 07/19/2024  Reason for admission: per admission assessment note:   Matthew Padilla 28 year old male who presents after referral by Lynwood HERO.  Therapist.  Patient reports he has a history related to major depressive disorder and generalized anxiety disorder.  Chart reviewed other diagnoses noted paranoid schizophrenia and insomnia.  He reports taking Risperdal  and hydroxyzine  as directed.  States he currently resides with his parents.  Denies that he is currently employed.  Reports his last inpatient admission was a few months ago.  He denied history related to self injures behaviors or suicidal ideations.    Progress in Program Toward Treatment Goals: Progressing Tom was seen and evaluated face-to-face at discharge.  Denied any concerns at this visit.  No concerns related to suicidal or homicidal ideations.  Denies auditory visual hallucinations.  Reports he has been taking medication as directed.  Reports he enjoyed learning the acronym except  to help with distracting techniques.  Support, encouragement  and reassurance was provided.  Progress (rationale): Has plans to follow-up with his outpatient providers psychiatrist Arfeen and James Moses for therapy services he declined medication refills at this time. Treatment team reported minimal engagement and participation throughout this admission.  Continues to be flat, guarded but pleasant.  Collaboration of Care: Medication Management AEB continue Risperdal , hydroxyzine  as indicated  Patient/Guardian was advised Release of Information must be obtained prior to any record release in order to collaborate their care with an outside provider. Patient/Guardian was advised if they have not already done so to contact the registration department to sign all necessary forms in order for us  to release  information regarding their care.   Consent: Patient/Guardian gives verbal consent for treatment and assignment of benefits for services provided during this visit. Patient/Guardian expressed understanding and agreed to proceed.    Staci Kerns 07/19/2024

## 2024-07-20 ENCOUNTER — Ambulatory Visit (HOSPITAL_COMMUNITY)

## 2024-07-20 ENCOUNTER — Ambulatory Visit (HOSPITAL_COMMUNITY): Admitting: Psychiatry

## 2024-07-21 ENCOUNTER — Ambulatory Visit (HOSPITAL_COMMUNITY)

## 2024-07-23 ENCOUNTER — Encounter (HOSPITAL_COMMUNITY): Payer: Self-pay

## 2024-07-23 NOTE — Therapy (Signed)
 [facility] [address] [place], [place], [address] Phone: [phone]   Fax:  [phone]  Occupational Therapy Treatment  Patient Details  Name: Matthew Padilla MRN: [id number] Date of Birth: Sep 21, 1996 No data recorded  Encounter Date: 07/12/2024   OT End of Session - 07/23/24 2206     Visit Number 7    Number of Visits 20    Date for Recertification  07/25/24    OT Start Time 1230    OT Stop Time 1330    OT Time Calculation (min) 60 min          Past Medical History:  Diagnosis Date   Anxiety    Low testosterone  in male    Paranoid schizophrenia (HCC)    Schizophrenia (HCC)     Past Surgical History:  Procedure Laterality Date   LASIK Bilateral 2018    There were no vitals filed for this visit.   Subjective Assessment - 07/23/24 2206     Currently in Pain? No/denies    Pain Score 0-No pain              Group Session:  S: Doing good. I slept eight full hours last night.   O: During the group therapy session, the occupational therapist discussed the impact of sleep disturbances on daily activities and overall health and wellbeing.   The OT also reviewed various types of sleep disorders, including insomnia, sleep apnea, restless leg syndrome, and narcolepsy, and their associated symptoms. Strategies for managing and treating sleep disturbances were also discussed, such as establishing a consistent sleep routine, avoiding stimulants before bedtime, and engaging in relaxation techniques.   Today's group also included information on how sleep disturbances can cause fatigue, mood changes, cognitive impairment, and physical health problems, and emphasizes the importance of seeking prompt treatment to maintain overall health and wellbeing.   A: In today's session, the patient demonstrated active engagement with the topic of The Importance of Sleep. They eagerly asked questions, contributed personal experiences, and  showcased a noticeable eagerness to apply the discussed principles. Their participation indicated not only a strong understanding of the subject matter but also an intrinsic motivation to implement better sleep practices in their daily routine. Based on their proactive involvement, it is assessed that the patient greatly benefited from today's treatment and will likely make efforts to incorporate the insights gained.    P: Continue to attend PHP OT group sessions 5x week for 4 weeks to promote daily structure, social engagement, and opportunities to develop and utilize adaptive strategies to maximize functional performance in preparation for safe transition and integration back into school, work, and the community. Plan to address topic of tbd in next OT group session.                  OT Education - 07/23/24 2206     Education Details Sleep Hygiene           OT Short Term Goals - 07/06/24 1812       OT SHORT TERM GOAL #1   Title By the time of discharge, client will independently set, track, and make progress towards a long-term goal, demonstrating resilience in overcoming obstacles and seeking support when needed.    Time 4    Period Weeks    Status On-going    Target Date 07/25/24      OT SHORT TERM GOAL #2   Title Client will independently identify and modify three areas of the  current routine that contribute to increased stress or dysfunction by the end of therapy.    Status On-going      OT SHORT TERM GOAL #3   Title Client will independently identify and list three personal triggers that lead to heightened stress or negative emotional responses by the end of 3 sessions.    Status On-going                   Plan - 07/23/24 2207     Psychosocial Skills Coping Strategies;Habits;Interpersonal Interaction;Routines and Behaviors          Patient will benefit from skilled therapeutic intervention in order to improve the following deficits and  impairments:       Psychosocial Skills: Coping Strategies, Habits, Interpersonal Interaction, Routines and Behaviors   Visit Diagnosis: Difficulty coping    Problem List Patient Active Problem List   Diagnosis Date Noted   Morbid obesity (HCC) 05/24/2023   Insomnia 05/24/2023   Asthma 05/24/2023   Seasonal allergies 05/24/2023   Anxiety 05/24/2023   Schizophrenia, paranoid (HCC) 04/18/2023   Hypogonadism in male 01/15/2021    Dallas KANDICE Purpura, OT 07/23/2024, 10:07 PM  Dallas Purpura, OT   [facility] [address] [place], [place], [address] Phone: [phone]   Fax:  [phone]  Name: Matthew Padilla MRN: [id number] Date of Birth: December 05, 1995

## 2024-07-23 NOTE — Therapy (Signed)
 [facility] [address] [place], [place], [address] Phone: [phone]   Fax:  [phone]  Occupational Therapy Treatment  Patient Details  Name: Matthew Padilla MRN: [id number] Date of Birth: May 04, 1996 No data recorded  Encounter Date: 07/14/2024   OT End of Session - 07/23/24 2334     Visit Number 9    Number of Visits 20    Date for Recertification  07/25/24    OT Start Time 1200    OT Stop Time 1250    OT Time Calculation (min) 50 min          Past Medical History:  Diagnosis Date   Anxiety    Low testosterone  in male    Paranoid schizophrenia (HCC)    Schizophrenia (HCC)     Past Surgical History:  Procedure Laterality Date   LASIK Bilateral 2018    There were no vitals filed for this visit.   Subjective Assessment - 07/23/24 2334     Currently in Pain? No/denies    Pain Score 0-No pain               Group Session:  S: Doing good. I did sleep well last night.   O: Group session focused on identification and application of grounding strategies to support emotional regulation and reduce cognitive distress. Discussion covered sensory, cognitive, and movement-based techniques for shifting attention from intrusive thoughts or heightened emotional states to present-moment awareness. Patients practiced guided grounding activities and reflected on situations in which these techniques may assist with managing anxiety, rumination, or dissociative symptoms. Group emphasized skill generalization for improved daily functioning and coping.   A: Patient remained present and attentive but participated minimally. Required occasional prompting to contribute and displayed limited verbal interaction. Appeared receptive to material and followed activities with partial engagement.     P: Continue to attend PHP OT group sessions 5x week for 4 weeks to promote daily structure, social engagement, and opportunities to develop and  utilize adaptive strategies to maximize functional performance in preparation for safe transition and integration back into school, work, and the community. Plan to address topic of tbd in next OT group session.                 OT Education - 07/23/24 2334     Education Details Grounding Techniques           OT Short Term Goals - 07/06/24 1812       OT SHORT TERM GOAL #1   Title By the time of discharge, client will independently set, track, and make progress towards a long-term goal, demonstrating resilience in overcoming obstacles and seeking support when needed.    Time 4    Period Weeks    Status On-going    Target Date 07/25/24      OT SHORT TERM GOAL #2   Title Client will independently identify and modify three areas of the current routine that contribute to increased stress or dysfunction by the end of therapy.    Status On-going      OT SHORT TERM GOAL #3   Title Client will independently identify and list three personal triggers that lead to heightened stress or negative emotional responses by the end of 3 sessions.    Status On-going                   Plan - 07/23/24 2335     Psychosocial Skills Coping Strategies;Habits;Interpersonal Interaction;Routines and Behaviors  Patient will benefit from skilled therapeutic intervention in order to improve the following deficits and impairments:       Psychosocial Skills: Coping Strategies, Habits, Interpersonal Interaction, Routines and Behaviors   Visit Diagnosis: Difficulty coping    Problem List Patient Active Problem List   Diagnosis Date Noted   Morbid obesity (HCC) 05/24/2023   Insomnia 05/24/2023   Asthma 05/24/2023   Seasonal allergies 05/24/2023   Anxiety 05/24/2023   Schizophrenia, paranoid (HCC) 04/18/2023   Hypogonadism in male 01/15/2021    Dallas KANDICE Purpura, OT 07/23/2024, 11:36 PM Dallas Purpura, OT  [facility] [address] [place], [place], [address] Phone: [phone]   Fax:  [phone]  Name: Matthew Padilla MRN: [id number] Date of Birth: 1996-05-05

## 2024-07-23 NOTE — Therapy (Signed)
 [facility] [address] [place], [place], [address] Phone: [phone]   Fax:  [phone]  Occupational Therapy Treatment  Patient Details  Name: Matthew Padilla MRN: [id number] Date of Birth: 1996-06-28 No data recorded  Encounter Date: 07/13/2024   OT End of Session - 07/23/24 2230     Visit Number 8    Number of Visits 20    Date for Recertification  07/25/24    OT Start Time 1230    OT Stop Time 1330    OT Time Calculation (min) 60 min          Past Medical History:  Diagnosis Date   Anxiety    Low testosterone  in male    Paranoid schizophrenia (HCC)    Schizophrenia (HCC)     Past Surgical History:  Procedure Laterality Date   LASIK Bilateral 2018    There were no vitals filed for this visit.   Subjective Assessment - 07/23/24 2229     Currently in Pain? No/denies    Pain Score 0-No pain            Group Session:  S: Feeling good today.   O: During the group therapy session, the occupational therapist discussed the impact of sleep disturbances on daily activities and overall health and wellbeing.   The OT also reviewed various types of sleep disorders, including insomnia, sleep apnea, restless leg syndrome, and narcolepsy, and their associated symptoms. Strategies for managing and treating sleep disturbances were also discussed, such as establishing a consistent sleep routine, avoiding stimulants before bedtime, and engaging in relaxation techniques.   Today's group also included information on how sleep disturbances can cause fatigue, mood changes, cognitive impairment, and physical health problems, and emphasizes the importance of seeking prompt treatment to maintain overall health and wellbeing.   A: In today's session, the patient demonstrated active engagement with the topic of The Importance of Sleep. They eagerly asked questions, contributed personal experiences, and showcased a noticeable eagerness to  apply the discussed principles. Their participation indicated not only a strong understanding of the subject matter but also an intrinsic motivation to implement better sleep practices in their daily routine. Based on their proactive involvement, it is assessed that the patient greatly benefited from today's treatment and will likely make efforts to incorporate the insights gained.    P: Continue to attend PHP OT group sessions 5x week for 4 weeks to promote daily structure, social engagement, and opportunities to develop and utilize adaptive strategies to maximize functional performance in preparation for safe transition and integration back into school, work, and the community. Plan to address topic of tbd in next OT group session.                    OT Education - 07/23/24 2230     Education Details Sleep Hygiene           OT Short Term Goals - 07/06/24 1812       OT SHORT TERM GOAL #1   Title By the time of discharge, client will independently set, track, and make progress towards a long-term goal, demonstrating resilience in overcoming obstacles and seeking support when needed.    Time 4    Period Weeks    Status On-going    Target Date 07/25/24      OT SHORT TERM GOAL #2   Title Client will independently identify and modify three areas of the current routine that contribute to increased  stress or dysfunction by the end of therapy.    Status On-going      OT SHORT TERM GOAL #3   Title Client will independently identify and list three personal triggers that lead to heightened stress or negative emotional responses by the end of 3 sessions.    Status On-going                   Plan - 07/23/24 2230     Psychosocial Skills Coping Strategies;Habits;Interpersonal Interaction;Routines and Behaviors          Patient will benefit from skilled therapeutic intervention in order to improve the following deficits and impairments:       Psychosocial  Skills: Coping Strategies, Habits, Interpersonal Interaction, Routines and Behaviors   Visit Diagnosis: Difficulty coping    Problem List Patient Active Problem List   Diagnosis Date Noted   Morbid obesity (HCC) 05/24/2023   Insomnia 05/24/2023   Asthma 05/24/2023   Seasonal allergies 05/24/2023   Anxiety 05/24/2023   Schizophrenia, paranoid (HCC) 04/18/2023   Hypogonadism in male 01/15/2021    Dallas KANDICE Purpura, OT 07/23/2024, 10:30 PM  Dallas Purpura, OT    [facility] [address] [place], [place], [address] Phone: [phone]   Fax:  [phone]  Name: Matthew Padilla MRN: [id number] Date of Birth: 11-01-1995

## 2024-07-24 ENCOUNTER — Ambulatory Visit (HOSPITAL_COMMUNITY)

## 2024-07-25 ENCOUNTER — Ambulatory Visit (HOSPITAL_COMMUNITY)

## 2024-07-26 ENCOUNTER — Ambulatory Visit (HOSPITAL_COMMUNITY)

## 2024-07-27 ENCOUNTER — Ambulatory Visit (HOSPITAL_COMMUNITY)

## 2024-07-28 ENCOUNTER — Ambulatory Visit (HOSPITAL_COMMUNITY)

## 2024-07-29 ENCOUNTER — Other Ambulatory Visit (HOSPITAL_COMMUNITY): Payer: Self-pay | Admitting: Psychiatry

## 2024-07-29 DIAGNOSIS — R251 Tremor, unspecified: Secondary | ICD-10-CM

## 2024-07-29 DIAGNOSIS — F4010 Social phobia, unspecified: Secondary | ICD-10-CM

## 2024-07-29 DIAGNOSIS — F200 Paranoid schizophrenia: Secondary | ICD-10-CM

## 2024-07-29 DIAGNOSIS — F4310 Post-traumatic stress disorder, unspecified: Secondary | ICD-10-CM

## 2024-07-30 ENCOUNTER — Encounter (HOSPITAL_COMMUNITY): Payer: Self-pay

## 2024-07-30 NOTE — Therapy (Signed)
 [facility] [address] [place], [place], [address] Phone: [phone]   Fax:  [phone]  Occupational Therapy Treatment  Patient Details  Name: Matthew Padilla MRN: [id number] Date of Birth: 1996/09/09 No data recorded  Encounter Date: 07/18/2024   OT End of Session - 07/30/24 1304     Visit Number 10    Number of Visits 20    Date for Recertification  07/25/24    OT Start Time 1230    OT Stop Time 1330    OT Time Calculation (min) 60 min    Activity Tolerance Patient tolerated treatment well          Past Medical History:  Diagnosis Date   Anxiety    Low testosterone  in male    Paranoid schizophrenia (HCC)    Schizophrenia (HCC)     Past Surgical History:  Procedure Laterality Date   LASIK Bilateral 2018    There were no vitals filed for this visit.   Subjective Assessment - 07/30/24 1304     Currently in Pain? No/denies    Pain Score 0-No pain              Group Session:  S: Doing fine today.   O: During today's OT group session, the patient participated in an educational segment about the importance of goal-setting and the application of the SMART framework to enhance daily life, particularly focusing on ADLs and iADLs. The session began with five open-ended pre-session questions that facilitated group discussion and introspection about their current relationship with goals. Following the introduction and educational segment, participants engaged in brainstorming and group discussions to devise hypothetical SMART goals. The session concluded with five post-session questions to reinforce understanding and facilitate reflection. Throughout the session, there was a range of engagement levels noted among the participants.   A:  Patient demonstrated a high level of engagement throughout the session. They actively participated in discussions, sharing personal experiences related to goal setting and challenges faced.  Patient was able to clearly articulate an understanding of the SMART framework and proposed personal SMART goals related to their own ADLs with minimal assistance. They expressed enthusiasm about applying what they learned to their daily routine and appeared motivated to make changes.   P: Continue to attend PHP OT group sessions 5x week for 4 weeks to promote daily structure, social engagement, and opportunities to develop and utilize adaptive strategies to maximize functional performance in preparation for safe transition and integration back into school, work, and the community. Plan to address topic of tbd in next OT group session.                  OT Education - 07/30/24 1304     Education Details SMART Goals           OT Short Term Goals - 07/06/24 1812       OT SHORT TERM GOAL #1   Title By the time of discharge, client will independently set, track, and make progress towards a long-term goal, demonstrating resilience in overcoming obstacles and seeking support when needed.    Time 4    Period Weeks    Status On-going    Target Date 07/25/24      OT SHORT TERM GOAL #2   Title Client will independently identify and modify three areas of the current routine that contribute to increased stress or dysfunction by the end of therapy.    Status On-going  OT SHORT TERM GOAL #3   Title Client will independently identify and list three personal triggers that lead to heightened stress or negative emotional responses by the end of 3 sessions.    Status On-going                   Plan - 07/30/24 1306     Psychosocial Skills Coping Strategies;Habits;Interpersonal Interaction;Routines and Behaviors          Patient will benefit from skilled therapeutic intervention in order to improve the following deficits and impairments:       Psychosocial Skills: Coping Strategies, Habits, Interpersonal Interaction, Routines and Behaviors   Visit  Diagnosis: Difficulty coping    Problem List Patient Active Problem List   Diagnosis Date Noted   Morbid obesity (HCC) 05/24/2023   Insomnia 05/24/2023   Asthma 05/24/2023   Seasonal allergies 05/24/2023   Anxiety 05/24/2023   Schizophrenia, paranoid (HCC) 04/18/2023   Hypogonadism in male 01/15/2021    D[place] KANDICE Purpura, OT 07/30/2024, 1:07 PM  Dallas Purpura, OT   [facility] [address] [place], [place], [address] Phone: [phone]   Fax:  [phone]  Name: Matthew Padilla MRN: [id number] Date of Birth: 12/14/1995

## 2024-07-30 NOTE — Therapy (Signed)
 [facility] [address] [place], [place], [address] Phone: [phone]   Fax:  [phone]  Occupational Therapy Treatment  Patient Details  Name: Matthew Padilla MRN: [id number] Date of Birth: [birth year] No data recorded  Encounter Date: 07/19/2024   OT End of Session - 07/30/24 1322     Visit Number 11    Number of Visits 20    Date for Recertification  07/25/24    OT Start Time 1230    OT Stop Time 1330    OT Time Calculation (min) 60 min          Past Medical History:  Diagnosis Date   Anxiety    Low testosterone  in male    Paranoid schizophrenia (HCC)    Schizophrenia (HCC)     Past Surgical History:  Procedure Laterality Date   LASIK Bilateral 2018    There were no vitals filed for this visit.   Subjective Assessment - 07/30/24 1322     Currently in Pain? No/denies    Pain Score 0-No pain             Group Session:  S: doing better today. Sleep good.   O: During today's OT group session, the patient participated in an educational segment about the importance of goal-setting and the application of the SMART framework to enhance daily life, particularly focusing on ADLs and iADLs. The session began with five open-ended pre-session questions that facilitated group discussion and introspection about their current relationship with goals. Following the introduction and educational segment, participants engaged in brainstorming and group discussions to devise hypothetical SMART goals. The session concluded with five post-session questions to reinforce understanding and facilitate reflection. Throughout the session, there was a range of engagement levels noted among the participants.   A:  Patient demonstrated a high level of engagement throughout the session. They actively participated in discussions, sharing personal experiences related to goal setting and challenges faced. Patient was able to clearly articulate an  understanding of the SMART framework and proposed personal SMART goals related to their own ADLs with minimal assistance. They expressed enthusiasm about applying what they learned to their daily routine and appeared motivated to make changes.   P: Continue to attend PHP OT group sessions 5x week for 4 weeks to promote daily structure, social engagement, and opportunities to develop and utilize adaptive strategies to maximize functional performance in preparation for safe transition and integration back into school, work, and the community. Plan to address topic of tbd in next OT group session.                   OT Education - 07/30/24 1322     Education Details SMART Goals           OT Short Term Goals - 07/06/24 1812       OT SHORT TERM GOAL #1   Title By the time of discharge, client will independently set, track, and make progress towards a long-term goal, demonstrating resilience in overcoming obstacles and seeking support when needed.    Time 4    Period Weeks    Status On-going    Target Date 07/25/24      OT SHORT TERM GOAL #2   Title Client will independently identify and modify three areas of the current routine that contribute to increased stress or dysfunction by the end of therapy.    Status On-going      OT SHORT TERM GOAL #3  Title Client will independently identify and list three personal triggers that lead to heightened stress or negative emotional responses by the end of 3 sessions.    Status On-going                   Plan - 07/30/24 1322     Psychosocial Skills Coping Strategies;Habits;Interpersonal Interaction;Routines and Behaviors          Patient will benefit from skilled therapeutic intervention in order to improve the following deficits and impairments:       Psychosocial Skills: Coping Strategies, Habits, Interpersonal Interaction, Routines and Behaviors   Visit Diagnosis: Difficulty coping    Problem  List Patient Active Problem List   Diagnosis Date Noted   Morbid obesity (HCC) 05/24/2023   Insomnia 05/24/2023   Asthma 05/24/2023   Seasonal allergies 05/24/2023   Anxiety 05/24/2023   Schizophrenia, paranoid (HCC) 04/18/2023   Hypogonadism in male 01/15/2021    Dallas KANDICE Purpura, OT 07/30/2024, 1:22 PM  Dallas Purpura, OT   [facility] [address] [place], [place], [address] Phone: [phone]   Fax:  [phone]  Name: Imer Foxworth MRN: [id number] Date of Birth: [birth year]

## 2024-08-02 NOTE — Psych (Signed)
 Matthew Padilla [facility] PHP THERAPIST PROGRESS NOTE  Matthew Padilla [id number]   Session Time: 9:00 am - 10:00 am  Participation Level: Minimal  Behavioral Response: Casual and GuardedAlertAnxious, Depressed, and Flat  Type of Therapy: Group Therapy  Treatment Goals addressed: Coping  Progress Towards Goals: Initial  Interventions: CBT, DBT, Solution Focused, Strength-based, Supportive, and Reframing  Therapist Response: Clinician led check-in regarding current stressors and situation, and review of patient completed daily inventory. Clinician utilized active listening and empathetic response and validated patient emotions. Clinician facilitated processing group on pertinent issues.?   Summary: Patient arrived within time allowed. Patient rates their depression at a 7 and anxiety at a 7 on a scale of 1-10 with 10 being high. Pt reports sleeping 8 hours and eating 3 times yesterday. Pt states yesterday was pretty good and he rested, chatted with his sister, and had ice cream. Pt reports he continues to have AH and it is just something that happens and denies it is command or upsetting to him. Pt denies experiencing SI/SH thoughts and feelings of hopelessness.        Session Time: 10:00 am - 11:00 am  Participation Level: Minimal  Behavioral Response: Casual and GuardedAlertAnxious and Depressed  Type of Therapy: Group Therapy  Treatment Goals addressed: Coping  Progress Towards Goals: Initial  Interventions: CBT, DBT, Solution Focused, Strength-based, Supportive, and Reframing  Therapist Response: Cln led discussion on communicating our struggles with our support team. Cln introduced the concept of giving disclosures to the people close to us  regarding the way we act in specific situations or the way we process certain stimuli. Group members discussed ways to provide this road map to our brain and in what situations this may be helpful to them.  Summary: Pt minimally engaged in  discussion and identified his mom as someone he could give a disclosure to.       Session Time: 11:00 am - 12:00 pm  Participation Level: Minimal  Behavioral Response: CasualAlertAnxious  Type of Therapy: Group Therapy  Treatment Goals addressed: Coping  Progress Towards Goals: Initial  Interventions: CBT, DBT, Solution Focused, Strength-based, Supportive, and Reframing  Therapist Response: Cln continued topic of boundaries. Cln discussed the different ways boundaries present: physical, emotional, intellectual, sexual, material, and time. Group talked about the ways in which each type presents for them and is a struggle.   Summary:  Pt minimally engaged in discussion and identified 1 struggle with prompting.          Session Time: 12:00 pm - 12:30 pm  Participation Level: Minimal  Behavioral Response: CasualAlertAnxious  Type of Therapy: Group Therapy  Treatment Goals addressed: Coping  Progress Towards Goals: Initial  Interventions: Psychologist, Occupational, Supportive  Therapist Response: Reflection Group: Patients encouraged to practice skills and interpersonal techniques or work on mindfulness and relaxation techniques. The importance of self-care and making skills part of a routine to increase usage were stressed.  Summary: Patient engaged and participated appropriately.      Session Time: 12:30 pm - 1:30 pm  Participation Level: Minimal  Behavioral Response: CasualAlertAnxious  Type of Therapy: Group Therapy  Treatment Goals addressed: Coping  Progress Towards Goals: Initial  Interventions: OT group  Therapist Response: Group was led by occupational therapist, Edward Hollan.   Summary: Pt engaged and participated in discussion.      Session Time: 1:30 pm - 2:00 pm  Participation Level: Minimal  Behavioral Response: CasualAlertAnxious  Type of Therapy: Group Therapy  Treatment Goals addressed: Coping  Progress  Towards Goals:  Initial  Interventions: CBT, DBT, Solution Focused, Strength-based, Supportive, and Reframing  Therapist Response: 1:30 pm - 1:50 pm: Cln led group focused on deep breathing. Group practiced diaphragmatic breathing through different methods and discussed the ways to utilize the different methods.  1:50 - 2:00 pm: Clinician led check-out. Clinician assessed for immediate needs, medication compliance and efficacy, and safety concerns?  Summary: 1:30 pm - 1:50 pm: Pt participated.  1:50 - 2:00 pm: At check-out, patient reports no immediate concerns. Patient demonstrates progress as evidenced by continued engagement and responsiveness to treatment. Patient denies SI/HI/self-harm thoughts at the end of group.    Suicidal/Homicidal: Nowithout intent/plan  Plan: ?Pt will continue in PHP and medication management while continuing to work on decreasing depression symptoms,?SI, and anxiety symptoms,?and increasing the ability to self manage symptoms.   Collaboration of Care: Medication Management AEB Staci Kerns NP  Patient/Guardian was advised Release of Information must be obtained prior to any record release in order to collaborate their care with an outside provider. Patient/Guardian was advised if they have not already done so to contact the registration department to sign all necessary forms in order for us  to release information regarding their care.   Consent: Patient/Guardian gives verbal consent for treatment and assignment of benefits for services provided during this visit. Patient/Guardian expressed understanding and agreed to proceed.   Diagnosis: Schizophrenia, paranoid (HCC) [F20.0]    1. Schizophrenia, paranoid (HCC)       Matthew Bastos, LCSW

## 2024-08-02 NOTE — Psych (Signed)
 [facility] PHP THERAPIST PROGRESS NOTE  Matthew Padilla [id number]   Session Time: 9:00 am - 10:00 am  Participation Level: Minimal  Behavioral Response: Casual and GuardedAlertAnxious, Depressed, and Flat  Type of Therapy: Group Therapy  Treatment Goals addressed: Coping  Progress Towards Goals: Initial  Interventions: CBT, DBT, Solution Focused, Strength-based, Supportive, and Reframing  Therapist Response: Clinician led check-in regarding current stressors and situation, and review of patient completed daily inventory. Clinician utilized active listening and empathetic response and validated patient emotions. Clinician facilitated processing group on pertinent issues.?   Summary: Patient arrived within time allowed. Patient rates their depression at a 7 and anxiety at a 5 on a scale of 1-10 with 10 being best. Pt reports sleeping 8 hours and eating 3 times yesterday. Pt states yesterday was pretty good and he rested, watched an episode of tv, and went to pickup dinner with his mom and sister. Pt reports he conversed with them however, mostly listened. Pt continues to only verbally participate when directly asked however has moments of facial engagement. Pt denies experiencing SI/SH thoughts and feelings of hopelessness.        Session Time: 10:00 am - 11:00 am  Participation Level: Minimal  Behavioral Response: Casual and GuardedAlertAnxious and Depressed  Type of Therapy: Group Therapy  Treatment Goals addressed: Coping  Progress Towards Goals: Initial  Interventions: CBT, DBT, Solution Focused, Strength-based, Supportive, and Reframing  Therapist Response: Cln led processing group for pt's current struggles. Group members shared stressors and provided support and feedback. Cln brought in topics of boundaries, healthy relationships, and unhealthy thought processes to inform discussion.   Summary: Pt provided support to group through nods and fleeting eye contact.        Session Time: 11:00 am - 12:00 pm  Participation Level: Minimal  Behavioral Response: CasualAlertAnxious  Type of Therapy: Group Therapy  Treatment Goals addressed: Coping  Progress Towards Goals: Initial  Interventions: CBT, DBT, Solution Focused, Strength-based, Supportive, and Reframing  Therapist Response: Cln introduced topic of boundaries. Cln discussed how boundaries inform our relationships and affect self-esteem and personal agency. Group discussed the three types of boundaries: rigid, porous, and healthy and when each type is most helpful/harmful.   Summary: Pt minimally engaged in discussion and is able to identify each type in their life.        Session Time: 12:00 pm - 12:30 pm  Participation Level: Minimal  Behavioral Response: CasualAlertAnxious  Type of Therapy: Group Therapy  Treatment Goals addressed: Coping  Progress Towards Goals: Initial  Interventions: Psychologist, Occupational, Supportive  Therapist Response: Reflection Group: Patients encouraged to practice skills and interpersonal techniques or work on mindfulness and relaxation techniques. The importance of self-care and making skills part of a routine to increase usage were stressed.  Summary: Patient engaged and participated appropriately.      Session Time: 12:30 pm - 1:30 pm  Participation Level: Minimal  Behavioral Response: CasualAlertAnxious  Type of Therapy: Group Therapy  Treatment Goals addressed: Coping  Progress Towards Goals: Initial  Interventions: OT group  Therapist Response: Group was led by occupational therapist, Edward Hollan.   Summary: Pt engaged and participated in discussion.      Session Time: 1:30 pm - 2:00 pm  Participation Level: Minimal  Behavioral Response: CasualAlertAnxious  Type of Therapy: Group Therapy  Treatment Goals addressed: Coping  Progress Towards Goals: Initial  Interventions: CBT, DBT, Solution Focused,  Strength-based, Supportive, and Reframing  Therapist Response: 1:30 pm - 1:50 pm: Cln led  group focused on DBT mindfulness skills. Cln led activity utilizing meditation labyrinths. Cln coached pt's on utilizing the how/what mindfulness skills throughout activity.  1:50 - 2:00 pm: Clinician led check-out. Clinician assessed for immediate needs, medication compliance and efficacy, and safety concerns?  Summary: 1:30 pm - 1:50 pm: Pt participated and engaged in discussion.  1:50 - 2:00 pm: At check-out, patient reports no immediate concerns. Patient demonstrates progress as evidenced by continued engagement and responsiveness to treatment. Patient denies SI/HI/self-harm thoughts at the end of group.    Suicidal/Homicidal: Nowithout intent/plan  Plan: ?Pt will continue in PHP and medication management while continuing to work on decreasing depression symptoms,?SI, and anxiety symptoms,?and increasing the ability to self manage symptoms.   Collaboration of Care: Medication Management AEB Staci Kerns NP  Patient/Guardian was advised Release of Information must be obtained prior to any record release in order to collaborate their care with an outside provider. Patient/Guardian was advised if they have not already done so to contact the registration department to sign all necessary forms in order for us  to release information regarding their care.   Consent: Patient/Guardian gives verbal consent for treatment and assignment of benefits for services provided during this visit. Patient/Guardian expressed understanding and agreed to proceed.   Diagnosis: Schizophrenia, paranoid (HCC) [F20.0]    1. Schizophrenia, paranoid (HCC)       Randall Bastos, LCSW

## 2024-08-02 NOTE — Psych (Signed)
 [facility] PHP THERAPIST PROGRESS NOTE  Matthew Padilla [id number]   Session Time: 9:00 am - 10:00 am  Participation Level: Minimal  Behavioral Response: Casual and GuardedAlertAnxious, Depressed, and Flat  Type of Therapy: Group Therapy  Treatment Goals addressed: Coping  Progress Towards Goals: Initial  Interventions: CBT, DBT, Solution Focused, Strength-based, Supportive, and Reframing  Therapist Response: Clinician led check-in regarding current stressors and situation, and review of patient completed daily inventory. Clinician utilized active listening and empathetic response and validated patient emotions. Clinician facilitated processing group on pertinent issues.?   Summary: Patient arrived within time allowed. Patient rates their depression at a 7 and anxiety at a 7 on a scale of 1-10 with 10 being best. Pt reports sleeping 8 hours and eating 3 times yesterday. Pt states he rested after group and watched tv with his sister. Pt shares struggling with anxiety throughout his days but feels mostly okay at home. Pt denies experiencing SI/SH thoughts and feelings of hopelessness.        Session Time: 10:00 am - 11:00 am  Participation Level: Minimal  Behavioral Response: Casual and GuardedAlertAnxious, Depressed, and flat  Type of Therapy: Group Therapy  Treatment Goals addressed: Coping  Progress Towards Goals: Initial  Interventions: CBT, DBT, Solution Focused, Strength-based, Supportive, and Reframing  Therapist Response: Cln led discussion on reverting to old behaviors. Group members discussed ways in which they feel they have reverted currently or in the past. Group members report fear of reverting to old behaviors and they struggle to manage that fear. Cln informed discussion with CBT thought challenging and DBT distress tolerance skills.   Summary: Pt minimally engaged in discussion.       Session Time: 11:00 am - 12:00 pm  Participation Level:  Minimally  Behavioral Response: CasualAlertAnxiousFlat  Type of Therapy: Group Therapy  Treatment Goals addressed: Coping  Progress Towards Goals: Initial  Interventions: CBT, DBT, Solution Focused, Strength-based, Supportive, and Reframing  Therapist Response: Cln continued topic of boundaries and introduced how to set and maintain healthy boundaries. Cln utilized handout how to set boundaries and group members worked through examples to practice setting appropriate boundaries.   Summary: Pt minimally engaged in discussion     Session Time: 12:30 pm - 1:30 pm  Participation Level: Minimal  Behavioral Response: CasualAlertAnxiousFlat  Type of Therapy: Group Therapy  Treatment Goals addressed: Coping  Progress Towards Goals: Initial  Interventions: OT group  Therapist Response: 12:00 - 12:50 Group was led by occupational therapist, Dallas Purpura.  12:50 - 1:00 pm: Clinician led check-out. Clinician assessed for immediate needs, medication compliance and efficacy, and safety concerns?  Summary: 12:00 pm - 12:50 pm: Pt minimally engaged in discussion.  12:50 - 1:00 pm: At check-out, patient reports no immediate concerns. Patient demonstrates progress as evidenced by continued engagement and responsiveness to treatment. Patient denies SI/HI/self-harm thoughts at the end of group.      Suicidal/Homicidal: Nowithout intent/plan  Plan: ?Pt will continue in PHP and medication management while continuing to work on decreasing depression symptoms,?SI, and anxiety symptoms,?and increasing the ability to self manage symptoms.   Collaboration of Care: Medication Management AEB Staci Kerns NP  Patient/Guardian was advised Release of Information must be obtained prior to any record release in order to collaborate their care with an outside provider. Patient/Guardian was advised if they have not already done so to contact the registration department to sign all necessary forms in  order for us  to release information regarding their care.   Consent: Patient/Guardian  gives verbal consent for treatment and assignment of benefits for services provided during this visit. Patient/Guardian expressed understanding and agreed to proceed.   Diagnosis: Schizophrenia, paranoid (HCC) [F20.0]    1. Schizophrenia, paranoid (HCC)       Randall Bastos, LCSW

## 2024-08-03 ENCOUNTER — Encounter: Payer: Self-pay | Admitting: Family Medicine

## 2024-08-03 NOTE — Telephone Encounter (Signed)
 Copied from CRM #[id number]. Topic: Clinical - Medication Refill >> Aug 03, 2024 10:04 AM Harlene ORN wrote: Medication: metFORMIN  (GLUCOPHAGE -XR) 500 MG 24 hr tablet  Has the patient contacted their pharmacy? No (Agent: If no, request that the patient contact the pharmacy for the refill. If patient does not wish to contact the pharmacy document the reason why and proceed with request.) (Agent: If yes, when and what did the pharmacy advise?)  This is the patient's preferred pharmacy:  [id number] [address],  - [address] [place] [address] Phone: [phone] Fax: [phone]  Is this the correct pharmacy for this prescription? Yes If no, delete pharmacy and type the correct one.   Has the prescription been filled recently? No  Is the patient out of the medication? Yes  Has the patient been seen for an appointment in the last year OR does the patient have an upcoming appointment? Yes  Can we respond through MyChart? Yes  Agent: Please be advised that Rx refills may take up to 3 business days. We ask that you follow-up with your pharmacy.

## 2024-08-04 NOTE — Psych (Signed)
 [name]   Session Time: 9:00 am - 10:00 am  Participation Level: Minimal  Behavioral Response: Casual and GuardedAlertAnxious, Depressed, and Flat  Type of Therapy: Group Therapy  Treatment Goals addressed: Coping  Progress Towards Goals: Progressing  Interventions: CBT, DBT, Solution Focused, Strength-based, Supportive, and Reframing  Therapist Response: Clinician led check-in regarding current stressors and situation, and review of patient completed daily inventory. Clinician utilized active listening and empathetic response and validated patient emotions. Clinician facilitated processing group on pertinent issues.?   Summary: Patient arrived within time allowed. Patient rates their depression at a 7 and anxiety at a 7 on a scale of 1-10 with 10 being high. Pt reports sleeping 8 hours and eating 3 times yesterday. Pt states yesterday was okay. Pt states he completed day 4 of walking. Pt states he was tired yesterday and rested and went to bed early. Pt denies experiencing SI/SH thoughts and feelings of hopelessness.        Session Time: 10:00 am - 11:00 am  Participation Level: Minimal  Behavioral Response: Casual and GuardedAlertAnxious and Depressed  Type of Therapy: Group Therapy  Treatment Goals addressed: Coping  Progress Towards Goals: Progressing  Interventions: CBT, DBT, Solution Focused, Strength-based, Supportive, and Reframing  Therapist Response: Cln led discussion on the power and control cycle, abuse, and the way abuse affects us . Group members shared struggles they have experienced with abusive or unhealthy dynamics in relationships and how it impacted them. Group member provided support to one another. Cln brought in the cycle of abuse, support, and thought challenging to inform discussion.   Summary: Pt minimally engaged in discussion.      Session Time: 11:00 am - 12:00 pm  Participation  Level: Minimal  Behavioral Response: CasualAlertAnxious  Type of Therapy: Group Therapy  Treatment Goals addressed: Coping  Progress Towards Goals: Progressing  Interventions: CBT, DBT, Solution Focused, Strength-based, Supportive, and Reframing  Therapist Response: Cln introduced DBT distress tolerance skill IMPROVE.  Cln discussed how this set of skills are for when you have to sit through an undesirable feeling and wait for it to pass. Group discussed how to apply the IMPROVE skills to decrease distress at the undesired feeling.   Summary:  Pt minimally engaged in discussion and reports understanding of skill.        Session Time: 12:00 pm - 12:30 pm  Participation Level: Active  Behavioral Response: CasualAlertAnxious  Type of Therapy: Group Therapy  Treatment Goals addressed: Coping  Progress Towards Goals: Progressing  Interventions: Psychologist, Occupational, Supportive  Therapist Response: Reflection Group: Patients encouraged to practice skills and interpersonal techniques or work on mindfulness and relaxation techniques. The importance of self-care and making skills part of a routine to increase usage were stressed.  Summary: Patient engaged and participated appropriately.      Session Time: 12:30 pm - 1:30 pm  Participation Level: Minimal  Behavioral Response: CasualAlertAnxious  Type of Therapy: Group Therapy  Treatment Goals addressed: Coping  Progress Towards Goals: Progressing  Interventions: OT group  Therapist Response: Group was led by occupational therapist, Edward Hollan.   Summary: Pt engaged and participated in discussion.      Session Time: 1:30 pm - 2:00 pm  Participation Level: Minimal  Behavioral Response: CasualAlertAnxious  Type of Therapy: Group Therapy  Treatment Goals addressed: Coping  Progress Towards Goals: Progressing  Interventions: CBT, DBT, Solution Focused, Strength-based, Supportive, and  Reframing  Therapist Response: 1:30 pm - 1:50 pm:  Cln led group through a guided meditation practice to work on DBT mindfulness skills.  1:50 - 2:00 pm: Clinician led check-out. Clinician assessed for immediate needs, medication compliance and efficacy, and safety concerns?  Summary: 1:30 pm - 1:50 pm: Pt participated.  1:50 - 2:00 pm: At check-out, patient reports no immediate concerns. Patient demonstrates progress as evidenced by continued engagement and responsiveness to treatment. Patient denies SI/HI/self-harm thoughts at the end of group.    Suicidal/Homicidal: Nowithout intent/plan  Plan: ?Pt will continue in PHP and medication management while continuing to work on decreasing depression symptoms,?SI, and anxiety symptoms,?and increasing the ability to self manage symptoms.   Collaboration of Care: Medication Management AEB Staci Kerns NP  Patient/Guardian was advised Release of Information must be obtained prior to any record release in order to collaborate their care with an outside provider. Patient/Guardian was advised if they have not already done so to contact the registration department to sign all necessary forms in order for us  to release information regarding their care.   Consent: Patient/Guardian gives verbal consent for treatment and assignment of benefits for services provided during this visit. Patient/Guardian expressed understanding and agreed to proceed.   Diagnosis: Schizophrenia, paranoid (HCC) [F20.0]    1. Schizophrenia, paranoid (HCC)       Randall Bastos, LCSW

## 2024-08-04 NOTE — Psych (Signed)
 [facility] BH PHP THERAPIST PROGRESS NOTE  Matthew Padilla [id number]   Session Time: 9:00 am - 10:00 am  Participation Level: Minimal  Behavioral Response: Casual and GuardedAlertAnxious, Depressed, and Flat  Type of Therapy: Group Therapy  Treatment Goals addressed: Coping  Progress Towards Goals: Progressing  Interventions: CBT, DBT, Solution Focused, Strength-based, Supportive, and Reframing  Therapist Response: Clinician led check-in regarding current stressors and situation, and review of patient completed daily inventory. Clinician utilized active listening and empathetic response and validated patient emotions. Clinician facilitated processing group on pertinent issues.?   Summary: Patient arrived within time allowed. Patient rates their depression at a 7 and anxiety at a 7 on a scale of 1-10 with 10 being high. Pt reports sleeping 8 hours and eating 3 times yesterday. Pt states yesterday went pretty well. Pt states he relaxed on the couch and watched tv or spent time on his phone. Pt reports increased ease in being able to distract with tasks versus staring at walls. Pt states he skipped his walk yesterday, however continues to keep up walking most days. Pt denies experiencing SI/SH thoughts and feelings of hopelessness.        Session Time: 10:00 am - 11:00 am  Participation Level: Minimal  Behavioral Response: Casual and GuardedAlertAnxious and Depressed  Type of Therapy: Group Therapy  Treatment Goals addressed: Coping  Progress Towards Goals: Progressing  Interventions: CBT, DBT, Solution Focused, Strength-based, Supportive, and Reframing  Therapist Response: Cln led processing group for pt's current struggles. Group members shared stressors and provided support and feedback. Cln brought in topics of boundaries, healthy relationships, and unhealthy thought processes to inform discussion.   Summary: Pt minimally engaged.       Session Time: 11:00 am - 12:00  pm  Participation Level: Minimal  Behavioral Response: CasualAlertAnxious  Type of Therapy: Group Therapy  Treatment Goals addressed: Coping  Progress Towards Goals: Progressing  Interventions: CBT, DBT, Solution Focused, Strength-based, Supportive, and Reframing  Therapist Response: Cln led discussion on extending grace and kindness to ourselves. Group discussed the messages they give themselves and how it impacts them. Cln discussed the best friend test as a way to calibrate whether we are being fair to ourselves or not and encouraged pt's to consider, would I believe this/say this about someone I cared about?    Summary:  Pt minimally engaged in discussion and identifies that treating themselves with kindness is difficult.       Session Time: 12:00 pm - 12:30 pm  Participation Level: Active  Behavioral Response: CasualAlertAnxious  Type of Therapy: Group Therapy  Treatment Goals addressed: Coping  Progress Towards Goals: Progressing  Interventions: Psychologist, Occupational, Supportive  Therapist Response: Reflection Group: Patients encouraged to practice skills and interpersonal techniques or work on mindfulness and relaxation techniques. The importance of self-care and making skills part of a routine to increase usage were stressed.  Summary: Patient engaged and participated appropriately.      Session Time: 12:30 pm - 1:30 pm  Participation Level: Active  Behavioral Response: CasualAlertAnxious  Type of Therapy: Group Therapy  Treatment Goals addressed: Coping  Progress Towards Goals: Progressing  Interventions: OT group  Therapist Response: Group was led by occupational therapist, Edward Hollan.   Summary: Pt engaged and participated in discussion.      Session Time: 1:30 pm - 2:00 pm  Participation Level: Minimal  Behavioral Response: CasualAlertAnxious  Type of Therapy: Group Therapy  Treatment Goals addressed: Coping  Progress  Towards Goals: Progressing  Interventions: CBT,  DBT, Solution Focused, Strength-based, Supportive, and Reframing  Therapist Response: 1:30 pm - 1:50 pm: Cln introduced Progressive Muscle Relaxation from the DBT TIPP skills and led group through practice of the skill.  1:50 - 2:00 pm: Clinician led check-out. Clinician assessed for immediate needs, medication compliance and efficacy, and safety concerns?  Summary: 1:30 pm - 1:50 pm: Pt participated. 1:50 - 2:00 pm: At check-out, patient reports no immediate concerns. Patient demonstrates progress as evidenced by continued engagement and responsiveness to treatment. Patient denies SI/HI/self-harm thoughts at the end of group.    Suicidal/Homicidal: Nowithout intent/plan  Plan: ?Pt will discharge from PHP due to meeting treatment goals of decreased depression symptoms,?SI, and anxiety symptoms,?and increased the ability to self manage symptoms. Pt has declined IOP and will return to previous outpatient providrs for ongoing care. Pt and provider are aligned with discharge plan. Pt denies SI/HI at time of discharge.   Collaboration of Care: Medication Management AEB Staci Kerns NP  Patient/Guardian was advised Release of Information must be obtained prior to any record release in order to collaborate their care with an outside provider. Patient/Guardian was advised if they have not already done so to contact the registration department to sign all necessary forms in order for us  to release information regarding their care.   Consent: Patient/Guardian gives verbal consent for treatment and assignment of benefits for services provided during this visit. Patient/Guardian expressed understanding and agreed to proceed.   Diagnosis: Schizophrenia, paranoid (HCC) [F20.0]    1. Schizophrenia, paranoid (HCC)       Randall Bastos, LCSW

## 2024-08-04 NOTE — Psych (Signed)
 [facility] BH PHP THERAPIST PROGRESS NOTE  Matthew Padilla [id number]   Session Time: 9:00 am - 10:00 am  Participation Level: Minimal  Behavioral Response: Casual and GuardedAlertAnxious, Depressed, and Flat  Type of Therapy: Group Therapy  Treatment Goals addressed: Coping  Progress Towards Goals: Initial  Interventions: CBT, DBT, Solution Focused, Strength-based, Supportive, and Reframing  Therapist Response: Clinician led check-in regarding current stressors and situation, and review of patient completed daily inventory. Clinician utilized active listening and empathetic response and validated patient emotions. Clinician facilitated processing group on pertinent issues.?   Summary: Patient arrived within time allowed. Patient rates their depression at a 7 and anxiety at a 7 on a scale of 1-10 with 10 being high. Pt reports sleeping 8 hours and eating 3 times yesterday. Pt states yesterday was okay. Pt states he is on day 3 of walking with his dad to follow his positive psychology task. Pt states he napped and spent time on social media. Pt denies experiencing SI/SH thoughts and feelings of hopelessness.        Session Time: 10:00 am - 11:00 am  Participation Level: Minimal  Behavioral Response: Casual and GuardedAlertAnxious and Depressed  Type of Therapy: Group Therapy  Treatment Goals addressed: Coping  Progress Towards Goals: Initial  Interventions: CBT, DBT, Solution Focused, Strength-based, Supportive, and Reframing  Therapist Response: Cln led discussion on safety and the ways in which we can seek and own it in ourselves. Group members shared how they view safety and situations which hinder safety. Cln encouraged pt's to think about emotional safety and ways to foster it.   Summary: Pt minimally engaged in discussion.      Session Time: 11:00 am - 12:00 pm  Participation Level: Minimal  Behavioral Response: CasualAlertAnxious  Type of Therapy: Group  Therapy  Treatment Goals addressed: Coping  Progress Towards Goals: Initial  Interventions: CBT, DBT, Solution Focused, Strength-based, Supportive, and Reframing  Therapist Response: Cln continued discussion on topic of boundaries. Group reviewed previous aspects of boundaries discussed. Cln utilized handout Tips for [employer] and group members discussed how to apply the tips. Cln shaped conversation and cued for healthy boundary characteristics.   Summary:  Pt minimally engaged in discussion      Session Time: 12:00 pm - 12:30 pm  Participation Level: Minimal  Behavioral Response: CasualAlertAnxious  Type of Therapy: Group Therapy  Treatment Goals addressed: Coping  Progress Towards Goals: Initial  Interventions: Psychologist, Occupational, Supportive  Therapist Response: Reflection Group: Patients encouraged to practice skills and interpersonal techniques or work on mindfulness and relaxation techniques. The importance of self-care and making skills part of a routine to increase usage were stressed.  Summary: Patient engaged and participated appropriately.      Session Time: 12:30 pm - 1:30 pm  Participation Level: Minimal  Behavioral Response: CasualAlertAnxious  Type of Therapy: Group Therapy  Treatment Goals addressed: Coping  Progress Towards Goals: Initial  Interventions: OT group  Therapist Response: Group was led by occupational therapist, Edward Hollan.   Summary: Pt engaged and participated in discussion.      Session Time: 1:30 pm - 2:00 pm  Participation Level: Minimal  Behavioral Response: CasualAlertAnxious  Type of Therapy: Group Therapy  Treatment Goals addressed: Coping  Progress Towards Goals: Initial  Interventions: CBT, DBT, Solution Focused, Strength-based, Supportive, and Reframing  Therapist Response: 1:30 pm - 1:50 pm: Cln led group through DBT skill, 45678, as a grounding and emotion regulation strategy. Cln  encouraged pt's to utilize mindfulness mind  set during the practice.  1:50 - 2:00 pm: Clinician led check-out. Clinician assessed for immediate needs, medication compliance and efficacy, and safety concerns?  Summary: 1:30 pm - 1:50 pm: Pt participated.  1:50 - 2:00 pm: At check-out, patient reports no immediate concerns. Patient demonstrates progress as evidenced by continued engagement and responsiveness to treatment. Patient denies SI/HI/self-harm thoughts at the end of group.    Suicidal/Homicidal: Nowithout intent/plan  Plan: ?Pt will continue in PHP and medication management while continuing to work on decreasing depression symptoms,?SI, and anxiety symptoms,?and increasing the ability to self manage symptoms.   Collaboration of Care: Medication Management AEB Staci Kerns NP  Patient/Guardian was advised Release of Information must be obtained prior to any record release in order to collaborate their care with an outside provider. Patient/Guardian was advised if they have not already done so to contact the registration department to sign all necessary forms in order for us  to release information regarding their care.   Consent: Patient/Guardian gives verbal consent for treatment and assignment of benefits for services provided during this visit. Patient/Guardian expressed understanding and agreed to proceed.   Diagnosis: Schizophrenia, paranoid (HCC) [F20.0]    1. Schizophrenia, paranoid (HCC)       Randall Bastos, LCSW

## 2024-08-10 ENCOUNTER — Ambulatory Visit (HOSPITAL_COMMUNITY): Admitting: Psychiatry

## 2024-08-11 ENCOUNTER — Other Ambulatory Visit (HOSPITAL_COMMUNITY): Payer: Self-pay | Admitting: *Deleted

## 2024-08-11 DIAGNOSIS — F4310 Post-traumatic stress disorder, unspecified: Secondary | ICD-10-CM

## 2024-08-11 DIAGNOSIS — F4010 Social phobia, unspecified: Secondary | ICD-10-CM

## 2024-08-11 DIAGNOSIS — F200 Paranoid schizophrenia: Secondary | ICD-10-CM

## 2024-08-11 DIAGNOSIS — R251 Tremor, unspecified: Secondary | ICD-10-CM

## 2024-08-11 MED ORDER — PAROXETINE HCL 20 MG PO TABS: 20.0000 mg | ORAL_TABLET | Freq: Every day | ORAL | 0 refills | Status: DC

## 2024-08-16 ENCOUNTER — Other Ambulatory Visit (HOSPITAL_COMMUNITY): Payer: Self-pay

## 2024-08-16 DIAGNOSIS — R251 Tremor, unspecified: Secondary | ICD-10-CM

## 2024-08-16 DIAGNOSIS — F200 Paranoid schizophrenia: Secondary | ICD-10-CM

## 2024-08-16 MED ORDER — RISPERIDONE 3 MG PO TABS: 3.0000 mg | ORAL_TABLET | Freq: Two times a day (BID) | ORAL | 0 refills | Status: DC

## 2024-08-17 ENCOUNTER — Ambulatory Visit (INDEPENDENT_AMBULATORY_CARE_PROVIDER_SITE_OTHER): Payer: Self-pay | Admitting: Family Medicine

## 2024-08-17 DIAGNOSIS — Z23 Encounter for immunization: Secondary | ICD-10-CM

## 2024-08-17 DIAGNOSIS — F200 Paranoid schizophrenia: Secondary | ICD-10-CM

## 2024-08-17 DIAGNOSIS — Z13220 Encounter for screening for lipoid disorders: Secondary | ICD-10-CM

## 2024-08-17 DIAGNOSIS — F419 Anxiety disorder, unspecified: Secondary | ICD-10-CM

## 2024-08-17 DIAGNOSIS — E291 Testicular hypofunction: Secondary | ICD-10-CM

## 2024-08-17 DIAGNOSIS — J4520 Mild intermittent asthma, uncomplicated: Secondary | ICD-10-CM

## 2024-08-17 LAB — LIPID PANEL
Cholesterol: 138 mg/dL (ref 0–200)
HDL: 30.4 mg/dL — ABNORMAL LOW (ref >39.00)
LDL Cholesterol: 71 mg/dL (ref 0–99)
NonHDL: 107.94
Total CHOL/HDL Ratio: 5
Triglycerides: 183 mg/dL — ABNORMAL HIGH (ref 0.0–149.0)
VLDL: 36.6 mg/dL (ref 0.0–40.0)

## 2024-08-17 LAB — COMPREHENSIVE METABOLIC PANEL WITH GFR
ALT: 27 U/L (ref 0–53)
AST: 21 U/L (ref 0–37)
Albumin: 4.3 g/dL (ref 3.5–5.2)
Alkaline Phosphatase: 43 U/L (ref 39–117)
BUN: 15 mg/dL (ref 6–23)
CO2: 27 meq/L (ref 19–32)
Calcium: 9.5 mg/dL (ref 8.4–10.5)
Chloride: 105 meq/L (ref 96–112)
Creatinine, Ser: 0.95 mg/dL (ref 0.40–1.50)
GFR: 109.98 mL/min (ref >60.00)
Glucose, Bld: 95 mg/dL (ref 70–99)
Potassium: 3.8 meq/L (ref 3.5–5.1)
Sodium: 141 meq/L (ref 135–145)
Total Bilirubin: 0.7 mg/dL (ref 0.2–1.2)
Total Protein: 7 g/dL (ref 6.0–8.3)

## 2024-08-17 LAB — CBC
HCT: 42.9 % (ref 39.0–52.0)
Hemoglobin: 14.1 g/dL (ref 13.0–17.0)
MCHC: 32.9 g/dL (ref 30.0–36.0)
MCV: 74.7 fl — ABNORMAL LOW (ref 78.0–100.0)
Platelets: 198 K/uL (ref 150.0–400.0)
RBC: 5.74 Mil/uL (ref 4.22–5.81)
RDW: 16.1 % — ABNORMAL HIGH (ref 11.5–15.5)
WBC: 5.6 K/uL (ref 4.0–10.5)

## 2024-08-17 LAB — TSH: TSH: 1.85 u[IU]/mL (ref 0.35–5.50)

## 2024-08-17 LAB — HEMOGLOBIN A1C: Hgb A1c MFr Bld: 5.2 % (ref 4.6–6.5)

## 2024-08-17 MED ORDER — METFORMIN HCL ER 500 MG PO TB24: 500.0000 mg | ORAL_TABLET | Freq: Every day | ORAL | 3 refills | Status: AC

## 2024-08-17 NOTE — Patient Instructions (Signed)
 It was very nice to see you today!  VISIT SUMMARY: Today, you came in for your annual physical exam and medication refills. We discussed your current medications and overall health, including your plans to travel to [place]  for Thanksgiving.  YOUR PLAN: MORBID OBESITY: You are managing your weight with metformin  and a healthy diet. -Continue taking metformin  500 mg daily. -Blood work has been ordered to check your A1c levels.  SCHIZOPHRENIA: Your schizophrenia is being managed with Risperdal  and Thorazine . -Continue taking Risperdal  8 mg daily and Thorazine  100 mg nightly. -Continue working with your mental health specialist.  ANXIETY DISORDER: Your anxiety disorder was noted.  GENERAL HEALTH MAINTENANCE: Routine health maintenance was discussed. -You received your flu and pneumonia vaccines today. -Blood work has been ordered.  Return in about 1 year (around 08/17/2025) for Annual Physical.   Take care, Dr Kennyth  PLEASE NOTE:  If you had any lab tests, please let us  know if you have not heard back within a few days. You may see your results on mychart before we have a chance to review them but we will give you a call once they are reviewed by us .   If we ordered any referrals today, please let us  know if you have not heard from their office within the next week.   If you had any urgent prescriptions sent in today, please check with the pharmacy within an hour of our visit to make sure the prescription was transmitted appropriately.   Please try these tips to maintain a healthy lifestyle:  Eat at least 3 REAL meals and 1-2 snacks per day.  Aim for no more than 5 hours between eating.  If you eat breakfast, please do so within one hour of getting up.   Each meal should contain half fruits/vegetables, one quarter protein, and one quarter carbs (no bigger than a computer mouse)  Cut down on sweet beverages. This includes juice, soda, and sweet tea.   Drink at least 1 glass of  water with each meal and aim for at least 8 glasses per day  Exercise at least 150 minutes every week.    Preventive Care 27-18 Years Old, Male Preventive care refers to lifestyle choices and visits with your health care provider that can promote health and wellness. Preventive care visits are also called wellness exams. What can I expect for my preventive care visit? Counseling During your preventive care visit, your health care provider may ask about your: Medical history, including: Past medical problems. Family medical history. Current health, including: Emotional well-being. Home life and relationship well-being. Sexual activity. Lifestyle, including: Alcohol, nicotine  or tobacco, and drug use. Access to firearms. Diet, exercise, and sleep habits. Safety issues such as seatbelt and bike helmet use. Sunscreen use. Work and work astronomer. Physical exam Your health care provider may check your: Height and weight. These may be used to calculate your BMI (body mass index). BMI is a measurement that tells if you are at a healthy weight. Waist circumference. This measures the distance around your waistline. This measurement also tells if you are at a healthy weight and may help predict your risk of certain diseases, such as type 2 diabetes and high blood pressure. Heart rate and blood pressure. Body temperature. Skin for abnormal spots. What immunizations do I need?  Vaccines are usually given at various ages, according to a schedule. Your health care provider will recommend vaccines for you based on your age, medical history, and lifestyle or other factors,  such as travel or where you work. What tests do I need? Screening Your health care provider may recommend screening tests for certain conditions. This may include: Lipid and cholesterol levels. Diabetes screening. This is done by checking your blood sugar (glucose) after you have not eaten for a while (fasting). Hepatitis B  test. Hepatitis C test. HIV (human immunodeficiency virus) test. STI (sexually transmitted infection) testing, if you are at risk. Talk with your health care provider about your test results, treatment options, and if necessary, the need for more tests. Follow these instructions at home: Eating and drinking  Eat a healthy diet that includes fresh fruits and vegetables, whole grains, lean protein, and low-fat dairy products. Drink enough fluid to keep your urine pale yellow. Take vitamin and mineral supplements as recommended by your health care provider. Do not drink alcohol if your health care provider tells you not to drink. If you drink alcohol: Limit how much you have to 0-2 drinks a day. Know how much alcohol is in your drink. In the [place] one drink equals one 12 oz bottle of beer (355 mL), one 5 oz glass of wine (148 mL), or one 1 oz glass of hard liquor (44 mL). Lifestyle Brush your teeth every morning and night with fluoride toothpaste. Floss one time each day. Exercise for at least 30 minutes 5 or more days each week. Do not use any products that contain nicotine  or tobacco. These products include cigarettes, chewing tobacco, and vaping devices, such as e-cigarettes. If you need help quitting, ask your health care provider. Do not use drugs. If you are sexually active, practice safe sex. Use a condom or other form of protection to prevent STIs. Find healthy ways to manage stress, such as: Meditation, yoga, or listening to music. Journaling. Talking to a trusted person. Spending time with friends and family. Minimize exposure to UV radiation to reduce your risk of skin cancer. Safety Always wear your seat belt while driving or riding in a vehicle. Do not drive: If you have been drinking alcohol. Do not ride with someone who has been drinking. If you have been using any mind-altering substances or drugs. While texting. When you are tired or distracted. Wear a helmet and  other protective equipment during sports activities. If you have firearms in your house, make sure you follow all gun safety procedures. Seek help if you have been physically or sexually abused. What's next? Go to your health care provider once a year for an annual wellness visit. Ask your health care provider how often you should have your eyes and teeth checked. Stay up to date on all vaccines. This information is not intended to replace advice given to you by your health care provider. Make sure you discuss any questions you have with your health care provider. Document Revised: 03/19/2021 Document Reviewed: 03/19/2021 Elsevier Patient Education  2024 [employer].

## 2024-08-17 NOTE — Assessment & Plan Note (Signed)
Stable on albuterol as needed.  Does not need refill today.

## 2024-08-17 NOTE — Assessment & Plan Note (Signed)
 Following with psychiatry.  He is on Paxil  20 mg daily and Risperdal  3 mg twice daily

## 2024-08-17 NOTE — Progress Notes (Signed)
   Matthew Padilla is a 28 y.o. male who presents today for an office visit.  Assessment/Plan:  Chronic Problems Addressed Today: Morbid obesity (HCC) He is working on lifestyle changes including exercise and diet.  He is down about 5 pounds since last year.  Currently on metformin  500 mg daily tolerating well.  We did discuss switching to GLP agonist however he would like to hold off on this for now.  Will check labs today.  Follow-up in 1 year.  Hypogonadism in male Following with endocrinology.  On Clomid  50 mg daily  Schizophrenia, paranoid [facility]) Following with psychiatry.  He is on Paxil  20 mg daily and Risperdal  3 mg twice daily  Asthma Stable on albuterol  as needed.  Does not need refill today.   Preventative health care-flu and pneumonia vaccine given today.  Check labs.    Subjective:  HPI:  See assessment / plan for status of chronic conditions.  Patient here today for annual follow-up.  Doing well.  No acute concerns.  He would like to have his vaccines updated.  He has been following with psychiatry since her last visit and overall mental health is currently stable.  He is currently on metformin  for weight loss which seems to be working well.  Would like a refill on this today.        Objective:  Physical Exam: BP 120/82   Pulse 89   Temp 98 F (36.7 C) (Temporal)   Ht 6' 5 (1.956 m)   Wt (!) 335 lb 6.4 oz (152.1 kg)   SpO2 99%   BMI 39.77 kg/m   Wt Readings from Last 3 Encounters:  08/17/24 (!) 335 lb 6.4 oz (152.1 kg)  03/01/24 (!) 319 lb (144.7 kg)  09/15/23 (!) 330 lb (149.7 kg)    Gen: No acute distress, resting comfortably CV: Regular rate and rhythm with no murmurs appreciated Pulm: Normal work of breathing, clear to auscultation bilaterally with no crackles, wheezes, or rhonchi Neuro: Grossly normal, moves all extremities Psych: Normal affect and thought content      [name], MD 08/17/2024 11:09 AM

## 2024-08-17 NOTE — Assessment & Plan Note (Signed)
 Following with endocrinology.  On Clomid  50 mg daily

## 2024-08-17 NOTE — Assessment & Plan Note (Signed)
 He is working on lifestyle changes including exercise and diet.  He is down about 5 pounds since last year.  Currently on metformin  500 mg daily tolerating well.  We did discuss switching to GLP agonist however he would like to hold off on this for now.  Will check labs today.  Follow-up in 1 year.

## 2024-08-22 ENCOUNTER — Ambulatory Visit: Payer: Self-pay | Admitting: Family Medicine

## 2024-08-22 NOTE — Progress Notes (Signed)
 his triglycerides were a little elevated but all of his other labs are at goal.  Do not need to make any changes to treatment plan at this time.  He should continue to work on diet and exercise and we can recheck everything

## 2024-09-04 ENCOUNTER — Encounter: Payer: Self-pay | Admitting: Internal Medicine

## 2024-09-04 ENCOUNTER — Other Ambulatory Visit

## 2024-09-04 ENCOUNTER — Ambulatory Visit: Admitting: Internal Medicine

## 2024-09-04 VITALS — BP 144/98 | Ht 77.0 in | Wt 339.0 lb

## 2024-09-04 DIAGNOSIS — E221 Hyperprolactinemia: Secondary | ICD-10-CM | POA: Diagnosis not present

## 2024-09-04 DIAGNOSIS — R0683 Snoring: Secondary | ICD-10-CM | POA: Diagnosis not present

## 2024-09-04 DIAGNOSIS — E291 Testicular hypofunction: Secondary | ICD-10-CM

## 2024-09-04 NOTE — Progress Notes (Unsigned)
 Name: Matthew Padilla  MRN/ DOB: [id number], Feb 15, 1996    Age/ Sex: 28 y.o., male    PCP: Kennyth Worth HERO, MD   Reason for Endocrinology Evaluation: Hypogonadism      Date of Initial Endocrinology Evaluation: 01/14/2021    HPI: Matthew Padilla is a 28 y.o. male with a past medical history of Depression . The patient presented for initial endocrinology clinic visit on 01/14/2021 for consultative assistance with his hypogonadism .    He presented to his PCP in 10/2020  For evaluation of severe depression, lack of motivation and mood changes. Mother asked for evaluation for klinefelter syndrome  Which prompted lab work showing normal TSH 1.98 uIU/mL , low total testosterone  at 204 ng/dL ( reference  [id number])  but normal free testosterone  14.4 pg/dL ( reference 0.6-73.4) normal LH and FSH at 6.7 and 1.5 respectively.    Of note the pt was first noted with depression when he entered [place]  [employer] at age 49 which resulted in expelling him due to the fact that he stopped going to classes. He was not treated for depression until a couple years later when he was admitted for in-patient facility, pt was non compliant with anti depressants after discharge.     Moved from [place]  ~2021 No learning difficulties  He went through puberty at age 75     Has two sisters    Was lost to follow-up from 2022 until his return to our office December 2023   24-hour urinary cortisol was normal at 43.1 mcg, total testosterone  low at 115 NG/DL, prolactin slightly elevated at 22.3 NG/mL, with inappropriately normal LH 2.57 mIU/mL and normal TFTs   MRI of the brain showed normal pituitary gland 10/2022  Patient started on clomiphene  10/2022  Patient follows with psychiatry for paranoid schizophrenia  SUBJECTIVE:    Today (09/04/24):  Matthew Padilla is here for further evaluation of hypogonadism. He is accompanied by his Father  today  Patient continues to follow-up with psychiatry for  paranoid schizophrenia, patient on risperidone  Energy is fluctuating  Sleeps well at night , patient has been noted with snoring , wakes up tired  Motivation is stable  Facial hair is stable  No headaches  No nipple discharge  Has recently started walking  Clomiphene  50 mg daily  HISTORY:  Past Medical History:  Past Medical History:  Diagnosis Date   Anxiety    Low testosterone  in male    Paranoid schizophrenia (HCC)    Schizophrenia (HCC)    Past Surgical History:   Social History:  reports that he has never smoked. He has never used smokeless tobacco. He reports that he does not currently use alcohol. He reports current drug use. Drug: Other-see comments. Family History: family history is not on file.   HOME MEDICATIONS: Allergies as of 09/04/2024   No Known Allergies      Medication List        Accurate as of September 04, 2024  1:25 PM. If you have any questions, ask your nurse or doctor.          albuterol  108 (90 Base) MCG/ACT inhaler Commonly known as: VENTOLIN  HFA Inhale 2 puffs into the lungs every 6 (six) hours as needed for wheezing or shortness of breath.   clomiPHENE  50 MG tablet Commonly known as: CLOMID  Take 1 tablet (50 mg total) by mouth daily.   hydrOXYzine  25 MG tablet Commonly known as: ATARAX  Take 1 tablet (25 mg total) by mouth at bedtime  and may repeat dose one time if needed.   metFORMIN  500 MG 24 hr tablet Commonly known as: GLUCOPHAGE -XR Take 1 tablet (500 mg total) by mouth daily with breakfast.   PARoxetine  20 MG tablet Commonly known as: PAXIL  Take 1 tablet (20 mg total) by mouth daily.   risperiDONE  3 MG tablet Commonly known as: RISPERDAL  Take 1 tablet (3 mg total) by mouth 2 (two) times daily.          REVIEW OF SYSTEMS: A comprehensive ROS was conducted with the patient and is negative except as per HPI     OBJECTIVE:  VS: BP (!) 144/98   Ht 6' 5 (1.956 m)   Wt (!) 339 lb (153.8 kg)   BMI 40.20 kg/m      Wt Readings from Last 3 Encounters:  09/04/24 (!) 339 lb (153.8 kg)  08/17/24 (!) 335 lb 6.4 oz (152.1 kg)  03/01/24 (!) 319 lb (144.7 kg)     EXAM: General:  NAD Normal facial hair for age/sex  Lungs: Clear with good BS bilat   Heart: Auscultation: RRR.  Extremities:  BL LE: No pretibial edema normal   Mental Status: Judgment, insight: Intact     DATA REVIEWED:   Latest Reference Range & Units 09/04/24 13:49  Sodium 135 - 146 mmol/L 141  Potassium 3.5 - 5.3 mmol/L 4.2  Chloride 98 - 110 mmol/L 107  CO2 20 - 32 mmol/L 24  Glucose 65 - 99 mg/dL 79  BUN 7 - 25 mg/dL 14  Creatinine 9.39 - 8.75 mg/dL 9.16  Calcium 8.6 - 89.6 mg/dL 9.5  BUN/Creatinine Ratio 6 - 22 (calc) SEE NOTE:  eGFR > OR = 60 mL/min/1.66m2 123    Latest Reference Range & Units 09/04/24 13:49  Prolactin 2.0 - 18.0 ng/mL 22.2 (H)  Glucose 65 - 99 mg/dL 79  TSH 9.59 - 5.49 mIU/L 0.95  T4,Free(Direct) 0.8 - 1.8 ng/dL 1.3  (H): Data is abnormally high    Latest Reference Range & Units 12/27/23 08:18  LH 1.5 - 9.3 mIU/mL 14.2 (H)  Prolactin 2.0 - 18.0 ng/mL 25.5 (H)  Testosterone , Total, LC-MS-MS 250 - 1,100 ng/dL 345  TSH 9.59 - 5.49 mIU/L 2.72  T4,Free(Direct) 0.8 - 1.8 ng/dL 1.5     MRI brain 8/81/7975   EXAM: MRI HEAD WITHOUT CONTRAST   TECHNIQUE: Multiplanar, multiecho pulse sequences of the brain and surrounding structures were obtained without intravenous contrast.   COMPARISON:  None Available.   FINDINGS: Brain: Normal size and shape of the pituitary gland. Normal appearance of the infundibulum, suprasellar cistern, and cavernous sinus region.   No infarct, hemorrhage, hydrocephalus, or collection. No white matter disease   Vascular: Major flow voids are preserved   Skull and upper cervical spine: Normal marrow signal   Sinuses/Orbits: Negative   IMPRESSION: Normal noncontrast MRI of the brain and pituitary gland.    ASSESSMENT/PLAN/RECOMMENDATIONS:    Hypogonadism:  -Idiopathic -Pituitary MRI was normal -He was started on clomiphene  in January, 2024, initially there was some imperfect adherence, but he recently has been more compliant, testosterone  has responded well - Testosterone  is pending today - BMP, TFTs are normal  Medication Continue clomiphene  50 mg daily   2.  Hyperprolactinemia:  -Asymptomatic -This is secondary to risperidone  -No intervention   3.  Snoring   - Referral for pulmonary has been placed for further evaluation of OSA  Follow-up in 6 months    Signed electronically by: Stefano Redgie Butts, MD  [facility]  [facility] [address], [address] [place], [place] [address] Phone: [phone] FAX: [phone]   CC: Kennyth Worth HERO, MD [address] [place] [address] Phone: [phone] Fax: [phone]   Return to Endocrinology clinic as below: Future Appointments  Date Time Provider Department Center  09/07/2024  9:40 AM Arfeen, Leni DASEN, MD [facility] None  11/13/2024 10:40 AM Kennyth Worth HERO, MD [facility]

## 2024-09-05 ENCOUNTER — Ambulatory Visit: Payer: Self-pay | Admitting: Internal Medicine

## 2024-09-05 MED ORDER — CLOMIPHENE CITRATE 50 MG PO TABS: 50.0000 mg | ORAL_TABLET | Freq: Every day | ORAL | 3 refills | Status: AC

## 2024-09-07 ENCOUNTER — Encounter (HOSPITAL_COMMUNITY): Payer: Self-pay | Admitting: Psychiatry

## 2024-09-07 ENCOUNTER — Other Ambulatory Visit: Payer: Self-pay

## 2024-09-07 ENCOUNTER — Ambulatory Visit (HOSPITAL_COMMUNITY): Admitting: Psychiatry

## 2024-09-07 VITALS — BP 140/89 | HR 98 | Ht 77.0 in | Wt 334.0 lb

## 2024-09-07 DIAGNOSIS — F4310 Post-traumatic stress disorder, unspecified: Secondary | ICD-10-CM

## 2024-09-07 DIAGNOSIS — F4010 Social phobia, unspecified: Secondary | ICD-10-CM | POA: Diagnosis not present

## 2024-09-07 DIAGNOSIS — R251 Tremor, unspecified: Secondary | ICD-10-CM

## 2024-09-07 DIAGNOSIS — F200 Paranoid schizophrenia: Secondary | ICD-10-CM

## 2024-09-07 LAB — T4, FREE: Free T4: 1.3 ng/dL (ref 0.8–1.8)

## 2024-09-07 LAB — BASIC METABOLIC PANEL WITH GFR
BUN: 14 mg/dL (ref 7–25)
CO2: 24 mmol/L (ref 20–32)
Calcium: 9.5 mg/dL (ref 8.6–10.3)
Chloride: 107 mmol/L (ref 98–110)
Creat: 0.83 mg/dL (ref 0.60–1.24)
Glucose, Bld: 79 mg/dL (ref 65–99)
Potassium: 4.2 mmol/L (ref 3.5–5.3)
Sodium: 141 mmol/L (ref 135–146)
eGFR: 123 mL/min/1.73m2 (ref >=60)

## 2024-09-07 LAB — TSH: TSH: 0.95 m[IU]/L (ref 0.40–4.50)

## 2024-09-07 LAB — TESTOSTERONE, TOTAL, LC/MS/MS: Testosterone, Total, LC-MS-MS: 765 ng/dL (ref 250–1100)

## 2024-09-07 LAB — PROLACTIN: Prolactin: 22.2 ng/mL — ABNORMAL HIGH (ref 2.0–18.0)

## 2024-09-07 MED ORDER — HYDROXYZINE HCL 25 MG PO TABS: 25.0000 mg | ORAL_TABLET | Freq: Every evening | ORAL | 1 refills | Status: AC

## 2024-09-07 MED ORDER — RISPERIDONE 3 MG PO TABS: 3.0000 mg | ORAL_TABLET | Freq: Two times a day (BID) | ORAL | 1 refills | Status: AC

## 2024-09-07 MED ORDER — PAROXETINE HCL 30 MG PO TABS: 30.0000 mg | ORAL_TABLET | Freq: Every day | ORAL | 1 refills | Status: AC
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
## 2024-09-10 ENCOUNTER — Other Ambulatory Visit (HOSPITAL_COMMUNITY): Payer: Self-pay | Admitting: Psychiatry

## 2024-09-10 DIAGNOSIS — F4010 Social phobia, unspecified: Secondary | ICD-10-CM

## 2024-09-10 DIAGNOSIS — F4310 Post-traumatic stress disorder, unspecified: Secondary | ICD-10-CM

## 2024-09-10 DIAGNOSIS — R251 Tremor, unspecified: Secondary | ICD-10-CM

## 2024-09-10 DIAGNOSIS — F200 Paranoid schizophrenia: Secondary | ICD-10-CM

## 2024-10-09 ENCOUNTER — Other Ambulatory Visit (HOSPITAL_COMMUNITY): Payer: Self-pay | Admitting: Psychiatry

## 2024-10-09 DIAGNOSIS — R251 Tremor, unspecified: Secondary | ICD-10-CM

## 2024-10-09 DIAGNOSIS — F4310 Post-traumatic stress disorder, unspecified: Secondary | ICD-10-CM

## 2024-10-09 DIAGNOSIS — F200 Paranoid schizophrenia: Secondary | ICD-10-CM

## 2024-10-09 DIAGNOSIS — F4010 Social phobia, unspecified: Secondary | ICD-10-CM

## 2024-10-23 ENCOUNTER — Encounter: Payer: Self-pay | Admitting: Pulmonary Disease

## 2024-10-23 ENCOUNTER — Ambulatory Visit: Admitting: Pulmonary Disease

## 2024-10-23 VITALS — BP 136/81 | HR 93 | Temp 98.2°F | Ht 77.0 in | Wt 337.0 lb

## 2024-10-23 DIAGNOSIS — E669 Obesity, unspecified: Secondary | ICD-10-CM

## 2024-10-23 DIAGNOSIS — R519 Headache, unspecified: Secondary | ICD-10-CM | POA: Diagnosis not present

## 2024-10-23 DIAGNOSIS — R0683 Snoring: Secondary | ICD-10-CM

## 2024-10-23 DIAGNOSIS — G478 Other sleep disorders: Secondary | ICD-10-CM

## 2024-10-23 DIAGNOSIS — Z8659 Personal history of other mental and behavioral disorders: Secondary | ICD-10-CM | POA: Diagnosis not present

## 2024-10-23 NOTE — Progress Notes (Signed)
 "              Matthew Padilla    [id number]    Aug 03, 1996  Primary Care Physician:Parker, Worth HERO, MD  Referring Physician: Sam Donell Cardinal, MD [address] [place],  [place] [address]  Chief complaint:   Nonrestorative sleep, multiple awakenings  Discussed the use of AI scribe software for clinical note transcription with the patient, who gave verbal consent to proceed.  History of Present Illness Matthew Padilla is a 29 year old male who presents with concerns about snoring and potential sleep apnea.  He has been experiencing snoring for several years, accompanied by dryness of the mouth in the mornings. He denies any episodes of apnea during sleep, headaches, or night sweats. His memory is described as 'fairly good', and he has not experienced any incidents of sleepy driving.  His asthma is well controlled. He has recently lost forty pounds through exercise and the use of metformin . He typically goes to bed around 11:30 PM and falls asleep without difficulty, although he wakes up a few times at night. He sometimes has trouble returning to sleep after waking and acknowledges tossing and turning during sleep.  Family history is notable for his mother also experiencing snoring.  Usually tries to go to bed between 1030 and 1130 Takes about 20 minutes to fall asleep 1-2 awakenings Final wake up time about 9 AM Has lost about 40 pounds recently from dry and being on metformin   Admits to some dryness of his mouth in the morning No morning headaches No night sweats Memory is good Focus is good  Non-smoker  Outpatient Encounter Medications as of 10/23/2024  Medication Sig   albuterol  (VENTOLIN  HFA) 108 (90 Base) MCG/ACT inhaler Inhale 2 puffs into the lungs every 6 (six) hours as needed for wheezing or shortness of breath.   clomiPHENE  (CLOMID ) 50 MG tablet Take 1 tablet (50 mg total) by mouth daily.   hydrOXYzine  (ATARAX ) 25 MG tablet Take 1 tablet (25 mg  total) by mouth at bedtime.   metFORMIN  (GLUCOPHAGE -XR) 500 MG 24 hr tablet Take 1 tablet (500 mg total) by mouth daily with breakfast.   PARoxetine  (PAXIL ) 30 MG tablet Take 1 tablet (30 mg total) by mouth daily.   risperiDONE  (RISPERDAL ) 3 MG tablet Take 1 tablet (3 mg total) by mouth 2 (two) times daily.   No facility-administered encounter medications on file as of 10/23/2024.    Allergies as of 10/23/2024   (No Known Allergies)    Past Medical History:  Diagnosis Date   Anxiety    Low testosterone  in male    Paranoid schizophrenia (HCC)    Schizophrenia (HCC)     Past Surgical History:  Procedure Laterality Date   LASIK Bilateral 2018    History reviewed. No pertinent family history.  Social History   Socioeconomic History   Marital status: Single    Spouse name: Not on file   Number of children: 0   Years of education: Not on file   Highest education level: 12th grade  Occupational History   Not on file  Tobacco Use   Smoking status: Never   Smokeless tobacco: Never  Vaping Use   Vaping status: Never Used  Substance and Sexual Activity   Alcohol use: Not Currently   Drug use: Yes    Types: Other-see comments   Sexual activity: Never  Other Topics Concern   Not on file  Social History Narrative   Not on  file   Social Drivers of Health   Tobacco Use: Low Risk (10/23/2024)   Patient History    Smoking Tobacco Use: Never    Smokeless Tobacco Use: Never    Passive Exposure: Not on file  Financial Resource Strain: High Risk (08/16/2024)   Overall Financial Resource Strain (CARDIA)    Difficulty of Paying Living Expenses: Very hard  Food Insecurity: No Food Insecurity (08/16/2024)   Epic    Worried About Programme Researcher, Broadcasting/film/video in the Last Year: Never true    Ran Out of Food in the Last Year: Never true  Transportation Needs: No Transportation Needs (08/16/2024)   Epic    Lack of Transportation (Medical): No    Lack of Transportation (Non-Medical): No   Physical Activity: Sufficiently Active (08/16/2024)   Exercise Vital Sign    Days of Exercise per Week: 4 days    Minutes of Exercise per Session: 40 min  Stress: Stress Concern Present (08/16/2024)   [employer] of Occupational Health - Occupational Stress Questionnaire    Feeling of Stress: To some extent  Social Connections: Moderately Isolated (08/16/2024)   Social Connection and Isolation Panel    Frequency of Communication with Friends and Family: More than three times a week    Frequency of Social Gatherings with Friends and Family: More than three times a week    Attends Religious Services: 1 to 4 times per year    Active Member of [employer] or Organizations: No    Attends Engineer, Structural: Not on file    Marital Status: Never married  Intimate Partner Violence: Patient Declined (04/19/2023)   Humiliation, Afraid, Rape, and Kick questionnaire    Fear of Current or Ex-Partner: Patient declined    Emotionally Abused: Patient declined    Physically Abused: Patient declined    Sexually Abused: Patient declined  Depression (PHQ2-9): Low Risk (08/17/2024)   Depression (PHQ2-9)    PHQ-2 Score: 0  Recent Concern: Depression (PHQ2-9) - High Risk (07/18/2024)   Depression (PHQ2-9)    PHQ-2 Score: 13  Alcohol Screen: Low Risk (04/19/2023)   Alcohol Screen    Last Alcohol Screening Score (AUDIT): 0  Housing: Unknown (08/16/2024)   Epic    Unable to Pay for Housing in the Last Year: Patient declined    Number of Times Moved in the Last Year: Not on file    Homeless in the Last Year: No  Utilities: Patient Declined (04/19/2023)   [facility] Utilities    Threatened with loss of utilities: Patient declined  Health Literacy: Not on file    Review of Systems  Respiratory:  Negative for shortness of breath.   Psychiatric/Behavioral:  Positive for sleep disturbance.     Vitals:   10/23/24 1514  BP: 136/81  Pulse: 93  Temp: 98.2 F (36.8 C)  SpO2: 94%     Physical  Exam Constitutional:      Appearance: He is obese.  HENT:     Head: Normocephalic.     Nose: No congestion.     Mouth/Throat:     Mouth: Mucous membranes are moist.  Eyes:     General: No scleral icterus. Cardiovascular:     Rate and Rhythm: Normal rate and regular rhythm.     Heart sounds: No murmur heard.    No friction rub.  Pulmonary:     Effort: No respiratory distress.     Breath sounds: No stridor. No wheezing or rhonchi.  Musculoskeletal:  General: No swelling. Normal range of motion.     Cervical back: No rigidity or tenderness.  Skin:    General: Skin is warm.  Neurological:     General: No focal deficit present.     Mental Status: He is alert.   Epworth Sleepiness Scale of 5  Data Reviewed: No previous sleep study on record  Assessment and Plan Assessment & Plan Suspected obstructive sleep apnea Chronic snoring with morning dry mouth, no reported apneas, headaches, or night sweats. Recent weight loss may improve symptoms. Differential includes sleep apnea versus other sleep disturbances. Home sleep study preferred due to insurance coverage and practicality. CPAP is first-line treatment if significant apnea is detected. Alternative treatments include oral devices and surgery for those intolerant to CPAP. - Ordered home sleep study to evaluate for obstructive sleep apnea. - If significant sleep apnea is detected, will initiate CPAP therapy. - Will consider referral to a dentist for oral device if CPAP is not tolerated. - Will discuss surgical options if CPAP and oral devices are not effective.  History of paranoid schizophrenia  Obesity  He has at least moderate probability of significant obstructive sleep apnea Agreeable to undergo a home sleep study  Encouraged ongoing weight loss  Pathophysiology of sleep disordered breathing discussed with the patient Treatment options discussed with the patient Will update him with results as soon as  reviewed   Orders Placed This Encounter  Procedures   Home sleep test    Standing Status:   Future    Expiration Date:   10/23/2025    Where should this test be performed::   [facility]      Jennet Epley MD [facility] 10/23/2024, 3:31 PM  CC: Shamleffer, Donell Cardinal, MD   "

## 2024-10-23 NOTE — Patient Instructions (Signed)
 I have scheduled you for a home sleep study We will call you to get the study done I will update you with results  Call us  with significant concerns  Tentative follow-up in about 3 months

## 2024-11-09 ENCOUNTER — Ambulatory Visit (HOSPITAL_COMMUNITY): Admitting: Psychiatry

## 2024-11-13 ENCOUNTER — Encounter: Payer: Self-pay | Admitting: Family Medicine

## 2024-11-16 ENCOUNTER — Ambulatory Visit (HOSPITAL_COMMUNITY): Admitting: Psychiatry

## 2025-01-31 ENCOUNTER — Ambulatory Visit: Admitting: Pulmonary Disease

## 2025-09-04 ENCOUNTER — Ambulatory Visit: Admitting: Internal Medicine
